# Patient Record
Sex: Male | Born: 1945
Health system: Southern US, Community
[De-identification: ages and names within clinical notes are randomized; demographics above are authoritative.]

## PROBLEM LIST (undated history)

## (undated) DIAGNOSIS — F419 Anxiety disorder, unspecified: Secondary | ICD-10-CM

## (undated) DIAGNOSIS — E785 Hyperlipidemia, unspecified: Secondary | ICD-10-CM

## (undated) DIAGNOSIS — R7303 Prediabetes: Secondary | ICD-10-CM

## (undated) DIAGNOSIS — K589 Irritable bowel syndrome without diarrhea: Secondary | ICD-10-CM

## (undated) DIAGNOSIS — K635 Polyp of colon: Secondary | ICD-10-CM

## (undated) DIAGNOSIS — M1990 Unspecified osteoarthritis, unspecified site: Secondary | ICD-10-CM

## (undated) DIAGNOSIS — K2970 Gastritis, unspecified, without bleeding: Secondary | ICD-10-CM

## (undated) DIAGNOSIS — J449 Chronic obstructive pulmonary disease, unspecified: Secondary | ICD-10-CM

## (undated) DIAGNOSIS — J45909 Unspecified asthma, uncomplicated: Secondary | ICD-10-CM

## (undated) DIAGNOSIS — K5730 Diverticulosis of large intestine without perforation or abscess without bleeding: Secondary | ICD-10-CM

## (undated) DIAGNOSIS — I10 Essential (primary) hypertension: Secondary | ICD-10-CM

## (undated) DIAGNOSIS — I6523 Occlusion and stenosis of bilateral carotid arteries: Secondary | ICD-10-CM

## (undated) HISTORY — PX: EYE SURGERY: SHX253

## (undated) HISTORY — DX: Anxiety disorder, unspecified: F41.9

## (undated) HISTORY — DX: Irritable bowel syndrome, unspecified: K58.9

## (undated) HISTORY — DX: Unspecified osteoarthritis, unspecified site: M19.90

## (undated) HISTORY — DX: Occlusion and stenosis of bilateral carotid arteries: I65.23

## (undated) HISTORY — PX: CATARACT EXTRACTION: SUR2

## (undated) HISTORY — DX: Prediabetes: R73.03

## (undated) HISTORY — PX: TONSILLECTOMY: SUR1361

## (undated) HISTORY — PX: ESOPHAGOGASTRODUODENOSCOPY: SHX1529

## (undated) HISTORY — DX: Polyp of colon: K63.5

## (undated) HISTORY — PX: OTHER SURGICAL HISTORY: SHX169

## (undated) HISTORY — DX: Chronic obstructive pulmonary disease, unspecified: J44.9

## (undated) HISTORY — PX: COLONOSCOPY: SHX174

## (undated) HISTORY — DX: Hyperlipidemia, unspecified: E78.5

## (undated) HISTORY — DX: Diverticulosis of large intestine without perforation or abscess without bleeding: K57.30

## (undated) HISTORY — DX: Gastritis, unspecified, without bleeding: K29.70

---

## 1967-09-30 HISTORY — PX: APPENDECTOMY: SHX54

## 2001-01-07 ENCOUNTER — Encounter: Admission: RE | Admit: 2001-01-07 | Discharge: 2001-01-21 | Payer: Self-pay | Admitting: Family Medicine

## 2001-01-27 HISTORY — PX: OTHER SURGICAL HISTORY: SHX169

## 2001-02-26 ENCOUNTER — Encounter (INDEPENDENT_AMBULATORY_CARE_PROVIDER_SITE_OTHER): Payer: Self-pay | Admitting: *Deleted

## 2001-02-26 ENCOUNTER — Ambulatory Visit (HOSPITAL_BASED_OUTPATIENT_CLINIC_OR_DEPARTMENT_OTHER): Admission: RE | Admit: 2001-02-26 | Discharge: 2001-02-26 | Payer: Self-pay | Admitting: Surgery

## 2005-08-29 HISTORY — PX: OTHER SURGICAL HISTORY: SHX169

## 2005-09-24 ENCOUNTER — Ambulatory Visit (HOSPITAL_COMMUNITY): Admission: RE | Admit: 2005-09-24 | Discharge: 2005-09-25 | Payer: Self-pay | Admitting: Specialist

## 2005-11-03 ENCOUNTER — Encounter: Admission: RE | Admit: 2005-11-03 | Discharge: 2006-01-05 | Payer: Self-pay | Admitting: Specialist

## 2008-01-19 ENCOUNTER — Ambulatory Visit: Payer: Self-pay | Admitting: Internal Medicine

## 2008-02-15 ENCOUNTER — Encounter: Payer: Self-pay | Admitting: Internal Medicine

## 2008-02-15 ENCOUNTER — Ambulatory Visit: Payer: Self-pay | Admitting: Internal Medicine

## 2008-02-18 ENCOUNTER — Encounter: Payer: Self-pay | Admitting: Internal Medicine

## 2008-07-21 ENCOUNTER — Ambulatory Visit: Payer: Self-pay | Admitting: Vascular Surgery

## 2008-08-04 ENCOUNTER — Ambulatory Visit: Payer: Self-pay | Admitting: Vascular Surgery

## 2008-08-16 ENCOUNTER — Ambulatory Visit: Payer: Self-pay | Admitting: Cardiology

## 2009-09-07 ENCOUNTER — Ambulatory Visit: Payer: Self-pay | Admitting: Vascular Surgery

## 2010-06-29 LAB — HM PAP SMEAR

## 2010-10-16 ENCOUNTER — Ambulatory Visit: Admit: 2010-10-16 | Payer: Self-pay | Admitting: Vascular Surgery

## 2011-02-07 ENCOUNTER — Encounter: Payer: Self-pay | Admitting: Family Medicine

## 2011-02-11 NOTE — Procedures (Signed)
CAROTID DUPLEX EXAM   INDICATION:  Carotid bruit, follow up known carotid artery disease.   HISTORY:  Diabetes:  No.  Cardiac:  No.  Hypertension:  No.  Smoking:  Yes.  Previous Surgery:  No.  CV History:  Bilateral carotid.  Previous duplex revealed 20-39% ICA  stenosis bilaterally.  Amaurosis Fugax No, Paresthesias No, Hemiparesis No.                                       RIGHT             LEFT  Brachial systolic pressure:         136               146  Brachial Doppler waveforms:         Biphasic          Triphasic  Vertebral direction of flow:        To and fro        Antegrade  DUPLEX VELOCITIES (cm/sec)  CCA peak systolic                   54                136  ECA peak systolic                   64                121  ICA peak systolic                   <40*              88  ICA end diastolic                   <40               31  PLAQUE MORPHOLOGY:                  Soft              Calcified  PLAQUE AMOUNT:                      Severe            Mild  PLAQUE LOCATION:                    Innominate artery Proximal ICA   IMPRESSION:  1. 407 cm/s peak systolic velocity in the innominate.  2. *The right internal carotid artery has diminished peak systolic      flow.  The right common carotid artery is turbulent.  There is a      left > right brachial gradient, which is consistent with an      innominate artery stenosis.  3. 20-39% left internal carotid artery stenosis.  4. 20-39% right internal carotid artery stenosis.   Dr. Kathi Der office was notified of the preliminary results, and the  patient was set up to see a physician at [facility].   ___________________________________________  Quita Skye Hart [place], M.D.   MC/MEDQ  D:  07/21/2008  T:  07/21/2008  Job:  [id number]

## 2011-02-11 NOTE — Consult Note (Signed)
NEW PATIENT CONSULTATION   Joshua Bowman, Joshua Bowman  DOB:  [birth year]                                       08/04/2008  FIEPP#:[id number]   The patient presents today for evaluation of abnormal extracranial  duplex evaluation.  He is an otherwise healthy 65 year old gentleman who  was noted to have carotid bruit by Dr. Vernon Prey.  He underwent initial  duplex evaluation in our office on 03/26/2005 showing no significant  carotid stenosis.  He had a subsequent followup study on 07/21/2008 and  presents for discussion of continued abnormalities with this.  He  specifically denies any symptoms of amaurosis fugax, transient ischemic  attack, or stroke.  He does not have any posterior circulation symptoms  of dizziness and does not have any arm fatigue with exercise.  He does  have prior rotator cuff repair on the right and does have carpal tunnel  syndrome.   PAST MEDICAL HISTORY:  Significant for elevated cholesterol and COPD.  He is married with 1 child.  He works in Production designer, theatre/television/film.  He does smoke 1/2  pack of cigarettes a day.  Does not drink alcohol on a regular basis.   REVIEW OF SYSTEMS:  Weight is reported at 185.  He is 5 feet 6 inches  tall.  CARDIAC:  Negative.  PULMONARY:  Does have occasional bronchitis.  GASTROINTESTINAL:  Negative for symptoms.  GENITOURINARY:  Negative for symptoms.  ORTHO:  Significant for arthritic joint pain.  He has no psychiatric history.  VASCULAR:  No clotting disorders.   ALLERGIES:  Sulfa.   MEDICATIONS:  Lorazepam 0.5 mg p.o. as needed.  Crestor 20 mg daily.  81  mg aspirin daily.   PHYSICAL EXAM:  Well-developed, well-nourished white male appearing  stated age of 65.  Blood pressure is 148/92 in the left arm, 122/94 in  the right arm.  Pulse is 100.  Respirations 18.  He is grossly intact  neurologically.  he does have palpable radial pulses bilaterally.  Somewhat diminished on the right compared to the left.  Chest is clear.  Heart is regular rate and rhythm.  He does have a soft right carotid  bruit.  I do not appreciate a left carotid bruit.  His femoral,  popliteal, and posterior tibial pulses are 2+ bilaterally.  I do not  palpate any masses in his abdomen, specifically no aneurysm.   I reviewed his duplex with the patient and his family present.  This  appears to show a proximal stenosis, most likely in his innominate  artery.  He does not appear to have any evidence of stenosis in his  carotid arteries bilaterally.  His left carotid study is normal.  I had  a long discussion with the patient and his family.  I explained the  asymptomatic nature of his probable innominate stenosis, I would not  recommend any further followup.  I discussed symptoms of right arm  ischemia and right cerebral disease.  I will see him again in 1 year  with continued followup of this.  If he develops any symptoms, we would  proceed with arteriography for further evaluation.  He is comfortable  with this discussion and will see [place] again in 1 year.   Larina Earthly, M.D.  Electronically Signed   TFE/MEDQ  D:  08/04/2008  T:  08/07/2008  Job:  [id number]  cc:   Ernestina Penna, M.D.

## 2011-02-11 NOTE — Assessment & Plan Note (Signed)
[facility]                            CARDIOLOGY OFFICE NOTE   Bishoy, Cupp SHRIHAN PUTT                       MRN:          [id number]  DATE:08/16/2008                            DOB:          1945-10-19    REFERRING PHYSICIAN:  Ernestina Penna, M.D.   REASON FOR PRESENTATION:  Evaluate the patient with peripheral vascular  disease and multiple cardiovascular risk factors.   HISTORY OF PRESENT ILLNESS:  The patient is a 65 year old gentleman with  history of innominate dominant stenosis followed by Dr. Arbie Cookey.  He has  not ever had coronary artery disease.  His last stress test was in 2002.  Since that time, he has developed some knee problems and is not able to  ambulate on a treadmill.  He is able to do activity such as pushes  lawnmower which he did all summer.  With this, he does not get any chest  pressure, neck, or arm discomfort.  He does not have any palpitation,  presyncope, or syncope.  He gets short of breath with significant  activity, but not with moderate activity.  He does not have any resting  shortness of breath.  He denies any PND or orthopnea.  He is somewhat  limited by bilateral knee pain and so does not exercise routinely.  He  smokes cigarettes.   PAST MEDICAL HISTORY:  Hyperlipidemia, peripheral vascular disease  (innominate stenosis followed by Dr. Arbie Cookey), benign prostatic  hypertrophy, colonic polyps, gastritis, right rotator cuff tear, and  hemorrhoids.   PAST SURGICAL HISTORY:  Appendectomy, nasal surgery, lipoma resected,  and right rotator cuff repair.   ALLERGIES:  SULFA.   MEDICATIONS:  1. Aspirin 81 mg daily.  2. Lorazepam.  3. Vicodin.  4. Crestor 20 mg daily.  5. Voltaren (of note, the patient was given Zetia and has not started      this.  He was not taking his Crestor routinely actually).   SOCIAL HISTORY:  The patient works in Insurance account manager.  He is married.  He  has 1 child.  He has been smoking half pack per  day at least for 20  years.  Quit for a while and actually smoked before this as well.   FAMILY HISTORY:  Noncontributory for early coronary artery disease.  His  mother had arrhythmia and died with a CVA at age 32.   REVIEW OF SYSTEMS:  As stated in the HPI and positive for partial  dentures, apparent early emphysema, reflux, and joint pains.  Negative  for all other systems.   PHYSICAL EXAMINATION:  GENERAL:  The patient is pleasant and in no  distress.  VITAL SIGNS:  Blood pressure 142/92 in the left arm, 114/92 in the right  arm, heart rate 91 and regular, and body mass index 31.  HEENT:  Eyelids unremarkable.  Pupils equal, round, and reactive to  light.  Fundi not visualized.  Oral mucosa unremarkable.  NECK:  Carotid upstroke brisk and symmetric.  Right carotid bruits.  No  thyromegaly.  No jugular venous distension.  LYMPHATICS:  No cervical, axillary, or inguinal adenopathy.  LUNGS:  Clear to auscultation bilaterally.  BACK:  No costovertebral angle tenderness.  CHEST:  Unremarkable.  HEART:  PMI not displaced or sustained.  S1 and S2 within normal limits.  No S3, no S4, no clicks, no rubs, and no murmurs.  ABDOMEN:  Mildly obese.  Positive bowel sounds.  Normal in frequency and  pitch.  No bruits, no rebound, no guarding, no midline pulsatile mass,  no hepatomegaly, and no  splenomegaly.  SKIN:  No rashes and no nodules.  EXTREMITIES:  2+ pulses throughout.  No edema, no cyanosis, and no  clubbing.  NEUROLOGIC:  Oriented to person, place, and time.  Cranial nerves II-XII  grossly intact.  Motor grossly intact.   EKG, sinus rhythm, rate 91, axis within normal limits, intervals within  normal limits, and no acute ST-T-wave changes.   ASSESSMENT AND PLAN:  1. Peripheral vascular disease.  The patient has known peripheral      vascular disease.  This is being followed by the vascular surgeons.      At this point, I do not have a strong suspicion for high-grade       obstructive coronary artery disease.  I do not think stress      perfusion imaging is warranted.  However, I have a strong suspicion      that he would have some plaque in his coronary arteries.      Therefore, I think he would be at significant risk for future      cardiovascular events if he does not take more seriously primary      risk reduction.  We had a long discussion in the office about this.      The individual risk factors are addressed below.  2. Dyslipidemia.  I had a long discussion with him.  I agree with Dr.      Christell Constant in intensive attempts at lipid lowering.  He needs to be on a      statin.  The Zetia would be fine as well.  The goal should be an      LDL at least less than 100 and HDL greater than 40.  Hopefully, he      will comply with medical management and diet.  I suggested a      Mediterranean diet.  3. Tobacco.  We had a long discussion about the need to stop smoking      (greater than 10 minutes).  Hopefully, he will comply with this.  4. Hypertension.  Blood pressure is controlled, and he will continue      medications as listed.  5. Followup.  He should come back to see me with any chest discomfort      or other potential      anginal symptoms and we reviewed all of these.  I have a low      threshold future stress testing.  Again, he needs aggressive      primary risk reduction.     Rollene Rotunda, MD, [facility]  Electronically Signed    JH/MedQ  DD: 08/16/2008  DT: 08/17/2008  Job #: [id number]   cc:   Ernestina Penna, M.D.

## 2011-02-11 NOTE — Procedures (Signed)
CAROTID DUPLEX EXAM   INDICATION:  Follow-up evaluation of carotid artery disease.   HISTORY:  Diabetes:  No.  Cardiac:  No.  Hypertension:  No.  Smoking:  Yes.  Previous Surgery:  No.  CV History:  No.  Amaurosis Fugax No, Paresthesias No, Hemiparesis No.                                       RIGHT             LEFT  Brachial systolic pressure:         130               154  Brachial Doppler waveforms:         Within normal limits                Within normal limits  Vertebral direction of flow:        Retrograde        Antegrade  DUPLEX VELOCITIES (cm/sec)  CCA peak systolic                   75                131  ECA peak systolic                   45                138  ICA peak systolic                   <40               85  ICA end diastolic                   <40               26  PLAQUE MORPHOLOGY:                  Soft  PLAQUE AMOUNT:                      Severe  PLAQUE LOCATION:                    Innominate artery   IMPRESSION:  1. There are increased velocities of 262 cm/s in the right innominate      artery.  2. There are diminished waveforms in the right internal carotid      artery.  3. Unable to evaluate the right internal carotid artery stenosis due      to diminished peak systolic flow.  4. Left internal carotid artery suggests 1-39% stenosis.  5. There is retrograde flow in the right vertebral; however, there is      antegrade flow in the left vertebral.   ___________________________________________  Larina Earthly, M.D.   CB/MEDQ  D:  09/07/2009  T:  09/08/2009  Job:  [id number]

## 2011-02-14 NOTE — Op Note (Signed)
Joshua Bowman, Joshua Bowman                ACCOUNT NO.:  [id number]   MEDICAL RECORD NO.:  [id number]          PATIENT TYPE:  AMB   LOCATION:  DAY                          FACILITY:  [facility]   PHYSICIAN:  Jene Every, M.D.    DATE OF BIRTH:  Sep 23, 1946   DATE OF PROCEDURE:  09/25/2005  DATE OF DISCHARGE:                                 OPERATIVE REPORT   PREOPERATIVE DIAGNOSIS:  Massive rotator cuff tear of the right shoulder.   POSTOPERATIVE DIAGNOSIS:  Massive rotator cuff tear of the right shoulder.   PROCEDURE PERFORMED:  1.  Open acromioplasty.  2.  Repair of massive rotator cuff tear, utilizing Mitek suture anchors and      DePuy Restore patch graft.   ANESTHESIA:  General.   ASSISTANT:  Roma Schanz, P.A.   BRIEF HISTORY/INDICATIONS:  This is a 65 year old with shoulder pain.  MRI  indicating retracted rotator cuff tear, complete on supraspinatus and  infraspinatus.  Prevention was indicated for repair of the cuff,  decompression of the subacromial area.  Risks and benefits were discussed,  including bleeding, infection, damage to vascular structures, no changes in  symptoms, worsening symptoms, need for repeat repair, arthroplasty, etc.  Also, utilization of the patch graft, etc.   TECHNIQUE:  With the patient in the supine beach chair position, after the  induction of adequate general anesthesia and 1 gm of Kefzol, the right  shoulder and upper extremity were prepped and draped in the usual sterile  fashion.  He had good range of motion of the shoulder.  IV antibiotics were  given.  The right shoulder and upper extremity was prepped and draped in the  usual sterile fashion.  An incision was made over the anterior aspect of the  acromion in Langer's line.  Subcutaneous tissue was dissected, and  electrocautery was utilized to achieve hemostasis.  The raphe between the  anterolateral heads of the deltoid were divided, approximately 3 cm in  length, subperiosteally  elevated from the anterolateral aspect of the  acromion and medially.  The __________ ligament was detached and excised.  Bursal fluid was irrigated.  Hypertrophic synovitis was removed.  A  bursectomy was performed.  A complete retracted tear of the rotator cuff,  supraspinatus, and infraspinatus was noted.  I lavaged the glenohumeral  joint.  With an Allis, I captured the leading edge of the supraspinatus,  gently retracted it laterally.  With the Cobb elevator, gently mobilized the  rotator cuff.  We did not mobilize it more than 2 cm.  Used a Cobb to  slightly elevate underneath the cuff at the top of the glenoid.  We  digitally mobilized the cuff as well.  Used an oscillating saw to remove the  anterior inferior aspect of the acromion.  He apparently had an os  acromiale, which was not unstable.  We stayed well away from that.  Immobilized what was left of the infraspinatus.  There was more of a  complete tear than this and retraction in the supraspinatus and into the  subs cap.  I then advanced it.  I  had to make a trough in the greater  tuberosity and remove approximately 1 cm of the articular surface laterally,  such that I felt there was adequate cancellous bone bed for the in-growth of  the rotator cuff.  The three Mitek suture anchors, one anterolaterally, one  laterally, and one posterolaterally.  I then put one out further laterally  that I then used in a horizontal mattress type through the leading edge of  the supraspinatus, such that would pull it out laterally and mobilize the  cuff for [place].  I then put the anterolateral through the tendon as well.  A  deficiency posterolaterally noted.  With the advancing supraspinatus and the  arm abducted to approximately 30 degrees, I secured the anterolateral and  the lateral sutures attached to the anchor.  The anchors were placed and  good bone attested, and there was no pullout.  I did then feel there was  some attenuation of the  whole cuff complex.  After securing it with the  Ethibond sutures, the deficiency laterally felt it would be appropriate to  augment that with a DePuy restorer patch graft.  I then secured the patch  graft and soaked it for five minutes, as recommended, folded it over, and  contoured it to the cuff, that was noted.  Basically placed the patch graft  over the residual of the Ethibond suture and slid it down to on top of the  tendon, and that covered the foot plate laterally and posterolaterally.  Sutured the patch graft to the tendon with 3-0 Vicryl interrupted sutures.  The graft patch was placed under tension as well.  Good coverage was noted  following utilization of the patch graft.  Good range of motion, full  coverage was noted.  The wound was copiously irrigated.  Again, I felt there  was satisfactory coverage of the head, utilizing the patch graft and native  residual rotator cuff.  Next, the wound was copiously irrigated.  The raphe  repaired with #1 Vicryl interrupted figure-of-eight sutures to a good cuff.  The subcutaneous tissue reapproximated with 2-0 Vicryl simple sutures.  The  skin was reapproximated with 4-0 subcuticular Prolene.  The wound was  reinforced with Steri-Strips.  Sterile dressing was applied.  Placed in an  abduction pillow.  Extubated without difficulty.  Transported to the  recovery room in satisfactory condition.   Patient tolerated the procedure well with no complications.      Jene Every, M.D.  Electronically Signed     JB/MEDQ  D:  09/24/2005  T:  09/24/2005  Job:  [id number]

## 2011-08-27 ENCOUNTER — Ambulatory Visit (INDEPENDENT_AMBULATORY_CARE_PROVIDER_SITE_OTHER): Payer: Medicare Other | Admitting: *Deleted

## 2011-08-27 DIAGNOSIS — I6529 Occlusion and stenosis of unspecified carotid artery: Secondary | ICD-10-CM

## 2011-09-02 NOTE — Procedures (Unsigned)
CAROTID DUPLEX EXAM  INDICATION:  Follow up carotid/innominate disease.  History, right bruit.  HISTORY: Diabetes:  No. Cardiac:  No. Hypertension:  No. Smoking:  Yes. Previous Surgery:  No. CV History: Amaurosis Fugax No, Paresthesias No, Hemiparesis No.                                      RIGHT             LEFT Brachial systolic pressure:         128               174 Brachial Doppler waveforms:         Dampened          Within normal limits Vertebral direction of flow:        Bidirectional     Antegrade DUPLEX VELOCITIES (cm/sec) CCA peak systolic                   58                119 ECA peak systolic                   67                152 ICA peak systolic                   27                69 ICA end diastolic                   12                25 PLAQUE MORPHOLOGY:                  NV                Heterogenous PLAQUE AMOUNT:                      Severe            Minimal PLAQUE LOCATION:                    Innominate        ICA  IMPRESSION: 1. Minimal plaquing of the bilateral internal carotid arteries. 2. Severe stenosis of innominate artery suspected. 3. Grossly abnormal waveforms in the right internal carotid artery and     common carotid artery with mid systolic deceleration which may     preclude steal syndrome. 4. Moderate stenosis of the right subclavian artery is observed. 5. Bidirectional right vertebral artery. 6. Left subclavian and vertebral arteries are within normal limits. 7. There is a significant brachial blood pressure gradient, left     greater than right.  ___________________________________________ Larina Earthly, M.D.  LT/MEDQ  D:  08/27/2011  T:  08/27/2011  Job:  [id number]

## 2012-03-18 ENCOUNTER — Encounter: Payer: Self-pay | Admitting: Internal Medicine

## 2012-04-13 ENCOUNTER — Ambulatory Visit (AMBULATORY_SURGERY_CENTER): Payer: Medicare Other | Admitting: *Deleted

## 2012-04-13 ENCOUNTER — Encounter: Payer: Self-pay | Admitting: Internal Medicine

## 2012-04-13 VITALS — Ht 66.0 in | Wt 188.0 lb

## 2012-04-13 DIAGNOSIS — Z1211 Encounter for screening for malignant neoplasm of colon: Secondary | ICD-10-CM

## 2012-04-13 MED ORDER — MOVIPREP 100 G PO SOLR: ORAL | Status: DC

## 2012-04-27 ENCOUNTER — Encounter: Payer: Medicare Other | Admitting: Internal Medicine

## 2012-05-06 ENCOUNTER — Encounter: Payer: Self-pay | Admitting: Internal Medicine

## 2012-05-06 ENCOUNTER — Ambulatory Visit (AMBULATORY_SURGERY_CENTER): Payer: Medicare Other | Admitting: Internal Medicine

## 2012-05-06 VITALS — BP 146/83 | HR 106 | Temp 98.6°F | Resp 15 | Ht 66.0 in | Wt 188.0 lb

## 2012-05-06 DIAGNOSIS — D126 Benign neoplasm of colon, unspecified: Secondary | ICD-10-CM

## 2012-05-06 DIAGNOSIS — Z1211 Encounter for screening for malignant neoplasm of colon: Secondary | ICD-10-CM

## 2012-05-06 DIAGNOSIS — Z860100 Personal history of colon polyps, unspecified: Secondary | ICD-10-CM | POA: Insufficient documentation

## 2012-05-06 MED ORDER — SODIUM CHLORIDE 0.9 % IV SOLN: 500.0000 mL | INTRAVENOUS | Status: DC

## 2012-05-06 NOTE — Patient Instructions (Addendum)
Seven (7) polyps were removed today. One tiny polyp was not recovered but the others were removed and sent to pathology. They all look benign and the largest was 7 mm. I will send a letter about the results and when to have another colonoscopy.  Thank you for choosing me and [facility].  Iva Boop, MD, FACG   YOU HAD AN ENDOSCOPIC PROCEDURE TODAY AT THE [facility]: Refer to the procedure report that was given to you for any specific questions about what was found during the examination.  If the procedure report does not answer your questions, please call your gastroenterologist to clarify.  If you requested that your care partner not be given the details of your procedure findings, then the procedure report has been included in a sealed envelope for you to review at your convenience later.  YOU SHOULD EXPECT: Some feelings of bloating in the abdomen. Passage of more gas than usual.  Walking can help get rid of the air that was put into your GI tract during the procedure and reduce the bloating. If you had a lower endoscopy (such as a colonoscopy or flexible sigmoidoscopy) you may notice spotting of blood in your stool or on the toilet paper. If you underwent a bowel prep for your procedure, then you may not have a normal bowel movement for a few days.  DIET: Your first meal following the procedure should be a light meal and then it is ok to progress to your normal diet.  A half-sandwich or bowl of soup is an example of a good first meal.  Heavy or fried foods are harder to digest and may make you feel nauseous or bloated.  Likewise meals heavy in dairy and vegetables can cause extra gas to form and this can also increase the bloating.  Drink plenty of fluids but you should avoid alcoholic beverages for 24 hours.  ACTIVITY: Your care partner should take you home directly after the procedure.  You should plan to take it easy, moving slowly for the rest of the day.  You  can resume normal activity the day after the procedure however you should NOT DRIVE or use heavy machinery for 24 hours (because of the sedation medicines used during the test).    SYMPTOMS TO REPORT IMMEDIATELY: A gastroenterologist can be reached at any hour.  During normal business hours, 8:30 AM to 5:00 PM Monday through Friday, call [phone].  After hours and on weekends, please call the GI answering service at [phone] who will take a message and have the physician on call contact you.   Following lower endoscopy (colonoscopy or flexible sigmoidoscopy):  Excessive amounts of blood in the stool  Significant tenderness or worsening of abdominal pains  Swelling of the abdomen that is new, acute  Fever of 100F or higher  Following upper endoscopy (EGD)  Vomiting of blood or coffee ground material  New chest pain or pain under the shoulder blades  Painful or persistently difficult swallowing  New shortness of breath  Fever of 100F or higher  Black, tarry-looking stools  FOLLOW UP: If any biopsies were taken you will be contacted by phone or by letter within the next 1-3 weeks.  Call your gastroenterologist if you have not heard about the biopsies in 3 weeks.  Our staff will call the home number listed on your records the next business day following your procedure to check on you and address any questions or concerns that you may  have at that time regarding the information given to you following your procedure. This is a courtesy call and so if there is no answer at the home number and we have not heard from you through the emergency physician on call, we will assume that you have returned to your regular daily activities without incident.  SIGNATURES/CONFIDENTIALITY: You and/or your care partner have signed paperwork which will be entered into your electronic medical record.  These signatures attest to the fact that that the information above on your After Visit Summary has  been reviewed and is understood.  Full responsibility of the confidentiality of this discharge information lies with you and/or your care-partner.   Polyp information given.  Next colonoscopy will be scheduled after Dr. Leone Payor reviews you pathology report for polyps. He will advise.

## 2012-05-06 NOTE — Op Note (Signed)
[facility] [address] [place], [place]  [address]  COLONOSCOPY PROCEDURE REPORT  PATIENT:  Joshua Bowman, Joshua Bowman  MR#:  [id number] BIRTHDATE:  February 28, 1946, 66 yrs. old  GENDER:  male ENDOSCOPIST:  Iva Boop, MD, [facility]  PROCEDURE DATE:  05/06/2012 PROCEDURE:  Colonoscopy with biopsy and snare polypectomy ASA CLASS:  Class III INDICATIONS:  surveillance and high-risk screening, history of pre-cancerous (adenomatous) colon polyps advanced adenomas 2004 and 2009 MEDICATIONS:   These medications were titrated to patient response per physician's verbal order, MAC sedation, administered by CRNA, propofol (Diprivan) 240 mg IV  DESCRIPTION OF PROCEDURE:   After the risks benefits and alternatives of the procedure were thoroughly explained, informed consent was obtained.  Digital rectal exam was performed and revealed no abnormalities and normal prostate.   The LB [id number]-[id number] endoscope was introduced through the anus and advanced to the cecum, which was identified by both the appendix and ileocecal valve, without limitations.  The quality of the prep was excellent, using MoviPrep.  The instrument was then slowly withdrawn as the colon was fully examined. <<PROCEDUREIMAGES>>  FINDINGS:  There were multiple polyps identified and removed. Seven (7) polyps removed using cold snare and cold biopsy. Ascending, transverse, splenic flexure and descending colon polyps (4). Largest 7 mm splenic flexure and smallest 2-3 mm descending polyp.One diminutive descending polyp was not recovered but the others were. This was otherwise a normal examination of the colon. Includes right colon retroflexion.   Retroflexed views in the rectum revealed no abnormalities.    The time to cecum = 1:57 minutes. The scope was then withdrawn in 17:30 minutes from the cecum and the procedure completed. COMPLICATIONS:  None ENDOSCOPIC IMPRESSION: 1) Polyps 7 removed and 6 recovered, largest 7 mm 2)  Otherwise normal examination, excellent prep 3) Prior advanced adenomas 2004 and 2009  REPEAT EXAM:  In for Colonoscopy, pending biopsy results.  Iva Boop, MD, [facility]  CC:  Rudi Heap, MD and The Patient  n. eSIGNED:   Iva Boop at 05/06/2012 03:16 PM  Frances Maywood, [phone]

## 2012-05-06 NOTE — Progress Notes (Signed)
Patient did not experience any of the following events: a burn prior to discharge; a fall within the facility; wrong site/side/patient/procedure/implant event; or a hospital transfer or hospital admission upon discharge from the facility. (G8907) Patient did not have preoperative order for IV antibiotic SSI prophylaxis. (G8918)  

## 2012-05-07 ENCOUNTER — Telehealth: Payer: Self-pay | Admitting: *Deleted

## 2012-05-07 NOTE — Telephone Encounter (Signed)
  Follow up Call-  Call back number 05/06/2012  Post procedure Call Back phone  # [phone]  Permission to leave phone message Yes     Patient questions:  Do you have a fever, pain , or abdominal swelling? no Pain Score  0 *  Have you tolerated food without any problems? yes  Have you been able to return to your normal activities? yes  Do you have any questions about your discharge instructions: Diet   no Medications  no Follow up visit  no  Do you have questions or concerns about your Care? no  Actions: * If pain score is 4 or above: No action needed, pain <4.

## 2012-05-12 ENCOUNTER — Encounter: Payer: Self-pay | Admitting: Internal Medicine

## 2012-05-12 NOTE — Progress Notes (Signed)
Quick Note:  6 tubular adenomas, 7th not recovered Repeat colonoscopy about 04/2015 ______

## 2012-07-22 ENCOUNTER — Encounter: Payer: Self-pay | Admitting: Cardiology

## 2012-10-19 ENCOUNTER — Other Ambulatory Visit: Payer: Self-pay | Admitting: Family Medicine

## 2012-10-19 DIAGNOSIS — J438 Other emphysema: Secondary | ICD-10-CM

## 2012-10-21 ENCOUNTER — Ambulatory Visit (HOSPITAL_COMMUNITY)
Admission: RE | Admit: 2012-10-21 | Discharge: 2012-10-21 | Disposition: A | Discharge status: Home / Self Care | Payer: Medicare Other | Source: Ambulatory Visit | Attending: Family Medicine | Admitting: Family Medicine

## 2012-10-21 DIAGNOSIS — R0602 Shortness of breath: Secondary | ICD-10-CM | POA: Insufficient documentation

## 2012-10-21 DIAGNOSIS — J438 Other emphysema: Secondary | ICD-10-CM

## 2012-10-21 DIAGNOSIS — R911 Solitary pulmonary nodule: Secondary | ICD-10-CM | POA: Insufficient documentation

## 2012-12-28 ENCOUNTER — Other Ambulatory Visit: Payer: Self-pay

## 2012-12-28 DIAGNOSIS — M1710 Unilateral primary osteoarthritis, unspecified knee: Secondary | ICD-10-CM

## 2012-12-28 MED ORDER — HYDROCODONE-ACETAMINOPHEN 5-325 MG PO TABS: 1.0000 | ORAL_TABLET | Freq: Four times a day (QID) | ORAL | Status: DC | PRN

## 2012-12-28 NOTE — Telephone Encounter (Signed)
Last filled 11/24/12  Last seen 11/24/12    Print rx and have nurse call patient to pick up

## 2013-01-24 ENCOUNTER — Other Ambulatory Visit: Payer: Self-pay | Admitting: Family Medicine

## 2013-03-04 ENCOUNTER — Other Ambulatory Visit: Payer: Self-pay | Admitting: *Deleted

## 2013-03-04 NOTE — Telephone Encounter (Signed)
Patient last seen in office on 11-24-12. Rx last filled on 01-30-13. Please advise. If approved please have nurse phone in the [facility]

## 2013-03-05 MED ORDER — LORAZEPAM 0.5 MG PO TABS: 0.5000 mg | ORAL_TABLET | Freq: Every day | ORAL | Status: DC | PRN

## 2013-03-12 NOTE — Telephone Encounter (Signed)
Called in 03/12/13 by [facility]

## 2013-03-30 ENCOUNTER — Encounter: Payer: Self-pay | Admitting: Family Medicine

## 2013-03-30 ENCOUNTER — Ambulatory Visit (INDEPENDENT_AMBULATORY_CARE_PROVIDER_SITE_OTHER): Payer: Medicare Other | Admitting: Family Medicine

## 2013-03-30 VITALS — BP 156/86 | HR 103 | Temp 97.5°F | Ht 64.0 in | Wt 185.0 lb

## 2013-03-30 DIAGNOSIS — R718 Other abnormality of red blood cells: Secondary | ICD-10-CM

## 2013-03-30 DIAGNOSIS — I10 Essential (primary) hypertension: Secondary | ICD-10-CM | POA: Insufficient documentation

## 2013-03-30 DIAGNOSIS — J9801 Acute bronchospasm: Secondary | ICD-10-CM

## 2013-03-30 DIAGNOSIS — M1990 Unspecified osteoarthritis, unspecified site: Secondary | ICD-10-CM | POA: Insufficient documentation

## 2013-03-30 DIAGNOSIS — E559 Vitamin D deficiency, unspecified: Secondary | ICD-10-CM

## 2013-03-30 DIAGNOSIS — E785 Hyperlipidemia, unspecified: Secondary | ICD-10-CM | POA: Insufficient documentation

## 2013-03-30 DIAGNOSIS — D751 Secondary polycythemia: Secondary | ICD-10-CM

## 2013-03-30 LAB — POCT CBC
Lymph, poc: 2.8 (ref 0.6–3.4)
MCH, POC: 31.1 pg (ref 27–31.2)
MCHC: 34.6 g/dL (ref 31.8–35.4)
MCV: 89.8 fL (ref 80–97)
MPV: 8.2 fL (ref 0–99.8)
POC LYMPH PERCENT: 27.8 %L (ref 10–50)
Platelet Count, POC: 226 10*3/uL (ref 142–424)
RDW, POC: 13.9 %
WBC: 9.9 10*3/uL (ref 4.6–10.2)

## 2013-03-30 LAB — BASIC METABOLIC PANEL WITH GFR
BUN: 14 mg/dL (ref 6–23)
Calcium: 9.7 mg/dL (ref 8.4–10.5)
Creat: 1.05 mg/dL (ref 0.50–1.35)
GFR, Est Non African American: 73 mL/min

## 2013-03-30 LAB — HEPATIC FUNCTION PANEL
Albumin: 4.1 g/dL (ref 3.5–5.2)
Alkaline Phosphatase: 45 U/L (ref 39–117)
Total Bilirubin: 0.5 mg/dL (ref 0.3–1.2)
Total Protein: 6.6 g/dL (ref 6.0–8.3)

## 2013-03-30 MED ORDER — LORAZEPAM 0.5 MG PO TABS: 0.5000 mg | ORAL_TABLET | Freq: Every day | ORAL | Status: DC | PRN

## 2013-03-30 MED ORDER — BUDESONIDE-FORMOTEROL FUMARATE 160-4.5 MCG/ACT IN AERO: 2.0000 | INHALATION_SPRAY | Freq: Two times a day (BID) | RESPIRATORY_TRACT | Status: DC

## 2013-03-30 NOTE — Progress Notes (Deleted)
  Subjective:    Patient ID: Joshua Bowman, male    DOB: March 04, 1946, 67 y.o.   MRN: [id number]  HPI    Review of Systems     Objective:   Physical Exam        Assessment & Plan:

## 2013-03-30 NOTE — Addendum Note (Signed)
Addended by: Tommas Olp on: 03/30/2013 11:30 AM   Modules accepted: Orders

## 2013-03-30 NOTE — Patient Instructions (Addendum)
Continue current medications. Continue aggressive therapeutic lifestyle changes with diet and exercise Be careful not to climb and risk of falling Avoid irritating environments as much is possible

## 2013-03-30 NOTE — Progress Notes (Addendum)
  Subjective:    Patient ID: Joshua Bowman, male    DOB: Aug 07, 1946, 67 y.o.   MRN: [id number]  HPI Patient returns to clinic for followup of chronic medical problems. These include hyperlipidemia hypertension vitamin D deficiency. 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 His health maintenance indicates he may be due a Tdap. He also has a history of mild chronic anxiety. His PSA was done in February. He uses a rescue inhaler at night periodically for any wheezing.  Review of Systems  Constitutional: Positive for fatigue.  HENT: Positive for postnasal drip (slight,due to allergies). Negative for sneezing.   Eyes: Positive for discharge (L eye). Negative for itching.  Respiratory: Positive for shortness of breath (with over exertion) and wheezing (slight at night).   Cardiovascular: Negative.   Gastrointestinal: Negative.   Endocrine: Negative.   Genitourinary: Positive for frequency (at night).  Musculoskeletal: Positive for back pain (LBP), joint swelling (occasional knees) and arthralgias (bilateral knees, L>R, intermitent).  Skin: Negative.   Allergic/Immunologic: Positive for environmental allergies (seasonal).  Neurological: Negative.   Psychiatric/Behavioral: Positive for sleep disturbance (to urinate). The patient is nervous/anxious (slight).    home blood pressure readings were reviewed and all of these are good.     Objective:   Physical Exam BP 156/86  Pulse 103  Temp(Src) 97.5 F (36.4 C) (Oral)  Ht 5\' 4"  (1.626 m)  Wt 185 lb (83.915 kg)  BMI 31.74 kg/m2  The patient appeared well nourished and normally developed, alert and oriented to time and place. Speech, behavior and judgement appear normal. Vital signs as documented.  Head exam is unremarkable. No scleral icterus or pallor noted. There is nasal congestion right greater than left. Neck is without  jugular venous distension, thyromegally, or carotid bruits. Carotid upstrokes are brisk bilaterally. No cervical adenopathy. Lungs have wheezes bilaterally especially on inspiration. Normal respiratory effort. There was a tight cough. Cardiac exam reveals regular rate and rhythm at 72 per minute. First and second heart sounds normal.  No murmurs, rubs or gallops.  Abdominal exam reveals normal bowl sounds, no masses, no organomegaly and no aortic enlargement. No inguinal adenopathy. There is no abdominal tenderness. Extremities are nonedematous and both femoral and pedal pulses are normal. He does have a prominent bunion of the left foot. Skin without pallor or jaundice.  Warm and dry, without rash. Neurologic exam reveals normal deep tendon reflexes and normal sensation.  Repeat blood pressure by nurse 154/94 and pulse of 101        Assessment & Plan:  1. Elevated red blood cell count - POCT CBC; Standing  2. Hypertension -Continue to monitor blood pressures at home - BASIC METABOLIC PANEL WITH GFR; Standing  3. Vitamin D deficiency - Vitamin D 25 hydroxy; Standing  4. Hyperlipemia - Hepatic function panel; Standing - NMR Lipoprofile with Lipids; Standing  5. Bronchospasm - budesonide-formoterol (SYMBICORT) 160-4.5 MCG/ACT inhaler; Inhale 2 puffs into the lungs 2 (two) times daily. rinse mouth after use  Dispense: 1 Inhaler; Refill: 3  Patient Instructions  Continue current medications. Continue aggressive therapeutic lifestyle changes with diet and exercise Be careful not to climb and risk of falling Avoid irritating environments as much is possible

## 2013-03-31 LAB — NMR LIPOPROFILE WITH LIPIDS
HDL Particle Number: 35.6 umol/L (ref >=30.5)
HDL Size: 9.7 nm (ref >=9.2)
HDL-C: 44 mg/dL (ref >=40)
LDL (calc): 47 mg/dL (ref <100)
LDL Size: 19.8 nm — ABNORMAL LOW (ref >20.5)
LP-IR Score: 33 (ref <=45)
Large HDL-P: 5.6 umol/L (ref >=4.8)
Small LDL Particle Number: 331 nmol/L (ref <=527)

## 2013-03-31 LAB — VITAMIN D 25 HYDROXY (VIT D DEFICIENCY, FRACTURES): Vit D, 25-Hydroxy: 49 ng/mL (ref 30–89)

## 2013-04-13 ENCOUNTER — Telehealth: Payer: Self-pay | Admitting: *Deleted

## 2013-04-13 NOTE — Telephone Encounter (Signed)
Message copied by Bearl Mulberry on Wed Apr 13, 2013  7:12 PM ------      Message from: Ernestina Penna      Created: Thu Mar 31, 2013 12:37 PM       The electrolytes were normal. The kidney function test or creatinine was also within normal limits. The fasting blood sugar was 105 which is slightly elevated.      All liver function tests were within normal limits.      Vitamin D was good at 49.      The total LDL particle number was excellent!!  Triglycerides were good the small LDL particle number was low and the good HDL particle number was high. Congratulations!!      Continue current treatment and aggressive therapeutic lifestyle changes       ------

## 2013-04-13 NOTE — Telephone Encounter (Signed)
Pt notified of results

## 2013-05-31 ENCOUNTER — Other Ambulatory Visit: Payer: Self-pay | Admitting: Family Medicine

## 2013-07-19 ENCOUNTER — Other Ambulatory Visit: Payer: Self-pay

## 2013-07-19 DIAGNOSIS — M1710 Unilateral primary osteoarthritis, unspecified knee: Secondary | ICD-10-CM

## 2013-07-19 MED ORDER — HYDROCODONE-ACETAMINOPHEN 5-325 MG PO TABS: 1.0000 | ORAL_TABLET | Freq: Four times a day (QID) | ORAL | Status: DC | PRN

## 2013-07-19 NOTE — Telephone Encounter (Signed)
Last seen 03/30/13  DWM  If approved print and route to nurse 

## 2013-07-19 NOTE — Telephone Encounter (Signed)
Pt called and aware to pick up rx

## 2013-07-19 NOTE — Telephone Encounter (Signed)
This is okay to refill. Tell patient to take as few pills daily as possible

## 2013-08-17 ENCOUNTER — Encounter: Payer: Self-pay | Admitting: Family Medicine

## 2013-08-17 ENCOUNTER — Ambulatory Visit (INDEPENDENT_AMBULATORY_CARE_PROVIDER_SITE_OTHER): Payer: Medicare Other | Admitting: Family Medicine

## 2013-08-17 VITALS — BP 140/90 | HR 108 | Temp 97.0°F | Ht 64.0 in | Wt 189.0 lb

## 2013-08-17 DIAGNOSIS — E559 Vitamin D deficiency, unspecified: Secondary | ICD-10-CM

## 2013-08-17 DIAGNOSIS — E785 Hyperlipidemia, unspecified: Secondary | ICD-10-CM

## 2013-08-17 DIAGNOSIS — R9389 Abnormal findings on diagnostic imaging of other specified body structures: Secondary | ICD-10-CM

## 2013-08-17 DIAGNOSIS — I10 Essential (primary) hypertension: Secondary | ICD-10-CM

## 2013-08-17 DIAGNOSIS — M1990 Unspecified osteoarthritis, unspecified site: Secondary | ICD-10-CM

## 2013-08-17 DIAGNOSIS — J069 Acute upper respiratory infection, unspecified: Secondary | ICD-10-CM

## 2013-08-17 DIAGNOSIS — J209 Acute bronchitis, unspecified: Secondary | ICD-10-CM

## 2013-08-17 LAB — POCT CBC
Lymph, poc: 2.1 (ref 0.6–3.4)
MCH, POC: 28.8 pg (ref 27–31.2)
MCHC: 32.2 g/dL (ref 31.8–35.4)
MPV: 7.7 fL (ref 0–99.8)
POC Granulocyte: 5.6 (ref 2–6.9)
POC LYMPH PERCENT: 26.7 %L (ref 10–50)
Platelet Count, POC: 213 10*3/uL (ref 142–424)
RBC: 6.2 M/uL — AB (ref 4.69–6.13)

## 2013-08-17 MED ORDER — AZITHROMYCIN 250 MG PO TABS: ORAL_TABLET | ORAL | Status: DC

## 2013-08-17 NOTE — Patient Instructions (Addendum)
Continue current medications. Continue good therapeutic lifestyle changes which include good diet and exercise. Fall precautions discussed with patient. Check on cost of TDAP when you come back for the Prevnar 13 (baby pneumonia shot). If you are over 67 years old - you may need Prevnar 13 or the adult Pneumonia vaccine. Drink plenty of fluids Take Mucinex maximum strength, blue and white in color, over-the-counter, one twice daily with a large glass of water Take antibiotic as directed Use saline nose spray Use inhalers regularly

## 2013-08-17 NOTE — Progress Notes (Signed)
Subjective:    Patient ID: Joshua Bowman, male    DOB: [birth year], 67 y.o.   MRN: [id number]  HPI Pt here for follow up and management of chronic medical problems. All of his outside blood pressure readings are good . On health maintenance he is due to have a Tdap and a Prevnar. He continues to have joint problems especially in his knees. He also complains of cough and congestion for the past 3 days. It started in his chest and now has moved more to his head.     Patient Active Problem List   Diagnosis Date Noted  . Hypertension 03/30/2013  . Degenerative arthritis 03/30/2013  . Hyperlipidemia 03/30/2013  . Personal history of adenomatous colonic polyps 05/06/2012   Outpatient Encounter Prescriptions as of 08/17/2013  Medication Sig  . aspirin 325 MG tablet Take 325 mg by mouth daily.  . Cholecalciferol (VITAMIN D3) 5000 UNITS CAPS Take 1 tablet by mouth. 2-3 x week  . ezetimibe (ZETIA) 10 MG tablet   . HYDROcodone-acetaminophen (NORCO/VICODIN) 5-325 MG per tablet Take 1 tablet by mouth every 6 (six) hours as needed for pain.  Marland Kitchen LORazepam (ATIVAN) 0.5 MG tablet Take 1 tablet (0.5 mg total) by mouth daily as needed.  Marland Kitchen PROAIR HFA 108 (90 BASE) MCG/ACT inhaler INHALE ONE OR TWO PUFFS BY MOUTH FOUR TIMES DAILY AS NEEDED  . rosuvastatin (CRESTOR) 40 MG tablet Take 20 mg by mouth daily.   . budesonide-formoterol (SYMBICORT) 160-4.5 MCG/ACT inhaler Inhale 2 puffs into the lungs 2 (two) times daily. rinse mouth after use    Review of Systems  Constitutional: Negative.   HENT: Positive for congestion and postnasal drip. Negative for ear pain and sore throat.   Eyes: Negative.   Respiratory: Positive for cough (little) and wheezing.   Cardiovascular: Negative.   Gastrointestinal: Negative.   Endocrine: Negative.   Genitourinary: Negative.   Musculoskeletal: Positive for arthralgias (left knee pain) and back pain.  Skin: Negative.   Allergic/Immunologic: Negative.   Neurological:  Negative.   Hematological: Negative.   Psychiatric/Behavioral: Negative.        Objective:   Physical Exam  Nursing note and vitals reviewed. Constitutional: He is oriented to person, place, and time. He appears well-developed and well-nourished. No distress.  HENT:  Head: Normocephalic and atraumatic.  Right Ear: External ear normal.  Left Ear: External ear normal.  Mouth/Throat: Oropharynx is clear and moist. No oropharyngeal exudate.  Nasal congestion bilaterally  Eyes: Conjunctivae and EOM are normal. Pupils are equal, round, and reactive to light. Right eye exhibits no discharge. Left eye exhibits no discharge. No scleral icterus.  Neck: Normal range of motion. Neck supple. No tracheal deviation present. No thyromegaly present.  No carotid bruits  Cardiovascular: Normal rate, regular rhythm, normal heart sounds and intact distal pulses.  Exam reveals no gallop and no friction rub.   No murmur heard. At 96 per minute  Pulmonary/Chest: Effort normal. No respiratory distress. He has wheezes. He has no rales. He exhibits no tenderness.  Right upper airway cough and scattered wheezes  Abdominal: Soft. Bowel sounds are normal. He exhibits no mass. There is no tenderness. There is no rebound and no guarding.  Musculoskeletal: Normal range of motion. He exhibits no edema and no tenderness.  Bilateral knee joint tenderness and stiffness  Lymphadenopathy:    He has no cervical adenopathy.  Neurological: He is alert and oriented to person, place, and time. He has normal reflexes. No cranial nerve deficit.  Skin:  Skin is warm and dry. No rash noted. No erythema. No pallor.  Psychiatric: He has a normal mood and affect. His behavior is normal. Judgment and thought content normal.   BP 140/90  Pulse 108  Temp(Src) 97 F (36.1 C) (Oral)  Ht 5\' 4"  (1.626 m)  Wt 189 lb (85.73 kg)  BMI 32.43 kg/m2        Assessment & Plan:   1. Acute bronchitis   2. Hyperlipidemia   3.  Hypertension   4. Degenerative arthritis   5. Vitamin D deficiency   6. Abnormal chest CT   7. URI (upper respiratory infection)    Orders Placed This Encounter  Procedures  . Hepatic function panel  . BMP8+EGFR  . NMR, lipoprofile  . Vit D  25 hydroxy (rtn osteoporosis monitoring)  . POCT CBC   Meds ordered this encounter  Medications  . azithromycin (ZITHROMAX) 250 MG tablet    Sig: 2 tablets for the first day then 1 daily for 4 days    Dispense:  6 tablet    Refill:  0   Patient Instructions  Continue current medications. Continue good therapeutic lifestyle changes which include good diet and exercise. Fall precautions discussed with patient. Check on cost of TDAP when you come back for the Prevnar 13 (baby pneumonia shot). If you are over 56 years old - you may need Prevnar 13 or the adult Pneumonia vaccine. Drink plenty of fluids Take Mucinex maximum strength, blue and white in color, over-the-counter, one twice daily with a large glass of water Take antibiotic as directed Use saline nose spray Use inhalers regularly   Nyra Capes MD

## 2013-08-18 LAB — HEPATIC FUNCTION PANEL
ALT: 14 IU/L (ref 0–44)
Total Bilirubin: 0.2 mg/dL (ref 0.0–1.2)
Total Protein: 6.3 g/dL (ref 6.0–8.5)

## 2013-08-18 LAB — NMR, LIPOPROFILE
HDL Cholesterol by NMR: 46 mg/dL (ref >=40)
LDL Particle Number: 1243 nmol/L — ABNORMAL HIGH (ref <1000)
Triglycerides by NMR: 72 mg/dL (ref <150)

## 2013-08-18 LAB — BMP8+EGFR
Calcium: 9.9 mg/dL (ref 8.6–10.2)
Creatinine, Ser: 1.1 mg/dL (ref 0.76–1.27)
GFR calc Af Amer: 80 mL/min/{1.73_m2} (ref >59)
GFR calc non Af Amer: 69 mL/min/{1.73_m2} (ref >59)
Glucose: 123 mg/dL — ABNORMAL HIGH (ref 65–99)

## 2013-08-24 ENCOUNTER — Telehealth: Payer: Self-pay | Admitting: Family Medicine

## 2013-08-26 NOTE — Telephone Encounter (Signed)
Doxycycline 100 mg #30 one twice daily for infection until completed Please call this in for him

## 2013-08-27 ENCOUNTER — Other Ambulatory Visit: Payer: Self-pay | Admitting: Family Medicine

## 2013-08-29 NOTE — Telephone Encounter (Signed)
[place] to address

## 2013-08-30 NOTE — Telephone Encounter (Signed)
Patient is taken care of

## 2013-08-30 NOTE — Telephone Encounter (Signed)
Seen 08/17/13 for bronchitis

## 2013-08-31 ENCOUNTER — Telehealth: Payer: Self-pay | Admitting: Family Medicine

## 2013-08-31 NOTE — Telephone Encounter (Signed)
Pt notified and verbalized understanding.

## 2013-08-31 NOTE — Telephone Encounter (Signed)
Message copied by Baltazar Apo on Wed Aug 31, 2013 10:24 AM ------      Message from: Ernestina Penna      Created: Fri Aug 19, 2013  3:25 PM       LFTs are within normal limits      Blood sugar is elevated at 133. Kidney function electrolytes and potassium are within normal limit.+++++LAB----please add a hemoglobin A1c      On advanced lipid testing, the total LDL particle number is elevated at 1243. 4 months ago this number was excellent at 511. Please try to do better with therapeutic lifestyle changes which include diet and exercise and continue to take your Crestor and zetia regularly!!!!!! triglycerides were good and the HDL particle number was good      The vitamin D level was 40, ideally this should be 50 or greater. Increase D3 1000 daily ------

## 2013-08-31 NOTE — Telephone Encounter (Signed)
done

## 2013-10-20 ENCOUNTER — Other Ambulatory Visit: Payer: Self-pay | Admitting: *Deleted

## 2013-10-20 DIAGNOSIS — J984 Other disorders of lung: Secondary | ICD-10-CM

## 2013-10-28 ENCOUNTER — Ambulatory Visit (HOSPITAL_COMMUNITY)
Admission: RE | Admit: 2013-10-28 | Discharge: 2013-10-28 | Disposition: A | Discharge status: Home / Self Care | Payer: Medicare Other | Source: Ambulatory Visit | Attending: Family Medicine | Admitting: Family Medicine

## 2013-10-28 ENCOUNTER — Other Ambulatory Visit: Payer: Self-pay | Admitting: *Deleted

## 2013-10-28 DIAGNOSIS — J9819 Other pulmonary collapse: Secondary | ICD-10-CM | POA: Insufficient documentation

## 2013-10-28 DIAGNOSIS — J984 Other disorders of lung: Secondary | ICD-10-CM

## 2013-10-28 MED ORDER — LORAZEPAM 0.5 MG PO TABS: 0.5000 mg | ORAL_TABLET | Freq: Every day | ORAL | Status: DC | PRN

## 2013-10-28 NOTE — Telephone Encounter (Signed)
Patient last seen in office on 08-17-13. Rx last filled on 09-19-13. Please advise. If approved please route to Pool A so nurse can phone in to [place]

## 2013-10-28 NOTE — Telephone Encounter (Signed)
Called into [facility] per [employer]

## 2013-10-28 NOTE — Telephone Encounter (Signed)
This is okay to refill 

## 2013-11-07 ENCOUNTER — Other Ambulatory Visit (INDEPENDENT_AMBULATORY_CARE_PROVIDER_SITE_OTHER): Payer: Medicare Other

## 2013-11-07 ENCOUNTER — Other Ambulatory Visit: Payer: Self-pay | Admitting: Family Medicine

## 2013-11-07 DIAGNOSIS — R9389 Abnormal findings on diagnostic imaging of other specified body structures: Secondary | ICD-10-CM

## 2013-12-06 ENCOUNTER — Ambulatory Visit (INDEPENDENT_AMBULATORY_CARE_PROVIDER_SITE_OTHER): Payer: Medicare Other | Admitting: Family Medicine

## 2013-12-06 ENCOUNTER — Encounter: Payer: Self-pay | Admitting: Family Medicine

## 2013-12-06 VITALS — BP 133/95 | HR 105 | Temp 98.4°F | Ht 64.0 in | Wt 193.0 lb

## 2013-12-06 DIAGNOSIS — I10 Essential (primary) hypertension: Secondary | ICD-10-CM

## 2013-12-06 DIAGNOSIS — E559 Vitamin D deficiency, unspecified: Secondary | ICD-10-CM

## 2013-12-06 DIAGNOSIS — E8881 Metabolic syndrome: Secondary | ICD-10-CM

## 2013-12-06 DIAGNOSIS — M1990 Unspecified osteoarthritis, unspecified site: Secondary | ICD-10-CM

## 2013-12-06 DIAGNOSIS — IMO0002 Reserved for concepts with insufficient information to code with codable children: Secondary | ICD-10-CM

## 2013-12-06 DIAGNOSIS — R9389 Abnormal findings on diagnostic imaging of other specified body structures: Secondary | ICD-10-CM

## 2013-12-06 DIAGNOSIS — N400 Enlarged prostate without lower urinary tract symptoms: Secondary | ICD-10-CM | POA: Insufficient documentation

## 2013-12-06 DIAGNOSIS — I6529 Occlusion and stenosis of unspecified carotid artery: Secondary | ICD-10-CM

## 2013-12-06 DIAGNOSIS — Z23 Encounter for immunization: Secondary | ICD-10-CM

## 2013-12-06 DIAGNOSIS — M1710 Unilateral primary osteoarthritis, unspecified knee: Secondary | ICD-10-CM

## 2013-12-06 DIAGNOSIS — E785 Hyperlipidemia, unspecified: Secondary | ICD-10-CM

## 2013-12-06 LAB — POCT URINALYSIS DIPSTICK
Bilirubin, UA: NEGATIVE
GLUCOSE UA: NEGATIVE
Ketones, UA: NEGATIVE
Leukocytes, UA: NEGATIVE
NITRITE UA: NEGATIVE
PROTEIN UA: NEGATIVE
Spec Grav, UA: 1.01
Urobilinogen, UA: NEGATIVE
pH, UA: 7.5

## 2013-12-06 LAB — POCT CBC
GRANULOCYTE PERCENT: 73.9 % (ref 37–80)
HCT, POC: 51.4 % (ref 43.5–53.7)
Hemoglobin: 16.4 g/dL (ref 14.1–18.1)
Lymph, poc: 2.2 (ref 0.6–3.4)
MCH, POC: 29.2 pg (ref 27–31.2)
MCHC: 31.9 g/dL (ref 31.8–35.4)
MCV: 91.7 fL (ref 80–97)
MPV: 7.8 fL (ref 0–99.8)
PLATELET COUNT, POC: 203 10*3/uL (ref 142–424)
POC Granulocyte: 7.3 — AB (ref 2–6.9)
POC LYMPH %: 22.7 % (ref 10–50)
RBC: 5.6 M/uL (ref 4.69–6.13)
RDW, POC: 13.6 %
WBC: 9.9 10*3/uL (ref 4.6–10.2)

## 2013-12-06 LAB — POCT UA - MICROSCOPIC ONLY
Bacteria, U Microscopic: NEGATIVE
Casts, Ur, LPF, POC: NEGATIVE
Crystals, Ur, HPF, POC: NEGATIVE
Mucus, UA: NEGATIVE
WBC, Ur, HPF, POC: NEGATIVE
YEAST UA: NEGATIVE

## 2013-12-06 MED ORDER — HYDROCODONE-ACETAMINOPHEN 5-325 MG PO TABS: 1.0000 | ORAL_TABLET | Freq: Four times a day (QID) | ORAL | Status: DC | PRN

## 2013-12-06 NOTE — Progress Notes (Signed)
Subjective:    Patient ID: Joshua Bowman, male    DOB: [birth year], 68 y.o.   MRN: [id number]  HPI Pt here for follow up and management of chronic medical problems. The patient continues to have problems with his joints especially his back and knees. He also has a history of pulmonary nodules from a CT scan that was done one year ago . This has been repeated and they recommend one more for long-term stability in 1 year.  He will get his lab work done today and he is to do a Prevnar vaccine and a Tdap.       Patient Active Problem List   Diagnosis Date Noted  . Abnormal chest CT 08/17/2013  . Hypertension 03/30/2013  . Degenerative arthritis 03/30/2013  . Hyperlipidemia 03/30/2013  . Personal history of adenomatous colonic polyps 05/06/2012   Outpatient Encounter Prescriptions as of 12/06/2013  Medication Sig  . aspirin 325 MG tablet Take 325 mg by mouth daily.  . budesonide-formoterol (SYMBICORT) 160-4.5 MCG/ACT inhaler Inhale 2 puffs into the lungs 2 (two) times daily. rinse mouth after use  . Cholecalciferol (VITAMIN D3) 5000 UNITS CAPS Take 1 tablet by mouth. 2-3 x week  . ezetimibe (ZETIA) 10 MG tablet   . HYDROcodone-acetaminophen (NORCO/VICODIN) 5-325 MG per tablet Take 1 tablet by mouth every 6 (six) hours as needed for pain.  Marland Kitchen LORazepam (ATIVAN) 0.5 MG tablet Take 1 tablet (0.5 mg total) by mouth daily as needed.  Marland Kitchen PROAIR HFA 108 (90 BASE) MCG/ACT inhaler INHALE ONE OR TWO PUFFS BY MOUTH FOUR TIMES DAILY AS NEEDED  . rosuvastatin (CRESTOR) 40 MG tablet Take 20 mg by mouth daily.   . [DISCONTINUED] azithromycin (ZITHROMAX) 250 MG tablet TAKE 2 TABLETS BY MOUTH ON DAY ONE THEN TAKE 1 EVERY DAY FOR THE NEXT4 DAYS    Review of Systems  Constitutional: Negative.   HENT: Negative.   Eyes: Negative.   Respiratory: Negative.   Cardiovascular: Negative.   Gastrointestinal: Negative.   Endocrine: Negative.   Genitourinary: Negative.   Musculoskeletal: Positive for  arthralgias (back and knees).  Skin: Negative.   Allergic/Immunologic: Negative.   Neurological: Negative.   Hematological: Negative.   Psychiatric/Behavioral: Negative.        Objective:   Physical Exam  Nursing note and vitals reviewed. Constitutional: He is oriented to person, place, and time. He appears well-developed and well-nourished. No distress.  HENT:  Head: Normocephalic and atraumatic.  Right Ear: External ear normal.  Left Ear: External ear normal.  Nose: Nose normal.  Mouth/Throat: Oropharynx is clear and moist. No oropharyngeal exudate.  Eyes: Conjunctivae and EOM are normal. Pupils are equal, round, and reactive to light. Right eye exhibits no discharge. Left eye exhibits no discharge. No scleral icterus.  Neck: Normal range of motion. Neck supple. No thyromegaly present.  Cardiovascular: Normal rate, regular rhythm, normal heart sounds and intact distal pulses.  Exam reveals no gallop and no friction rub.   No murmur heard. At 72 per minute  Pulmonary/Chest: Effort normal and breath sounds normal. No respiratory distress. He has no wheezes. He has no rales. He exhibits no tenderness.  Abdominal: Soft. Bowel sounds are normal. He exhibits no mass. There is no tenderness. There is no rebound and no guarding.  Genitourinary: Rectum normal and penis normal.  Prostate is enlarged bilaterally without any lumps or masses. The testicles were normal without any masses and there were no inguinal nodes  Musculoskeletal: Normal range of motion. He exhibits no  edema and no tenderness.  Left knee pain and stiffness  Lymphadenopathy:    He has no cervical adenopathy.  Neurological: He is alert and oriented to person, place, and time. He has normal reflexes. No cranial nerve deficit.  Skin: Skin is warm and dry. No rash noted. No erythema. No pallor.  Psychiatric: He has a normal mood and affect. His behavior is normal. Judgment and thought content normal.   BP 133/95  Pulse 105   Temp(Src) 98.4 F (36.9 C) (Oral)  Ht $R'5\' 4"'OW$  (1.626 m)  Wt 193 lb (87.544 kg)  BMI 33.11 kg/m2        Assessment & Plan:  1. Degenerative arthritis - POCT CBC  2. Hyperlipidemia - POCT CBC - NMR, lipoprofile  3. Hypertension - POCT CBC - BMP8+EGFR - Hepatic function panel  4. Osteoarthrosis, unspecified whether generalized or localized, involving lower leg - HYDROcodone-acetaminophen (NORCO/VICODIN) 5-325 MG per tablet; Take 1 tablet by mouth every 6 (six) hours as needed.  Dispense: 120 tablet; Refill: 0 - POCT CBC  5. BPH (benign prostatic hyperplasia) - POCT CBC - POCT urinalysis dipstick - POCT UA - Microscopic Only - PSA, total and free  6. Vitamin D deficiency - Vit D  25 hydroxy (rtn osteoporosis monitoring)  7. Metabolic syndrome  8. Carotid artery stenosis  9. Abnormal chest CT -repeat in 1year  Patient Instructions                       Medicare Annual Wellness Visit  [facility] and the medical providers at [facility] strive to bring you the best medical care.  In doing so we not only want to address your current medical conditions and concerns but also to detect new conditions early and prevent illness, disease and health-related problems.    Medicare offers a yearly Wellness Visit which allows our clinical staff to assess your need for preventative services including immunizations, lifestyle education, counseling to decrease risk of preventable diseases and screening for fall risk and other medical concerns.    This visit is provided free of charge (no copay) for all Medicare recipients. The clinical pharmacists at [facility] have begun to conduct these Wellness Visits which will also include a thorough review of all your medications.    As you primary medical provider recommend that you make an appointment for your Annual Wellness Visit if you have not done so already this year.  You may set up this  appointment before you leave today or you may call back ([phone]) and schedule an appointment.  Please make sure when you call that you mention that you are scheduling your Annual Wellness Visit with the clinical pharmacist so that the appointment may be made for the proper length of time.     Continue current medications. Continue good therapeutic lifestyle changes which include good diet and exercise. Fall precautions discussed with patient. If an FOBT was given today- please return it to our front desk. If you are over 5 years old - you may need Prevnar 55 or the adult Pneumonia vaccine.  Continue to watch your diet and try to get more exercise.  Watch the sodium in your diet Continue to monitor your stools closely for any blood loss Remember that you'll need another colonoscopy next year Continue to try to stop smoking Continue to monitor your blood pressure at home    Arrie Senate MD

## 2013-12-06 NOTE — Addendum Note (Signed)
Addended by: Zannie Cove on: 12/06/2013 12:39 PM   Modules accepted: Orders

## 2013-12-06 NOTE — Patient Instructions (Addendum)
Medicare Annual Wellness Visit  [facility] and the medical providers at [facility] strive to bring you the best medical care.  In doing so we not only want to address your current medical conditions and concerns but also to detect new conditions early and prevent illness, disease and health-related problems.    Medicare offers a yearly Wellness Visit which allows our clinical staff to assess your need for preventative services including immunizations, lifestyle education, counseling to decrease risk of preventable diseases and screening for fall risk and other medical concerns.    This visit is provided free of charge (no copay) for all Medicare recipients. The clinical pharmacists at [facility] have begun to conduct these Wellness Visits which will also include a thorough review of all your medications.    As you primary medical provider recommend that you make an appointment for your Annual Wellness Visit if you have not done so already this year.  You may set up this appointment before you leave today or you may call back ([phone]) and schedule an appointment.  Please make sure when you call that you mention that you are scheduling your Annual Wellness Visit with the clinical pharmacist so that the appointment may be made for the proper length of time.     Continue current medications. Continue good therapeutic lifestyle changes which include good diet and exercise. Fall precautions discussed with patient. If an FOBT was given today- please return it to our front desk. If you are over 27 years old - you may need Prevnar 110 or the adult Pneumonia vaccine.  Continue to watch your diet and try to get more exercise.  Watch the sodium in your diet Continue to monitor your stools closely for any blood loss Remember that you'll need another colonoscopy next year Continue to try to stop smoking Continue to monitor your blood  pressure at home

## 2013-12-07 ENCOUNTER — Other Ambulatory Visit: Payer: Medicare Other

## 2013-12-07 DIAGNOSIS — Z1212 Encounter for screening for malignant neoplasm of rectum: Secondary | ICD-10-CM

## 2013-12-07 NOTE — Progress Notes (Signed)
Pt dropped off FOBT only 

## 2013-12-08 LAB — BMP8+EGFR
BUN/Creatinine Ratio: 16 (ref 10–22)
BUN: 16 mg/dL (ref 8–27)
CO2: 23 mmol/L (ref 18–29)
Calcium: 9.5 mg/dL (ref 8.6–10.2)
Chloride: 99 mmol/L (ref 97–108)
Creatinine, Ser: 1.03 mg/dL (ref 0.76–1.27)
GFR calc non Af Amer: 75 mL/min/{1.73_m2} (ref >59)
GFR, EST AFRICAN AMERICAN: 86 mL/min/{1.73_m2} (ref >59)
GLUCOSE: 104 mg/dL — AB (ref 65–99)
POTASSIUM: 4.2 mmol/L (ref 3.5–5.2)
Sodium: 135 mmol/L (ref 134–144)

## 2013-12-08 LAB — NMR, LIPOPROFILE
Cholesterol: 118 mg/dL (ref <200)
HDL CHOLESTEROL BY NMR: 45 mg/dL (ref >=40)
HDL PARTICLE NUMBER: 31.4 umol/L (ref >=30.5)
LDL PARTICLE NUMBER: 1033 nmol/L — AB (ref <1000)
LDL Size: 20.4 nm — ABNORMAL LOW (ref >20.5)
LDLC SERPL CALC-MCNC: 59 mg/dL (ref <100)
LP-IR SCORE: 45 (ref <=45)
Small LDL Particle Number: 529 nmol/L — ABNORMAL HIGH (ref <=527)
TRIGLYCERIDES BY NMR: 69 mg/dL (ref <150)

## 2013-12-08 LAB — HEPATIC FUNCTION PANEL
ALBUMIN: 4.3 g/dL (ref 3.6–4.8)
ALK PHOS: 49 IU/L (ref 39–117)
ALT: 15 IU/L (ref 0–44)
AST: 19 IU/L (ref 0–40)
Bilirubin, Direct: 0.11 mg/dL (ref 0.00–0.40)
TOTAL PROTEIN: 6.2 g/dL (ref 6.0–8.5)
Total Bilirubin: 0.4 mg/dL (ref 0.0–1.2)

## 2013-12-08 LAB — PSA, TOTAL AND FREE
PSA FREE PCT: 13 %
PSA, Free: 0.26 ng/mL
PSA: 2 ng/mL (ref 0.0–4.0)

## 2013-12-08 LAB — FECAL OCCULT BLOOD, IMMUNOCHEMICAL: Fecal Occult Bld: NEGATIVE

## 2013-12-08 LAB — VITAMIN D 25 HYDROXY (VIT D DEFICIENCY, FRACTURES): VIT D 25 HYDROXY: 40 ng/mL (ref 30.0–100.0)

## 2013-12-26 ENCOUNTER — Encounter (INDEPENDENT_AMBULATORY_CARE_PROVIDER_SITE_OTHER): Payer: Medicare Other | Admitting: Ophthalmology

## 2013-12-26 ENCOUNTER — Encounter: Payer: Self-pay | Admitting: *Deleted

## 2013-12-26 DIAGNOSIS — H4310 Vitreous hemorrhage, unspecified eye: Secondary | ICD-10-CM

## 2013-12-26 DIAGNOSIS — H43819 Vitreous degeneration, unspecified eye: Secondary | ICD-10-CM

## 2013-12-26 DIAGNOSIS — H2510 Age-related nuclear cataract, unspecified eye: Secondary | ICD-10-CM

## 2013-12-26 DIAGNOSIS — H33309 Unspecified retinal break, unspecified eye: Secondary | ICD-10-CM

## 2013-12-26 NOTE — Progress Notes (Signed)
Quick Note:  Copy of labs sent to patient ______ 

## 2014-01-12 ENCOUNTER — Ambulatory Visit (INDEPENDENT_AMBULATORY_CARE_PROVIDER_SITE_OTHER): Payer: Medicare Other | Admitting: Ophthalmology

## 2014-01-12 DIAGNOSIS — H33309 Unspecified retinal break, unspecified eye: Secondary | ICD-10-CM

## 2014-02-24 ENCOUNTER — Other Ambulatory Visit: Payer: Self-pay | Admitting: Family Medicine

## 2014-04-06 ENCOUNTER — Ambulatory Visit (INDEPENDENT_AMBULATORY_CARE_PROVIDER_SITE_OTHER): Payer: Medicare Other | Admitting: Family Medicine

## 2014-04-06 ENCOUNTER — Encounter: Payer: Self-pay | Admitting: Family Medicine

## 2014-04-06 VITALS — BP 146/86 | HR 98 | Temp 98.0°F | Ht 64.0 in | Wt 191.0 lb

## 2014-04-06 DIAGNOSIS — F341 Dysthymic disorder: Secondary | ICD-10-CM

## 2014-04-06 DIAGNOSIS — R5383 Other fatigue: Secondary | ICD-10-CM

## 2014-04-06 DIAGNOSIS — R5381 Other malaise: Secondary | ICD-10-CM

## 2014-04-06 DIAGNOSIS — IMO0002 Reserved for concepts with insufficient information to code with codable children: Secondary | ICD-10-CM

## 2014-04-06 DIAGNOSIS — F419 Anxiety disorder, unspecified: Secondary | ICD-10-CM

## 2014-04-06 DIAGNOSIS — E785 Hyperlipidemia, unspecified: Secondary | ICD-10-CM

## 2014-04-06 DIAGNOSIS — F32A Depression, unspecified: Secondary | ICD-10-CM

## 2014-04-06 DIAGNOSIS — M1710 Unilateral primary osteoarthritis, unspecified knee: Secondary | ICD-10-CM

## 2014-04-06 DIAGNOSIS — E559 Vitamin D deficiency, unspecified: Secondary | ICD-10-CM

## 2014-04-06 DIAGNOSIS — R319 Hematuria, unspecified: Secondary | ICD-10-CM

## 2014-04-06 DIAGNOSIS — F329 Major depressive disorder, single episode, unspecified: Secondary | ICD-10-CM

## 2014-04-06 LAB — POCT CBC
Granulocyte percent: 76.9 %G (ref 37–80)
HCT, POC: 55 % — AB (ref 43.5–53.7)
Hemoglobin: 17.7 g/dL (ref 14.1–18.1)
Lymph, poc: 2.5 (ref 0.6–3.4)
MCH, POC: 29.4 pg (ref 27–31.2)
MCHC: 32.3 g/dL (ref 31.8–35.4)
MCV: 91.2 fL (ref 80–97)
MPV: 8.5 fL (ref 0–99.8)
PLATELET COUNT, POC: 305 10*3/uL (ref 142–424)
POC Granulocyte: 9 — AB (ref 2–6.9)
POC LYMPH PERCENT: 21.5 %L (ref 10–50)
RBC: 6 M/uL (ref 4.69–6.13)
RDW, POC: 13.5 %
WBC: 11.7 10*3/uL — AB (ref 4.6–10.2)

## 2014-04-06 LAB — POCT UA - MICROSCOPIC ONLY
Bacteria, U Microscopic: NEGATIVE
Casts, Ur, LPF, POC: NEGATIVE
Crystals, Ur, HPF, POC: NEGATIVE
Mucus, UA: NEGATIVE
YEAST UA: NEGATIVE

## 2014-04-06 LAB — POCT URINALYSIS DIPSTICK
BILIRUBIN UA: NEGATIVE
GLUCOSE UA: NEGATIVE
Ketones, UA: NEGATIVE
Leukocytes, UA: NEGATIVE
NITRITE UA: NEGATIVE
Protein, UA: NEGATIVE
Spec Grav, UA: <=1.005
Urobilinogen, UA: NEGATIVE
pH, UA: 6.5

## 2014-04-06 MED ORDER — HYDROCODONE-ACETAMINOPHEN 5-325 MG PO TABS: 1.0000 | ORAL_TABLET | Freq: Four times a day (QID) | ORAL | Status: DC | PRN

## 2014-04-06 NOTE — Progress Notes (Signed)
Subjective:    Patient ID: Joshua Bowman, male    DOB: [birth year], 68 y.o.   MRN: [id number]  HPI Pt is here today for follow up of chronic medical problems whih include hypertension, hyperlipidemia and metabolic syndrome.       Patient Active Problem List   Diagnosis Date Noted  . BPH (benign prostatic hyperplasia) 12/06/2013  . Metabolic syndrome 47/42/5956  . Carotid artery stenosis 12/06/2013  . Abnormal chest CT 08/17/2013  . Hypertension 03/30/2013  . Degenerative arthritis 03/30/2013  . Hyperlipidemia 03/30/2013  . Personal history of adenomatous colonic polyps 05/06/2012   Outpatient Encounter Prescriptions as of 04/06/2014  Medication Sig  . aspirin 325 MG tablet Take 325 mg by mouth daily.  . budesonide-formoterol (SYMBICORT) 160-4.5 MCG/ACT inhaler Inhale 2 puffs into the lungs 2 (two) times daily. rinse mouth after use  . Cholecalciferol (VITAMIN D3) 5000 UNITS CAPS Take 1 tablet by mouth. 2-3 x week  . CRESTOR 40 MG tablet TAKE ONE TABLET BY MOUTH EVERY DAY AS DIRECTED  . ezetimibe (ZETIA) 10 MG tablet   . HYDROcodone-acetaminophen (NORCO/VICODIN) 5-325 MG per tablet Take 1 tablet by mouth every 6 (six) hours as needed.  Marland Kitchen LORazepam (ATIVAN) 0.5 MG tablet Take 1 tablet (0.5 mg total) by mouth daily as needed.  Marland Kitchen PROAIR HFA 108 (90 BASE) MCG/ACT inhaler INHALE ONE OR TWO PUFFS BY MOUTH FOUR TIMES DAILY AS NEEDED     Review of Systems  Constitutional: Positive for fatigue (slight).  HENT: Negative.   Eyes: Positive for visual disturbance (vitreous detachment in Left eye in April, Dr Mickie Hillier).  Respiratory: Positive for cough (intermitent) and wheezing (intermitent). Negative for shortness of breath.   Genitourinary: Negative.   Musculoskeletal: Positive for back pain (lower, intermitent) and joint swelling (knees, intermitent).  Neurological: Negative for dizziness and headaches.  Psychiatric/Behavioral: Negative for confusion. The patient is  nervous/anxious (more anxitety after retirement).        Objective:   Physical Exam  Nursing note and vitals reviewed. Constitutional: He is oriented to person, place, and time. He appears well-developed and well-nourished. No distress.  HENT:  Head: Normocephalic and atraumatic.  Right Ear: External ear normal.  Left Ear: External ear normal.  Mouth/Throat: Oropharynx is clear and moist. No oropharyngeal exudate.  As the congestion bilaterally  Eyes: Conjunctivae and EOM are normal. Pupils are equal, round, and reactive to light. Right eye exhibits no discharge. Left eye exhibits no discharge. No scleral icterus.  Neck: Normal range of motion. Neck supple. No thyromegaly present.  Cardiovascular: Normal rate, regular rhythm, normal heart sounds and intact distal pulses.  Exam reveals no gallop and no friction rub.   No murmur heard. At 84 per minute  Pulmonary/Chest: Effort normal and breath sounds normal. No respiratory distress. He has no wheezes. He has no rales. He exhibits no tenderness.  Minimal congestion with coughing and deep breathing  Abdominal: Soft. Bowel sounds are normal. He exhibits no mass. There is no tenderness. There is no rebound and no guarding.  Musculoskeletal: Normal range of motion. He exhibits no edema and no tenderness.  Degenerative changes in both knees and feet.  Lymphadenopathy:    He has no cervical adenopathy.  Neurological: He is alert and oriented to person, place, and time. He has normal reflexes.  Skin: Skin is warm and dry. No rash noted. No erythema. No pallor.  Psychiatric: He has a normal mood and affect. His behavior is normal. Judgment and thought  content normal.  Patient is calm in demeanor but does complain of increased anxiety because of being at home   BP 146/86  Pulse 98  Temp(Src) 98 F (36.7 C) (Oral)  Ht $R'5\' 4"'Ai$  (1.626 m)  Wt 191 lb (86.637 kg)  BMI 32.77 kg/m2        Assessment & Plan:  1. Vitamin D deficiency - Vit D   25 hydroxy (rtn osteoporosis monitoring)  2. Other fatigue - POCT CBC - BMP8+EGFR - Vit D  25 hydroxy (rtn osteoporosis monitoring)  3. Hyperlipemia - NMR, lipoprofile - Hepatic function panel  4. Hematuria - POCT UA - Microscopic Only - POCT urinalysis dipstick  5. Osteoarthrosis, unspecified whether generalized or localized, involving lower leg - HYDROcodone-acetaminophen (NORCO/VICODIN) 5-325 MG per tablet; Take 1 tablet by mouth every 6 (six) hours as needed.  Dispense: 120 tablet; Refill: 0  6. Anxiety and depression   Patient Instructions  Continue current medications. Continue good therapeutic lifestyle changes which include good diet and exercise. Fall precautions discussed with patient. If an FOBT was given today- please return it to our front desk. If you are over 52 years old - you may need Prevnar 86 or the adult Pneumonia vaccine. Use Mucinex maximum strength, blue and white in color one twice daily for cough and congestion Drink plenty of fluids Stay as active as possible as this will be good for your anxiety and your physical health Try to get involved in helping other people, this is your gift Please bring your blood pressure monitor by the office to correlate it with our blood pressure monitor Continue to watch sodium intake   Arrie Senate MD

## 2014-04-06 NOTE — Patient Instructions (Addendum)
Continue current medications. Continue good therapeutic lifestyle changes which include good diet and exercise. Fall precautions discussed with patient. If an FOBT was given today- please return it to our front desk. If you are over 68 years old - you may need Prevnar 52 or the adult Pneumonia vaccine. Use Mucinex maximum strength, blue and white in color one twice daily for cough and congestion Drink plenty of fluids Stay as active as possible as this will be good for your anxiety and your physical health Try to get involved in helping other people, this is your gift Please bring your blood pressure monitor by the office to correlate it with our blood pressure monitor Continue to watch sodium intake

## 2014-04-07 LAB — BMP8+EGFR
BUN / CREAT RATIO: 12 (ref 10–22)
BUN: 14 mg/dL (ref 8–27)
CO2: 22 mmol/L (ref 18–29)
CREATININE: 1.16 mg/dL (ref 0.76–1.27)
Calcium: 10 mg/dL (ref 8.6–10.2)
Chloride: 100 mmol/L (ref 97–108)
GFR calc Af Amer: 74 mL/min/{1.73_m2} (ref >59)
GFR, EST NON AFRICAN AMERICAN: 64 mL/min/{1.73_m2} (ref >59)
Glucose: 99 mg/dL (ref 65–99)
POTASSIUM: 4.6 mmol/L (ref 3.5–5.2)
Sodium: 140 mmol/L (ref 134–144)

## 2014-04-07 LAB — NMR, LIPOPROFILE
Cholesterol: 121 mg/dL (ref 100–199)
HDL Cholesterol by NMR: 51 mg/dL (ref >39)
HDL PARTICLE NUMBER: 36.5 umol/L (ref >=30.5)
LDL Particle Number: 661 nmol/L (ref <1000)
LDL SIZE: 20.4 nm (ref >20.5)
LDLC SERPL CALC-MCNC: 56 mg/dL (ref 0–99)
LP-IR Score: 30 (ref <=45)
SMALL LDL PARTICLE NUMBER: 309 nmol/L (ref <=527)
TRIGLYCERIDES BY NMR: 69 mg/dL (ref 0–149)

## 2014-04-07 LAB — HEPATIC FUNCTION PANEL
ALK PHOS: 49 IU/L (ref 39–117)
ALT: 14 IU/L (ref 0–44)
AST: 20 IU/L (ref 0–40)
Albumin: 4.5 g/dL (ref 3.6–4.8)
Bilirubin, Direct: 0.13 mg/dL (ref 0.00–0.40)
Total Bilirubin: 0.4 mg/dL (ref 0.0–1.2)
Total Protein: 6.9 g/dL (ref 6.0–8.5)

## 2014-04-07 LAB — VITAMIN D 25 HYDROXY (VIT D DEFICIENCY, FRACTURES): Vit D, 25-Hydroxy: 41.5 ng/mL (ref 30.0–100.0)

## 2014-04-27 ENCOUNTER — Other Ambulatory Visit: Payer: Self-pay | Admitting: Family Medicine

## 2014-05-15 ENCOUNTER — Ambulatory Visit (INDEPENDENT_AMBULATORY_CARE_PROVIDER_SITE_OTHER): Payer: Medicare Other | Admitting: Ophthalmology

## 2014-05-15 ENCOUNTER — Other Ambulatory Visit: Payer: Self-pay | Admitting: Family Medicine

## 2014-05-15 DIAGNOSIS — H33309 Unspecified retinal break, unspecified eye: Secondary | ICD-10-CM

## 2014-05-15 DIAGNOSIS — H4310 Vitreous hemorrhage, unspecified eye: Secondary | ICD-10-CM

## 2014-05-15 DIAGNOSIS — H2510 Age-related nuclear cataract, unspecified eye: Secondary | ICD-10-CM

## 2014-05-16 NOTE — Telephone Encounter (Signed)
Last filled 04/10/14, last seen 04/06/14. Route to pool A, nurse call into [facility]

## 2014-05-16 NOTE — Telephone Encounter (Signed)
This is okay to refill x6 months

## 2014-05-17 NOTE — Telephone Encounter (Signed)
Called into [facility]

## 2014-08-08 ENCOUNTER — Ambulatory Visit: Payer: Medicare Other | Admitting: Family Medicine

## 2014-08-09 ENCOUNTER — Ambulatory Visit (INDEPENDENT_AMBULATORY_CARE_PROVIDER_SITE_OTHER): Payer: Medicare Other | Admitting: Family Medicine

## 2014-08-09 ENCOUNTER — Telehealth: Payer: Self-pay | Admitting: Family Medicine

## 2014-08-09 ENCOUNTER — Encounter: Payer: Self-pay | Admitting: Family Medicine

## 2014-08-09 VITALS — BP 121/87 | HR 105 | Temp 97.7°F | Ht 64.0 in | Wt 188.0 lb

## 2014-08-09 DIAGNOSIS — N400 Enlarged prostate without lower urinary tract symptoms: Secondary | ICD-10-CM

## 2014-08-09 DIAGNOSIS — E785 Hyperlipidemia, unspecified: Secondary | ICD-10-CM

## 2014-08-09 DIAGNOSIS — M1710 Unilateral primary osteoarthritis, unspecified knee: Secondary | ICD-10-CM

## 2014-08-09 DIAGNOSIS — I10 Essential (primary) hypertension: Secondary | ICD-10-CM

## 2014-08-09 DIAGNOSIS — E8881 Metabolic syndrome: Secondary | ICD-10-CM

## 2014-08-09 DIAGNOSIS — Z23 Encounter for immunization: Secondary | ICD-10-CM

## 2014-08-09 DIAGNOSIS — M2012 Hallux valgus (acquired), left foot: Secondary | ICD-10-CM

## 2014-08-09 DIAGNOSIS — M21612 Bunion of left foot: Secondary | ICD-10-CM

## 2014-08-09 DIAGNOSIS — E559 Vitamin D deficiency, unspecified: Secondary | ICD-10-CM

## 2014-08-09 DIAGNOSIS — M179 Osteoarthritis of knee, unspecified: Secondary | ICD-10-CM

## 2014-08-09 DIAGNOSIS — R911 Solitary pulmonary nodule: Secondary | ICD-10-CM

## 2014-08-09 DIAGNOSIS — J410 Simple chronic bronchitis: Secondary | ICD-10-CM

## 2014-08-09 DIAGNOSIS — IMO0002 Reserved for concepts with insufficient information to code with codable children: Secondary | ICD-10-CM

## 2014-08-09 LAB — POCT CBC
Granulocyte percent: 76.6 %G (ref 37–80)
HCT, POC: 49.9 % (ref 43.5–53.7)
HEMOGLOBIN: 17 g/dL (ref 14.1–18.1)
LYMPH, POC: 2.5 (ref 0.6–3.4)
MCH: 30.7 pg (ref 27–31.2)
MCHC: 34 g/dL (ref 31.8–35.4)
MCV: 90.4 fL (ref 80–97)
MPV: 8.1 fL (ref 0–99.8)
PLATELET COUNT, POC: 233 10*3/uL (ref 142–424)
POC Granulocyte: 8.8 — AB (ref 2–6.9)
POC LYMPH PERCENT: 22.1 %L (ref 10–50)
RBC: 5.5 M/uL (ref 4.69–6.13)
RDW, POC: 13.8 %
WBC: 11.5 10*3/uL — AB (ref 4.6–10.2)

## 2014-08-09 MED ORDER — LORAZEPAM 0.5 MG PO TABS: ORAL_TABLET | ORAL | Status: DC

## 2014-08-09 MED ORDER — HYDROCODONE-ACETAMINOPHEN 5-325 MG PO TABS: 1.0000 | ORAL_TABLET | Freq: Four times a day (QID) | ORAL | Status: DC | PRN

## 2014-08-09 MED ORDER — ALBUTEROL SULFATE HFA 108 (90 BASE) MCG/ACT IN AERS: INHALATION_SPRAY | RESPIRATORY_TRACT | Status: DC

## 2014-08-09 NOTE — Progress Notes (Signed)
Subjective:    Patient ID: Joshua Bowman, male    DOB: 07/13/1946, 68 y.o.   MRN: [id number]  HPI Pt here for follow up and management of chronic medical problems.the patient complains of some head congestion which appears to be getting better. The patient is requesting refills on his pain medicine and his lorazepam. The patient continues to have ongoing problems with his knees and back and is in need of having knee replacement. He does not use his inhalers regularly and he will be encouraged to do this.         Patient Active Problem List   Diagnosis Date Noted  . BPH (benign prostatic hyperplasia) 12/06/2013  . Metabolic syndrome 17/00/1749  . Carotid artery stenosis 12/06/2013  . Abnormal chest CT 08/17/2013  . Hypertension 03/30/2013  . Degenerative arthritis 03/30/2013  . Hyperlipidemia 03/30/2013  . Personal history of adenomatous colonic polyps 05/06/2012   Outpatient Encounter Prescriptions as of 08/09/2014  Medication Sig  . aspirin 325 MG tablet Take 325 mg by mouth daily.  . Cholecalciferol (VITAMIN D3) 5000 UNITS CAPS Take 1 tablet by mouth. 2-3 x week  . CRESTOR 40 MG tablet TAKE ONE TABLET BY MOUTH EVERY DAY AS DIRECTED  . HYDROcodone-acetaminophen (NORCO/VICODIN) 5-325 MG per tablet Take 1 tablet by mouth every 6 (six) hours as needed.  Marland Kitchen LORazepam (ATIVAN) 0.5 MG tablet TAKE ONE TABLET BY MOUTH DAILY AS NEEDED  . PROAIR HFA 108 (90 BASE) MCG/ACT inhaler INHALE ONE OR TWO PUFFS BY MOUTH FOUR TIMES DAILY AS NEEDED  . SYMBICORT 160-4.5 MCG/ACT inhaler INHALE 2 PUFFS BY MOUTH INTO THE LUNGS TWICE A DAY (RINSE MOUTH AFTER EACH USE)  . ZETIA 10 MG tablet TAKE ONE TABLET BY MOUTH ONE TIME DAILY    Review of Systems  Constitutional: Negative.   HENT: Positive for congestion (slight - started 1 mo ago - getting better) and postnasal drip (clear color).   Eyes: Negative.   Respiratory: Negative.   Cardiovascular: Negative.   Gastrointestinal: Negative.   Endocrine:  Negative.   Genitourinary: Negative.   Musculoskeletal: Positive for arthralgias.  Skin: Negative.   Allergic/Immunologic: Negative.   Neurological: Negative.   Hematological: Negative.   Psychiatric/Behavioral: Negative.        Objective:   Physical Exam  Constitutional: He is oriented to person, place, and time. He appears well-developed and well-nourished. No distress.  HENT:  Head: Normocephalic and atraumatic.  Right Ear: External ear normal.  Left Ear: External ear normal.  Mouth/Throat: Oropharynx is clear and moist. No oropharyngeal exudate.  Nasal congestion right greater than left  Eyes: Conjunctivae and EOM are normal. Pupils are equal, round, and reactive to light. Right eye exhibits no discharge. Left eye exhibits no discharge. No scleral icterus.  Neck: Normal range of motion. Neck supple. No thyromegaly present.  No anterior cervical adenopathy or carotid bruits were audible  Cardiovascular: Normal rate, regular rhythm, normal heart sounds and intact distal pulses.  Exam reveals no gallop and no friction rub.   No murmur heard. At 72/m  Pulmonary/Chest: Effort normal and breath sounds normal. No respiratory distress. He has no wheezes. He has no rales. He exhibits no tenderness.  No axillary adenopathy  Abdominal: Soft. Bowel sounds are normal. He exhibits no mass. There is no tenderness. There is no rebound and no guarding.  Musculoskeletal: Normal range of motion. He exhibits no edema or tenderness.  Movement is slow due to back stiffness and end-stage degenerative joint disease in both  knees.the patient has a very significant bunion of his left foot  Lymphadenopathy:    He has no cervical adenopathy.  Neurological: He is alert and oriented to person, place, and time. He has normal reflexes. No cranial nerve deficit.  Skin: Skin is warm and dry. No rash noted. No erythema. No pallor.  Psychiatric: He has a normal mood and affect. His behavior is normal. Judgment  and thought content normal.  Nursing note and vitals reviewed.  BP 151/88 mmHg  Pulse 105  Temp(Src) 97.7 F (36.5 C) (Oral)  Ht '5\' 4"'  (1.626 m)  Wt 188 lb (85.276 kg)  BMI 32.25 kg/m2        Assessment & Plan:  1. Vitamin D deficiency - POCT CBC - Vit D  25 hydroxy (rtn osteoporosis monitoring)  2. Hyperlipemia - POCT CBC - NMR, lipoprofile  3. Essential hypertension - POCT CBC - BMP8+EGFR - Hepatic function panel  4. Metabolic syndrome - POCT CBC - BMP8+EGFR  5. BPH (benign prostatic hyperplasia) - POCT CBC  6. Osteoarthrosis, unspecified whether generalized or localized, involving lower leg - HYDROcodone-acetaminophen (NORCO/VICODIN) 5-325 MG per tablet; Take 1 tablet by mouth every 6 (six) hours as needed.  Dispense: 120 tablet; Refill: 0  7. Lung nodule  8. Simple chronic bronchitis  9. Bunion, left foot  Meds ordered this encounter  Medications  . albuterol (PROAIR HFA) 108 (90 BASE) MCG/ACT inhaler    Sig: INHALE ONE OR TWO PUFFS BY MOUTH FOUR TIMES DAILY AS NEEDED    Dispense:  18 g    Refill:  4  . LORazepam (ATIVAN) 0.5 MG tablet    Sig: TAKE ONE TABLET BY MOUTH DAILY AS NEEDED    Dispense:  30 tablet    Refill:  2  . HYDROcodone-acetaminophen (NORCO/VICODIN) 5-325 MG per tablet    Sig: Take 1 tablet by mouth every 6 (six) hours as needed.    Dispense:  120 tablet    Refill:  0   Patient Instructions                       Medicare Annual Wellness Visit  [facility] and the medical providers at [facility] strive to bring you the best medical care.  In doing so we not only want to address your current medical conditions and concerns but also to detect new conditions early and prevent illness, disease and health-related problems.    Medicare offers a yearly Wellness Visit which allows our clinical staff to assess your need for preventative services including immunizations, lifestyle education, counseling to decrease risk  of preventable diseases and screening for fall risk and other medical concerns.    This visit is provided free of charge (no copay) for all Medicare recipients. The clinical pharmacists at [facility] have begun to conduct these Wellness Visits which will also include a thorough review of all your medications.    As you primary medical provider recommend that you make an appointment for your Annual Wellness Visit if you have not done so already this year.  You may set up this appointment before you leave today or you may call back ([phone]) and schedule an appointment.  Please make sure when you call that you mention that you are scheduling your Annual Wellness Visit with the clinical pharmacist so that the appointment may be made for the proper length of time.      Continue current medications. Continue good therapeutic lifestyle  changes which include good diet and exercise. Fall precautions discussed with patient. If an FOBT was given today- please return it to our front desk. If you are over 37 years old - you may need Prevnar 15 or the adult Pneumonia vaccine.  Flu Shots will be available at our office starting mid- September. Please call and schedule a FLU CLINIC APPOINTMENT.   Use the Mucinex and the Symbicort more regularly Continue to drink plenty of fluids Keep the house cooler in the wintertime and as usual make all efforts possible to stop smoking Please check with your insurance regarding the Tdap vaccine Your next CT scan of the lung should be sometime in late January or February   Arrie Senate MD

## 2014-08-09 NOTE — Telephone Encounter (Signed)
Stp reviewed CBC results, pt feeling much better, still has slight cough, advised to continue with the symbicort and to use the mucinex more frequently, pt states if he starts to get worse he will call back on Monday for the antibiotic, will close call.

## 2014-08-09 NOTE — Patient Instructions (Addendum)
Medicare Annual Wellness Visit  [facility] and the medical providers at [facility] strive to bring you the best medical care.  In doing so we not only want to address your current medical conditions and concerns but also to detect new conditions early and prevent illness, disease and health-related problems.    Medicare offers a yearly Wellness Visit which allows our clinical staff to assess your need for preventative services including immunizations, lifestyle education, counseling to decrease risk of preventable diseases and screening for fall risk and other medical concerns.    This visit is provided free of charge (no copay) for all Medicare recipients. The clinical pharmacists at [facility] have begun to conduct these Wellness Visits which will also include a thorough review of all your medications.    As you primary medical provider recommend that you make an appointment for your Annual Wellness Visit if you have not done so already this year.  You may set up this appointment before you leave today or you may call back ([phone]) and schedule an appointment.  Please make sure when you call that you mention that you are scheduling your Annual Wellness Visit with the clinical pharmacist so that the appointment may be made for the proper length of time.      Continue current medications. Continue good therapeutic lifestyle changes which include good diet and exercise. Fall precautions discussed with patient. If an FOBT was given today- please return it to our front desk. If you are over 49 years old - you may need Prevnar 30 or the adult Pneumonia vaccine.  Flu Shots will be available at our office starting mid- September. Please call and schedule a FLU CLINIC APPOINTMENT.   Use the Mucinex and the Symbicort more regularly Continue to drink plenty of fluids Keep the house cooler in the wintertime and as usual make all  efforts possible to stop smoking Please check with your insurance regarding the Tdap vaccine Your next CT scan of the lung should be sometime in late January or February

## 2014-08-10 LAB — BMP8+EGFR
BUN/Creatinine Ratio: 12 (ref 10–22)
BUN: 12 mg/dL (ref 8–27)
CALCIUM: 9.8 mg/dL (ref 8.6–10.2)
CO2: 23 mmol/L (ref 18–29)
Chloride: 99 mmol/L (ref 97–108)
Creatinine, Ser: 1.04 mg/dL (ref 0.76–1.27)
GFR calc Af Amer: 85 mL/min/{1.73_m2} (ref >59)
GFR, EST NON AFRICAN AMERICAN: 73 mL/min/{1.73_m2} (ref >59)
Glucose: 101 mg/dL — ABNORMAL HIGH (ref 65–99)
Potassium: 4.5 mmol/L (ref 3.5–5.2)
Sodium: 139 mmol/L (ref 134–144)

## 2014-08-10 LAB — NMR, LIPOPROFILE
Cholesterol: 122 mg/dL (ref 100–199)
HDL Cholesterol by NMR: 45 mg/dL (ref >39)
HDL Particle Number: 34 umol/L (ref >=30.5)
LDL Particle Number: 773 nmol/L (ref <1000)
LDL SIZE: 20.1 nm (ref >20.5)
LDL-C: 60 mg/dL (ref 0–99)
LP-IR SCORE: 35 (ref <=45)
Small LDL Particle Number: 482 nmol/L (ref <=527)
TRIGLYCERIDES BY NMR: 83 mg/dL (ref 0–149)

## 2014-08-10 LAB — VITAMIN D 25 HYDROXY (VIT D DEFICIENCY, FRACTURES): Vit D, 25-Hydroxy: 40.5 ng/mL (ref 30.0–100.0)

## 2014-08-10 LAB — HEPATIC FUNCTION PANEL
ALK PHOS: 48 IU/L (ref 39–117)
ALT: 10 IU/L (ref 0–44)
AST: 18 IU/L (ref 0–40)
Albumin: 4.2 g/dL (ref 3.6–4.8)
BILIRUBIN DIRECT: 0.13 mg/dL (ref 0.00–0.40)
Total Bilirubin: 0.4 mg/dL (ref 0.0–1.2)
Total Protein: 6.4 g/dL (ref 6.0–8.5)

## 2014-08-11 ENCOUNTER — Telehealth: Payer: Self-pay | Admitting: Family Medicine

## 2014-08-11 NOTE — Telephone Encounter (Signed)
-----   Message from Chipper Herb, MD sent at 08/11/2014  6:59 AM EST ----- The blood sugar slightly elevated at 101. The creatinine, the most important kidney function test is within normal limits. The electrolytes including potassium are within normal limits. All LFTs are within normal limits All cholesterol numbers with advanced lipid testing are excellent and at goal, continue current treatment and as aggressive therapeutic lifestyle changes as possible The vitamin D level is stable and good, continue current treatment

## 2014-10-19 ENCOUNTER — Encounter: Payer: Self-pay | Admitting: *Deleted

## 2014-11-03 ENCOUNTER — Other Ambulatory Visit: Payer: Self-pay | Admitting: *Deleted

## 2014-11-03 DIAGNOSIS — R911 Solitary pulmonary nodule: Secondary | ICD-10-CM

## 2014-11-10 ENCOUNTER — Ambulatory Visit (HOSPITAL_COMMUNITY)
Admission: RE | Admit: 2014-11-10 | Discharge: 2014-11-10 | Disposition: A | Discharge status: Home / Self Care | Payer: Medicare Other | Source: Ambulatory Visit | Attending: Family Medicine | Admitting: Family Medicine

## 2014-11-10 DIAGNOSIS — I2510 Atherosclerotic heart disease of native coronary artery without angina pectoris: Secondary | ICD-10-CM | POA: Insufficient documentation

## 2014-11-10 DIAGNOSIS — Z87891 Personal history of nicotine dependence: Secondary | ICD-10-CM | POA: Insufficient documentation

## 2014-11-10 DIAGNOSIS — J449 Chronic obstructive pulmonary disease, unspecified: Secondary | ICD-10-CM | POA: Diagnosis not present

## 2014-11-10 DIAGNOSIS — R911 Solitary pulmonary nodule: Secondary | ICD-10-CM | POA: Insufficient documentation

## 2014-11-14 ENCOUNTER — Other Ambulatory Visit: Payer: Self-pay

## 2014-11-14 DIAGNOSIS — I2510 Atherosclerotic heart disease of native coronary artery without angina pectoris: Secondary | ICD-10-CM

## 2014-11-14 DIAGNOSIS — I2584 Coronary atherosclerosis due to calcified coronary lesion: Principal | ICD-10-CM

## 2014-11-15 ENCOUNTER — Other Ambulatory Visit: Payer: Self-pay | Admitting: Family Medicine

## 2014-11-15 NOTE — Telephone Encounter (Signed)
Last seen 08/09/14 [facility]  IF approved route to nurse to call into [facility]

## 2014-11-16 NOTE — Telephone Encounter (Signed)
Left authorization on voicemail 

## 2014-11-16 NOTE — Telephone Encounter (Signed)
This is okay to refill with 2 refills

## 2014-12-18 ENCOUNTER — Institutional Professional Consult (permissible substitution): Payer: Medicare Other | Admitting: Cardiovascular Disease

## 2014-12-19 ENCOUNTER — Encounter: Payer: Self-pay | Admitting: Family Medicine

## 2014-12-19 ENCOUNTER — Ambulatory Visit (INDEPENDENT_AMBULATORY_CARE_PROVIDER_SITE_OTHER): Payer: Medicare Other | Admitting: Family Medicine

## 2014-12-19 VITALS — BP 126/100 | HR 120 | Temp 98.0°F | Ht 64.0 in | Wt 185.0 lb

## 2014-12-19 DIAGNOSIS — M179 Osteoarthritis of knee, unspecified: Secondary | ICD-10-CM

## 2014-12-19 DIAGNOSIS — E785 Hyperlipidemia, unspecified: Secondary | ICD-10-CM | POA: Diagnosis not present

## 2014-12-19 DIAGNOSIS — E559 Vitamin D deficiency, unspecified: Secondary | ICD-10-CM

## 2014-12-19 DIAGNOSIS — E8881 Metabolic syndrome: Secondary | ICD-10-CM

## 2014-12-19 DIAGNOSIS — N400 Enlarged prostate without lower urinary tract symptoms: Secondary | ICD-10-CM | POA: Diagnosis not present

## 2014-12-19 DIAGNOSIS — M1710 Unilateral primary osteoarthritis, unspecified knee: Secondary | ICD-10-CM

## 2014-12-19 DIAGNOSIS — IMO0002 Reserved for concepts with insufficient information to code with codable children: Secondary | ICD-10-CM

## 2014-12-19 DIAGNOSIS — Z0000 Encounter for general adult medical examination without abnormal findings: Secondary | ICD-10-CM | POA: Diagnosis not present

## 2014-12-19 DIAGNOSIS — I10 Essential (primary) hypertension: Secondary | ICD-10-CM

## 2014-12-19 DIAGNOSIS — I6529 Occlusion and stenosis of unspecified carotid artery: Secondary | ICD-10-CM | POA: Diagnosis not present

## 2014-12-19 LAB — POCT CBC
Granulocyte percent: 71.9 %G (ref 37–80)
HCT, POC: 57.5 % — AB (ref 43.5–53.7)
Hemoglobin: 18.1 g/dL (ref 14.1–18.1)
LYMPH, POC: 3 (ref 0.6–3.4)
MCH, POC: 28.7 pg (ref 27–31.2)
MCHC: 31.4 g/dL — AB (ref 31.8–35.4)
MCV: 91.5 fL (ref 80–97)
MPV: 8 fL (ref 0–99.8)
PLATELET COUNT, POC: 296 10*3/uL (ref 142–424)
POC Granulocyte: 9 — AB (ref 2–6.9)
POC LYMPH PERCENT: 24.2 %L (ref 10–50)
RBC: 6.28 M/uL — AB (ref 4.69–6.13)
RDW, POC: 13.5 %
WBC: 12.5 10*3/uL — AB (ref 4.6–10.2)

## 2014-12-19 LAB — POCT UA - MICROSCOPIC ONLY
Casts, Ur, LPF, POC: NEGATIVE
Crystals, Ur, HPF, POC: NEGATIVE
Epithelial cells, urine per micros: NEGATIVE
Yeast, UA: NEGATIVE

## 2014-12-19 LAB — POCT URINALYSIS DIPSTICK
BILIRUBIN UA: NEGATIVE
GLUCOSE UA: NEGATIVE
LEUKOCYTES UA: NEGATIVE
NITRITE UA: NEGATIVE
PH UA: 6
Protein, UA: NEGATIVE
Spec Grav, UA: 1.02
Urobilinogen, UA: NEGATIVE

## 2014-12-19 MED ORDER — HYDROCODONE-ACETAMINOPHEN 5-325 MG PO TABS: 1.0000 | ORAL_TABLET | Freq: Four times a day (QID) | ORAL | Status: DC | PRN

## 2014-12-19 NOTE — Progress Notes (Signed)
Subjective:    Patient ID: Joshua Bowman, male    DOB: 08/01/1946, 69 y.o.   MRN: [id number]  HPI Patient is here today for annual wellness exam and follow up of chronic medical problems which includes hypertension and hyperlipidemia. He is taking medications regularly. Patient continues to have problems with his arthritis especially his back and his left knee. Please see the orthopedic surgeon about this in the past and is still delaying having surgery on his knees.        Patient Active Problem List   Diagnosis Date Noted  . BPH (benign prostatic hyperplasia) 12/06/2013  . Metabolic syndrome 81/85/6314  . Carotid artery stenosis 12/06/2013  . Abnormal chest CT 08/17/2013  . Hypertension 03/30/2013  . Degenerative arthritis 03/30/2013  . Hyperlipidemia 03/30/2013  . Personal history of adenomatous colonic polyps 05/06/2012   Outpatient Encounter Prescriptions as of 12/19/2014  Medication Sig  . albuterol (PROAIR HFA) 108 (90 BASE) MCG/ACT inhaler INHALE ONE OR TWO PUFFS BY MOUTH FOUR TIMES DAILY AS NEEDED  . aspirin 325 MG tablet Take 325 mg by mouth daily.  . Cholecalciferol (VITAMIN D3) 5000 UNITS CAPS Take 1 tablet by mouth. 2-3 x week  . CRESTOR 40 MG tablet TAKE ONE TABLET BY MOUTH EVERY DAY AS DIRECTED  . HYDROcodone-acetaminophen (NORCO/VICODIN) 5-325 MG per tablet Take 1 tablet by mouth every 6 (six) hours as needed.  [facility] LORazepam (ATIVAN) 0.5 MG tablet TAKE ONE TABLET BY MOUTH DAILY AS NEEDED  . SYMBICORT 160-4.5 MCG/ACT inhaler INHALE 2 PUFFS BY MOUTH INTO THE LUNGS TWICE A DAY (RINSE MOUTH AFTER EACH USE)  . ZETIA 10 MG tablet TAKE ONE TABLET BY MOUTH ONE TIME DAILY  . [DISCONTINUED] HYDROcodone-acetaminophen (NORCO/VICODIN) 5-325 MG per tablet Take 1 tablet by mouth every 6 (six) hours as needed.    Review of Systems  Constitutional: Negative.   HENT: Negative.   Eyes: Negative.   Respiratory: Negative.   Cardiovascular: Negative.   Gastrointestinal: Negative.    Endocrine: Negative.   Genitourinary: Negative.   Musculoskeletal: Positive for back pain and arthralgias (bilateral knee pain - left is worse).  Skin: Negative.   Allergic/Immunologic: Negative.   Neurological: Negative.   Hematological: Negative.   Psychiatric/Behavioral: Negative.        Objective:   Physical Exam  Constitutional: He is oriented to person, place, and time. He appears well-developed and well-nourished. No distress.  HENT:  Head: Normocephalic and atraumatic.  Right Ear: External ear normal.  Left Ear: External ear normal.  Mouth/Throat: Oropharynx is clear and moist. No oropharyngeal exudate.  Nasal congestion right greater than left  Eyes: Conjunctivae and EOM are normal. Pupils are equal, round, and reactive to light. Right eye exhibits no discharge. Left eye exhibits no discharge. No scleral icterus.  Neck: Normal range of motion. Neck supple. No thyromegaly present.  No carotid bruits or anterior cervical adenopathy  Cardiovascular: Normal rate, regular rhythm, normal heart sounds and intact distal pulses.  Exam reveals no gallop and no friction rub.   No murmur heard. The rhythm was regular at 96/m  Pulmonary/Chest: Effort normal and breath sounds normal. No respiratory distress. He has no wheezes. He has no rales. He exhibits no tenderness.  Slightly diminished breath sounds bilaterally but no congestion wheezing or congestion with coughing.  Abdominal: Soft. Bowel sounds are normal. He exhibits no mass. There is no tenderness. There is no rebound and no guarding.  The abdomen was nontender without masses or organ enlargement  Genitourinary:  Rectum normal, prostate normal and penis normal.  The prostate was enlarged without lumps or masses. The rectum was negative for masses. There were no inguinal hernias palpated external genitalia were within normal limits  Musculoskeletal: Normal range of motion. He exhibits no edema or tenderness.  Both knees are  swollen without rubor. There is no joint line tenderness  Lymphadenopathy:    He has no cervical adenopathy.  Neurological: He is alert and oriented to person, place, and time. He has normal reflexes. No cranial nerve deficit.  Skin: Skin is warm and dry. No rash noted. No erythema. No pallor.  Psychiatric: He has a normal mood and affect. His behavior is normal. Judgment and thought content normal.  Nursing note and vitals reviewed.  BP 165/104 mmHg  Pulse 120  Temp(Src) 98 F (36.7 C) (Oral)  Ht 5' 4" (1.626 m)  Wt 185 lb (83.915 kg)  BMI 31.74 kg/m2        Assessment & Plan:  1. Vitamin D deficiency -Continue current vitamin D pending results of lab work - POCT CBC - Vit D  25 hydroxy (rtn osteoporosis monitoring)  2. Hyperlipemia -Continue current cholesterol medication pending results of lab work - POCT CBC - NMR, lipoprofile  3. Essential hypertension -The blood pressure remains elevated always in the office but it is always good at home. The patient is going to bring his monitor here for us to compare to our monitor and we will decide what to do about her blood pressure once we compare monitors. Continue to watch his sodium intake. - POCT CBC - BMP8+EGFR - Hepatic function panel  4. Metabolic syndrome -The patient should continue with aggressive diet habits and is much exercise as his knees will allow him to get. - POCT CBC - BMP8+EGFR  5. BPH (benign prostatic hyperplasia) -The prostate is enlarged but there was no lumps or masses and the patient is having no specific symptoms with passing his water. - POCT CBC - PSA, total and free - POCT UA - Microscopic Only - POCT urinalysis dipstick  6. Carotid artery stenosis, unspecified laterality - POCT CBC - NMR, lipoprofile  7. Annual physical exam - POCT CBC - BMP8+EGFR - Hepatic function panel - NMR, lipoprofile - PSA, total and free - Vit D  25 hydroxy (rtn osteoporosis monitoring) - POCT UA -  Microscopic Only - POCT urinalysis dipstick  8. Osteoarthrosis, unspecified whether generalized or localized, involving lower leg -The patient continues to take pain medicine for his knees and is seriously considering seeing the orthopedic surgeon about getting something more aggressive done. - HYDROcodone-acetaminophen (NORCO/VICODIN) 5-325 MG per tablet; Take 1 tablet by mouth every 6 (six) hours as needed.  Dispense: 120 tablet; Refill: 0  9. COPD -The patient just had his second CT scan and pulmonary nodules that were found on a previous CT scan did not change and the radiologist did not feel like any further studies were warranted. However there was coronary artery calcium on the CT scan and we are recommending that the patient see the cardiologist for further evaluation  Meds ordered this encounter  Medications  . HYDROcodone-acetaminophen (NORCO/VICODIN) 5-325 MG per tablet    Sig: Take 1 tablet by mouth every 6 (six) hours as needed.    Dispense:  120 tablet    Refill:  0   Patient Instructions                       Medicare Annual Wellness   Visit  [facility] and the medical providers at [facility] strive to bring you the best medical care.  In doing so we not only want to address your current medical conditions and concerns but also to detect new conditions early and prevent illness, disease and health-related problems.    Medicare offers a yearly Wellness Visit which allows our clinical staff to assess your need for preventative services including immunizations, lifestyle education, counseling to decrease risk of preventable diseases and screening for fall risk and other medical concerns.    This visit is provided free of charge (no copay) for all Medicare recipients. The clinical pharmacists at [facility] have begun to conduct these Wellness Visits which will also include a thorough review of all your medications.    As you primary  medical provider recommend that you make an appointment for your Annual Wellness Visit if you have not done so already this year.  You may set up this appointment before you leave today or you may call back ([phone]) and schedule an appointment.  Please make sure when you call that you mention that you are scheduling your Annual Wellness Visit with the clinical pharmacist so that the appointment may be made for the proper length of time.     Continue current medications. Continue good therapeutic lifestyle changes which include good diet and exercise. Fall precautions discussed with patient. If an FOBT was given today- please return it to our front desk. If you are over 70 years old - you may need Prevnar 59 or the adult Pneumonia vaccine.  Flu Shots are still available at our office. If you still haven't had one please call to set up a nurse visit to get one.   After your visit with [place] today you will receive a survey in the mail or online from [employer] regarding your care with [place]. Please take a moment to fill this out. Your feedback is very important to [place] as you can help [place] better understand your patient needs as well as improve your experience and satisfaction. WE CARE ABOUT YOU!!!   Continue to monitor blood pressures at home and bring readings in for review Also bring your monitor in to compare to our monitor to make sure that we are getting the same readings Continue to watch her salt intake We will arrange for you to have a visit with Dr. Percival Spanish the cardiologist to evaluate coronary artery calcium buildup secondary to the CT scan that you have had. The repeat CT scan that we did recently was stable and no further follow-up is necessary according to the radiologist. The patient should let us know if and when he would like to see the orthopedic surgeon for further evaluation of his end-stage knee problems with arthritis   Arrie Senate MD

## 2014-12-19 NOTE — Addendum Note (Signed)
Addended by: Zannie Cove on: 12/19/2014 04:45 PM   Modules accepted: Orders

## 2014-12-19 NOTE — Patient Instructions (Addendum)
Medicare Annual Wellness Visit  [facility] and the medical providers at [facility] strive to bring you the best medical care.  In doing so we not only want to address your current medical conditions and concerns but also to detect new conditions early and prevent illness, disease and health-related problems.    Medicare offers a yearly Wellness Visit which allows our clinical staff to assess your need for preventative services including immunizations, lifestyle education, counseling to decrease risk of preventable diseases and screening for fall risk and other medical concerns.    This visit is provided free of charge (no copay) for all Medicare recipients. The clinical pharmacists at [facility] have begun to conduct these Wellness Visits which will also include a thorough review of all your medications.    As you primary medical provider recommend that you make an appointment for your Annual Wellness Visit if you have not done so already this year.  You may set up this appointment before you leave today or you may call back ([phone]) and schedule an appointment.  Please make sure when you call that you mention that you are scheduling your Annual Wellness Visit with the clinical pharmacist so that the appointment may be made for the proper length of time.     Continue current medications. Continue good therapeutic lifestyle changes which include good diet and exercise. Fall precautions discussed with patient. If an FOBT was given today- please return it to our front desk. If you are over 45 years old - you may need Prevnar 42 or the adult Pneumonia vaccine.  Flu Shots are still available at our office. If you still haven't had one please call to set up a nurse visit to get one.   After your visit with [place] today you will receive a survey in the mail or online from [employer] regarding your care with [place]. Please take a moment to  fill this out. Your feedback is very important to [place] as you can help [place] better understand your patient needs as well as improve your experience and satisfaction. WE CARE ABOUT YOU!!!   Continue to monitor blood pressures at home and bring readings in for review Also bring your monitor in to compare to our monitor to make sure that we are getting the same readings Continue to watch her salt intake We will arrange for you to have a visit with Dr. Percival Spanish the cardiologist to evaluate coronary artery calcium buildup secondary to the CT scan that you have had. The repeat CT scan that we did recently was stable and no further follow-up is necessary according to the radiologist. The patient should let us know if and when he would like to see the orthopedic surgeon for further evaluation of his end-stage knee problems with arthritis

## 2014-12-20 LAB — BMP8+EGFR
BUN/Creatinine Ratio: 13 (ref 10–22)
BUN: 13 mg/dL (ref 8–27)
CALCIUM: 10.1 mg/dL (ref 8.6–10.2)
CO2: 23 mmol/L (ref 18–29)
Chloride: 98 mmol/L (ref 97–108)
Creatinine, Ser: 1 mg/dL (ref 0.76–1.27)
GFR calc non Af Amer: 77 mL/min/{1.73_m2} (ref >59)
GFR, EST AFRICAN AMERICAN: 89 mL/min/{1.73_m2} (ref >59)
GLUCOSE: 96 mg/dL (ref 65–99)
POTASSIUM: 4.7 mmol/L (ref 3.5–5.2)
Sodium: 137 mmol/L (ref 134–144)

## 2014-12-20 LAB — NMR, LIPOPROFILE
Cholesterol: 130 mg/dL (ref 100–199)
HDL Cholesterol by NMR: 52 mg/dL (ref >39)
HDL PARTICLE NUMBER: 36.5 umol/L (ref >=30.5)
LDL Particle Number: 911 nmol/L (ref <1000)
LDL Size: 19.9 nm (ref >20.5)
LDL-C: 65 mg/dL (ref 0–99)
LP-IR SCORE: 34 (ref <=45)
SMALL LDL PARTICLE NUMBER: 585 nmol/L — AB (ref <=527)
Triglycerides by NMR: 66 mg/dL (ref 0–149)

## 2014-12-20 LAB — HEPATIC FUNCTION PANEL
ALBUMIN: 4.4 g/dL (ref 3.6–4.8)
ALT: 14 IU/L (ref 0–44)
AST: 19 IU/L (ref 0–40)
Alkaline Phosphatase: 53 IU/L (ref 39–117)
BILIRUBIN TOTAL: 0.4 mg/dL (ref 0.0–1.2)
Bilirubin, Direct: 0.14 mg/dL (ref 0.00–0.40)
Total Protein: 6.7 g/dL (ref 6.0–8.5)

## 2014-12-20 LAB — VITAMIN D 25 HYDROXY (VIT D DEFICIENCY, FRACTURES): Vit D, 25-Hydroxy: 36.3 ng/mL (ref 30.0–100.0)

## 2014-12-20 LAB — PSA, TOTAL AND FREE
PSA, Free Pct: 17 %
PSA, Free: 0.34 ng/mL
PSA: 2 ng/mL (ref 0.0–4.0)

## 2014-12-22 ENCOUNTER — Ambulatory Visit: Payer: Medicare Other | Admitting: Family Medicine

## 2015-01-01 ENCOUNTER — Other Ambulatory Visit: Payer: Medicare Other

## 2015-01-01 DIAGNOSIS — Z1212 Encounter for screening for malignant neoplasm of rectum: Secondary | ICD-10-CM | POA: Diagnosis not present

## 2015-01-01 NOTE — Progress Notes (Signed)
Lab only 

## 2015-01-03 LAB — FECAL OCCULT BLOOD, IMMUNOCHEMICAL: Fecal Occult Bld: NEGATIVE

## 2015-02-07 ENCOUNTER — Encounter: Payer: Self-pay | Admitting: Cardiology

## 2015-02-07 ENCOUNTER — Ambulatory Visit (INDEPENDENT_AMBULATORY_CARE_PROVIDER_SITE_OTHER): Payer: Medicare Other | Admitting: Cardiology

## 2015-02-07 VITALS — BP 122/90 | HR 102 | Ht 66.0 in | Wt 185.0 lb

## 2015-02-07 DIAGNOSIS — I2584 Coronary atherosclerosis due to calcified coronary lesion: Secondary | ICD-10-CM | POA: Diagnosis not present

## 2015-02-07 DIAGNOSIS — I2510 Atherosclerotic heart disease of native coronary artery without angina pectoris: Secondary | ICD-10-CM

## 2015-02-07 NOTE — Patient Instructions (Signed)
Medication Instructions:  Your physician recommends that you continue on your current medications as directed. Please refer to the Current Medication list given to you today.  Testing/Procedures: Your physician has requested that you have an exercise tolerance test. For further information please visit [email or web address]. Please also follow instruction sheet, as given.  Follow-Up: As needed after testing.  Thank you for choosing [facility]!!

## 2015-02-07 NOTE — Progress Notes (Signed)
Cardiology Office Note   Date:  02/07/2015   ID:  Jaycion, Treml March 14, 1946, MRN [id number]  PCP:  Redge Gainer, MD  Cardiologist:   Minus Breeding, MD   Chief Complaint  Patient presents with  . Coronary Calcium      History of Present Illness: TSUTOMU BARFOOT is a 69 y.o. male who presents for evaluation of coronary artery calcium. This was found on the CT when he had recent follow-up of pulmonary nodules. He denies any cardiovascular symptoms other than some shortness of breath which has been chronic and he relates to his chronic lung disease and smoking. He can do activities such as walking behind his lawnmower. He is somewhat limited by joint pains. However, with his activities the patient denies any new symptoms such as chest discomfort, neck or arm discomfort. There has been no PND or orthopnea. There have been no reported palpitations, presyncope or syncope.  He reports no prior cardiac history but has had treadmill test he believes many years ago.  Past Medical History  Diagnosis Date  . Hyperplasia of prostate   . Colon polyps     multiple   . Gastritis   . Duodenitis   . Bilateral carotid artery stenosis     Minimal 2012   . Sigmoid diverticulosis     mild   . Hemorrhoids   . IBS (irritable bowel syndrome)   . Pre-diabetes   . Anxiety   . Arthritis   . COPD (chronic obstructive pulmonary disease)   . Hyperlipidemia     Past Surgical History  Procedure Laterality Date  . Appendectomy  1969  . Nasal surgery ( spurs )  70's  . Lipoma rt shoulder  01/2001    outpatient   . Rt. shoulder -rotator cuff repair  08/2005    Dr. Maxie Better      Current Outpatient Prescriptions  Medication Sig Dispense Refill  . albuterol (PROAIR HFA) 108 (90 BASE) MCG/ACT inhaler INHALE ONE OR TWO PUFFS BY MOUTH FOUR TIMES DAILY AS NEEDED 18 g 4  . aspirin 325 MG tablet Take 325 mg by mouth daily.    . Cholecalciferol (VITAMIN D3) 5000 UNITS CAPS Take 1 tablet by mouth. 2-3 x  week    . CRESTOR 40 MG tablet TAKE ONE TABLET BY MOUTH EVERY DAY AS DIRECTED 30 tablet 3  . HYDROcodone-acetaminophen (NORCO/VICODIN) 5-325 MG per tablet Take 1 tablet by mouth every 6 (six) hours as needed. (Patient taking differently: Take 1 tablet by mouth every 6 (six) hours as needed for moderate pain (knee and joint). ) 120 tablet 0  . LORazepam (ATIVAN) 0.5 MG tablet TAKE ONE TABLET BY MOUTH DAILY AS NEEDED 30 tablet 1  . SYMBICORT 160-4.5 MCG/ACT inhaler INHALE 2 PUFFS BY MOUTH INTO THE LUNGS TWICE A DAY (RINSE MOUTH AFTER EACH USE) 11 g 5  . ZETIA 10 MG tablet TAKE ONE TABLET BY MOUTH ONE TIME DAILY 30 tablet 5   No current facility-administered medications for this visit.    Allergies:   Lipitor and Sulfa antibiotics    Social History:  The patient  reports that he has been smoking Cigarettes.  He started smoking about 34 years ago. He has been smoking about 0.50 packs per day. He has quit using smokeless tobacco. He reports that he does not drink alcohol or use illicit drugs.   Family History:  The patient's family history includes Heart disease in his mother; Prostate cancer in his brother.  ROS:  Please see the history of present illness.   Otherwise, review of systems are positive for back pain, joint pain, occasional tinnitus..   All other systems are reviewed and negative.    PHYSICAL EXAM: VS:  BP 122/90 mmHg  Pulse 102  Ht 5\' 6"  (1.676 m)  Wt 185 lb (83.915 kg)  BMI 29.87 kg/m2 , BMI Body mass index is 29.87 kg/(m^2). GENERAL:  Well appearing HEENT:  Pupils equal round and reactive, fundi not visualized, oral mucosa unremarkable NECK:  No jugular venous distention, waveform within normal limits, carotid upstroke brisk and symmetric, no bruits, no thyromegaly LYMPHATICS:  No cervical, inguinal adenopathy LUNGS:  Clear to auscultation bilaterally BACK:  No CVA tenderness CHEST:  Unremarkable HEART:  PMI not displaced or sustained,S1 and S2 within normal limits, no  S3, no S4, no clicks, no rubs, no murmurs ABD:  Flat, positive bowel sounds normal in frequency in pitch, no bruits, no rebound, no guarding, no midline pulsatile mass, no hepatomegaly, no splenomegaly EXT:  2 plus pulses throughout, no edema, no cyanosis no clubbing SKIN:  No rashes no nodules NEURO:  Cranial nerves II through XII grossly intact, motor grossly intact throughout PSYCH:  Cognitively intact, oriented to person place and time    EKG:  EKG is ordered today. The ekg ordered today demonstrates sinus tachycardia, rate 102, axis within normal limits, QTC prolonged, no acute ST-T wave changes.   Recent Labs: 12/19/2014: ALT 14; BUN 13; Creatinine 1.00; Hemoglobin 18.1; Potassium 4.7; Sodium 137    Wt Readings from Last 3 Encounters:  02/07/15 185 lb (83.915 kg)  12/19/14 185 lb (83.915 kg)  08/09/14 188 lb (85.276 kg)      Other studies Reviewed: Additional studies/ records that were reviewed today include: Dr. Tawanna Sat office records. I did personally review the CT images.. Review of the above records demonstrates:  Please see elsewhere in the note.      ASSESSMENT AND PLAN:  CORONARY CALCIUM:  The patient does have coronary calcium noted. He has risk factors with smoking. However, he's having no symptoms. I will bring the patient back for a POET (Plain Old Exercise Test). This will allow me to screen for obstructive coronary disease, risk stratify and very importantly provide a prescription for exercise.  TOBACCO ABUSE:  We had a long discussion about how to stop smoking. He does not want to use Chantix. He will consider the patches and gum.  DYSLIPIDEMIA:  LDL was 65 with an HDL 52 earlier this year. No change in therapy is indicated.  Current medicines are reviewed at length with the patient today.  The patient does not have concerns regarding medicines.  The following changes have been made:  no change  Labs/ tests ordered today include:   Orders Placed This  Encounter  Procedures  . Exercise Tolerance Test  . EKG 12-Lead     Disposition:   FU with me as needed.      Signed, Minus Breeding, MD  02/07/2015 11:49 AM    [facility]

## 2015-02-27 ENCOUNTER — Other Ambulatory Visit: Payer: Self-pay | Admitting: Family Medicine

## 2015-02-27 NOTE — Telephone Encounter (Signed)
Last filled 01/23/15, last seen 12/19/14. Call into [facility]

## 2015-03-27 ENCOUNTER — Encounter: Payer: Self-pay | Admitting: Nurse Practitioner

## 2015-04-09 ENCOUNTER — Telehealth: Payer: Self-pay | Admitting: Family Medicine

## 2015-04-09 DIAGNOSIS — IMO0002 Reserved for concepts with insufficient information to code with codable children: Secondary | ICD-10-CM

## 2015-04-09 DIAGNOSIS — M1710 Unilateral primary osteoarthritis, unspecified knee: Secondary | ICD-10-CM

## 2015-04-09 NOTE — Telephone Encounter (Signed)
Last seen and filled 12/19/14. Rx will print

## 2015-04-10 MED ORDER — HYDROCODONE-ACETAMINOPHEN 5-325 MG PO TABS: 1.0000 | ORAL_TABLET | Freq: Four times a day (QID) | ORAL | Status: DC | PRN

## 2015-04-10 NOTE — Telephone Encounter (Signed)
This is okay to refill 

## 2015-04-10 NOTE — Telephone Encounter (Signed)
lmovm written Rx will be at our front office by the end of today for pickup

## 2015-04-12 ENCOUNTER — Other Ambulatory Visit: Payer: Self-pay | Admitting: Family Medicine

## 2015-05-04 ENCOUNTER — Encounter: Payer: Self-pay | Admitting: Family Medicine

## 2015-05-04 ENCOUNTER — Ambulatory Visit (INDEPENDENT_AMBULATORY_CARE_PROVIDER_SITE_OTHER): Payer: Medicare Other | Admitting: Family Medicine

## 2015-05-04 VITALS — BP 173/96 | HR 98 | Temp 98.1°F | Ht 66.0 in | Wt 184.0 lb

## 2015-05-04 DIAGNOSIS — E559 Vitamin D deficiency, unspecified: Secondary | ICD-10-CM

## 2015-05-04 DIAGNOSIS — I6529 Occlusion and stenosis of unspecified carotid artery: Secondary | ICD-10-CM

## 2015-05-04 DIAGNOSIS — M25559 Pain in unspecified hip: Secondary | ICD-10-CM | POA: Diagnosis not present

## 2015-05-04 DIAGNOSIS — I10 Essential (primary) hypertension: Secondary | ICD-10-CM | POA: Diagnosis not present

## 2015-05-04 DIAGNOSIS — E8881 Metabolic syndrome: Secondary | ICD-10-CM

## 2015-05-04 DIAGNOSIS — E785 Hyperlipidemia, unspecified: Secondary | ICD-10-CM | POA: Diagnosis not present

## 2015-05-04 DIAGNOSIS — M25569 Pain in unspecified knee: Secondary | ICD-10-CM

## 2015-05-04 DIAGNOSIS — N400 Enlarged prostate without lower urinary tract symptoms: Secondary | ICD-10-CM

## 2015-05-04 DIAGNOSIS — J301 Allergic rhinitis due to pollen: Secondary | ICD-10-CM | POA: Diagnosis not present

## 2015-05-04 LAB — POCT CBC
GRANULOCYTE PERCENT: 72.3 % (ref 37–80)
HCT, POC: 52.9 % (ref 43.5–53.7)
HEMOGLOBIN: 17 g/dL (ref 14.1–18.1)
LYMPH, POC: 2 (ref 0.6–3.4)
MCH, POC: 29.1 pg (ref 27–31.2)
MCHC: 32.2 g/dL (ref 31.8–35.4)
MCV: 90.6 fL (ref 80–97)
MPV: 7.8 fL (ref 0–99.8)
POC Granulocyte: 7.2 — AB (ref 2–6.9)
POC LYMPH %: 20.4 % (ref 10–50)
Platelet Count, POC: 239 10*3/uL (ref 142–424)
RBC: 5.84 M/uL (ref 4.69–6.13)
RDW, POC: 13.8 %
WBC: 10 10*3/uL (ref 4.6–10.2)

## 2015-05-04 MED ORDER — LORAZEPAM 0.5 MG PO TABS: 0.5000 mg | ORAL_TABLET | Freq: Every day | ORAL | Status: DC | PRN

## 2015-05-04 MED ORDER — FLUTICASONE PROPIONATE 50 MCG/ACT NA SUSP: 2.0000 | Freq: Every day | NASAL | Status: DC

## 2015-05-04 NOTE — Progress Notes (Signed)
Subjective:    Patient ID: Joshua Bowman, male    DOB: 03/01/1946, 69 y.o.   MRN: [id number]  HPI Pt here for follow up and management of chronic medical problems which includes hypertension and hyperlipidemia. He is taking medications regularly. The patient maintained complaints today or arthralgias. He has end-stage degenerative joint disease of his knees. He continues to postpone getting his knee replacement. His last chest CT was done in January of this year and the nodules that were being followed up. To be stable and no further follow-up is necessary. He did show some coronary artery calcifications and some emphysematous changes. I will give the patient a copy of his report and review it with him again during the visit. He brings in outside blood pressures for review and all of these are excellent. He seems to have white coat hypertension because all the readings that he has when he comes to the office are always elevated. He is due to get lab work today and is requesting a refill on his lorazepam. The patient denies chest pain shortness of breath other than his usual COPD GI symptoms other than occasional swallowing problems and denies blood in the stool or black tarry bowel movements. He is passing his water without problems. The CT scan did show some coronary artery calcification and he did see the cardiologist but was not able do a regular stress test due to the arthritic problems in his knees.      Patient Active Problem List   Diagnosis Date Noted  . Coronary artery calcification 02/07/2015  . BPH (benign prostatic hyperplasia) 12/06/2013  . Metabolic syndrome 12/06/2013  . Carotid artery stenosis 12/06/2013  . Abnormal chest CT 08/17/2013  . Hypertension 03/30/2013  . Degenerative arthritis 03/30/2013  . Hyperlipidemia 03/30/2013  . Personal history of adenomatous colonic polyps 05/06/2012   Outpatient Encounter Prescriptions as of 05/04/2015  Medication Sig  . albuterol  (PROAIR HFA) 108 (90 BASE) MCG/ACT inhaler INHALE ONE OR TWO PUFFS BY MOUTH FOUR TIMES DAILY AS NEEDED  . aspirin 325 MG tablet Take 325 mg by mouth daily.  . Cholecalciferol (VITAMIN D3) 5000 UNITS CAPS Take 1 tablet by mouth. 2-3 x week  . CRESTOR 40 MG tablet TAKE ONE TABLET BY MOUTH EVERY DAY AS DIRECTED  . HYDROcodone-acetaminophen (NORCO/VICODIN) 5-325 MG per tablet Take 1 tablet by mouth every 6 (six) hours as needed.  Mar[facility] LORazepam (ATIVAN) 0.5 MG tablet Take 1 tablet (0.5 mg total) by mouth daily as needed.  . SYMBICORT 160-4.5 MCG/ACT inhaler INHALE 2 PUFFS BY MOUTH INTO THE LUNGS TWICE A DAY (RINSE MOUTH AFTER EACH USE)  . ZETIA 10 MG tablet TAKE ONE TABLET BY MOUTH ONE TIME DAILY  . [DISCONTINUED] LORazepam (ATIVAN) 0.5 MG tablet TAKE ONE TABLET BY MOUTH DAILY AS NEEDED   No facility-administered encounter medications on file as of 05/04/2015.     Review of Systems  Constitutional: Negative.   HENT: Negative.   Eyes: Negative.   Respiratory: Negative.   Cardiovascular: Negative.   Gastrointestinal: Negative.   Endocrine: Negative.   Genitourinary: Negative.   Musculoskeletal: Positive for arthralgias.  Skin: Negative.   Allergic/Immunologic: Negative.   Neurological: Negative.   Hematological: Negative.   Psychiatric/Behavioral: Negative.        Objective:   Physical Exam  Constitutional: He is oriented to person, place, and time. He appears well-developed and well-nourished. No distress.  Pleasant and alert  HENT:  Head: Normocephalic and atraumatic.  Right Ear: External  ear normal.  Left Ear: External ear normal.  Mouth/Throat: Oropharynx is clear and moist. No oropharyngeal exudate.  Nasal congestion and turbinate swelling bilaterally  Eyes: Conjunctivae and EOM are normal. Pupils are equal, round, and reactive to light. Right eye exhibits no discharge. Left eye exhibits no discharge. No scleral icterus.  Neck: Normal range of motion. Neck supple. No thyromegaly  present.  No carotid bruits audible today no thyromegaly and no anterior cervical adenopathy  Cardiovascular: Normal rate, regular rhythm, normal heart sounds and intact distal pulses.   No murmur heard. The rhythm was regular at 84/m  Pulmonary/Chest: Effort normal and breath sounds normal. No respiratory distress. He has no wheezes. He has no rales. He exhibits no tenderness.  Clear anteriorly and posteriorly with coughing slightly tight cough otherwise normal chest sounds  Abdominal: Soft. Bowel sounds are normal. He exhibits no mass. There is no tenderness. There is no rebound and no guarding.  No epigastric tenderness or organ enlargement or masses  Musculoskeletal: Normal range of motion. He exhibits no edema or tenderness.  There is swelling and rigidity in both knees.  Lymphadenopathy:    He has no cervical adenopathy.  Neurological: He is alert and oriented to person, place, and time. He has normal reflexes. No cranial nerve deficit.  Skin: Skin is warm and dry. No rash noted.  Psychiatric: He has a normal mood and affect. His behavior is normal. Judgment and thought content normal.  Nursing note and vitals reviewed.  BP 173/96 mmHg  Pulse 98  Temp(Src) 98.1 F (36.7 C) (Oral)  Ht $R'5\' 6"'hj$  (1.676 m)  Wt 184 lb (83.462 kg)  BMI 29.71 kg/m2        Assessment & Plan:  1. Vitamin D deficiency -The patient should continue with current treatment pending results of lab work - POCT CBC - Vit D  25 hydroxy (rtn osteoporosis monitoring)  2. Hyperlipemia -The patient should continue with his Crestor pending results of lab work - POCT CBC - NMR, lipoprofile  3. Essential hypertension -The blood pressure in this office is always elevated but his home readings are always good. He promises to bring his cuff by to check his blood pressure in this office to correlate and make sure that his monitor is giving correct readings. He will continue to watch his sodium intake and we'll  continue working on his diet and keeping his weight down - POCT CBC - BMP8+EGFR - Hepatic function panel  4. Metabolic syndrome -Continue aggressive therapeutic lifestyle changes - POCT CBC - BMP8+EGFR  5. Carotid artery stenosis, unspecified laterality -No audible carotid bruits today. - POCT CBC - NMR, lipoprofile  6. BPH (benign prostatic hyperplasia) -He is having no symptoms with his voiding - POCT CBC  7. Chronic arthralgias of knees and hips, unspecified laterality -He is continuing to live with the arthralgias in the hips and knees and neck and shoulders and is able to make an appointment with his orthopedic surgeon if he needs more care regarding the knees.  8. Allergic rhinitis due to pollen -He will continue with his Symbicort inhaler but will initiate Flonase nasal spray one to 2 sprays each nostril at bedtime  Meds ordered this encounter  Medications  . LORazepam (ATIVAN) 0.5 MG tablet    Sig: Take 1 tablet (0.5 mg total) by mouth daily as needed.    Dispense:  30 tablet    Refill:  3  . fluticasone (FLONASE) 50 MCG/ACT nasal spray    Sig: Place  2 sprays into both nostrils daily.    Dispense:  16 g    Refill:  6   Patient Instructions                       Medicare Annual Wellness Visit  [facility] and the medical providers at [facility] strive to bring you the best medical care.  In doing so we not only want to address your current medical conditions and concerns but also to detect new conditions early and prevent illness, disease and health-related problems.    Medicare offers a yearly Wellness Visit which allows our clinical staff to assess your need for preventative services including immunizations, lifestyle education, counseling to decrease risk of preventable diseases and screening for fall risk and other medical concerns.    This visit is provided free of charge (no copay) for all Medicare recipients. The clinical pharmacists at  [facility] have begun to conduct these Wellness Visits which will also include a thorough review of all your medications.    As you primary medical provider recommend that you make an appointment for your Annual Wellness Visit if you have not done so already this year.  You may set up this appointment before you leave today or you may call back [phone]) and schedule an appointment.  Please make sure when you call that you mention that you are scheduling your Annual Wellness Visit with the clinical pharmacist so that the appointment may be made for the proper length of time.     Continue current medications. Continue good therapeutic lifestyle changes which include good diet and exercise. Fall precautions discussed with patient. If an FOBT was given today- please return it to our front desk. If you are over 15 years old - you may need Prevnar 80 or the adult Pneumonia vaccine.   After your visit with [place] today you will receive a survey in the mail or online from [employer] regarding your care with [place]. Please take a moment to fill this out. Your feedback is very important to [place] as you can help [place] better understand your patient needs as well as improve your experience and satisfaction. WE CARE ABOUT YOU!!!   Continue to watch sodium intake Be sure and bring your blood pressure monitor over and check your blood pressure here in this office and we will compare to our readings Follow-up with orthopedist as needed B careful about climbing ladders and doing heavy lifting as this will aggravate your arthralgias Be sure and check with your insurance regarding the cost of the tetanus shot or Tdap Use nasal saline during the day and do 1 spray of the Flonase at bedtime each nostril and continue Symbicort and rescue inhaler as needed   Arrie Senate MD

## 2015-05-04 NOTE — Patient Instructions (Addendum)
Medicare Annual Wellness Visit  [facility] and the medical providers at [facility] strive to bring you the best medical care.  In doing so we not only want to address your current medical conditions and concerns but also to detect new conditions early and prevent illness, disease and health-related problems.    Medicare offers a yearly Wellness Visit which allows our clinical staff to assess your need for preventative services including immunizations, lifestyle education, counseling to decrease risk of preventable diseases and screening for fall risk and other medical concerns.    This visit is provided free of charge (no copay) for all Medicare recipients. The clinical pharmacists at [facility] have begun to conduct these Wellness Visits which will also include a thorough review of all your medications.    As you primary medical provider recommend that you make an appointment for your Annual Wellness Visit if you have not done so already this year.  You may set up this appointment before you leave today or you may call back [phone]) and schedule an appointment.  Please make sure when you call that you mention that you are scheduling your Annual Wellness Visit with the clinical pharmacist so that the appointment may be made for the proper length of time.     Continue current medications. Continue good therapeutic lifestyle changes which include good diet and exercise. Fall precautions discussed with patient. If an FOBT was given today- please return it to our front desk. If you are over 71 years old - you may need Prevnar 79 or the adult Pneumonia vaccine.   After your visit with [place] today you will receive a survey in the mail or online from [employer] regarding your care with [place]. Please take a moment to fill this out. Your feedback is very important to [place] as you can help [place] better understand your patient needs as well as  improve your experience and satisfaction. WE CARE ABOUT YOU!!!   Continue to watch sodium intake Be sure and bring your blood pressure monitor over and check your blood pressure here in this office and we will compare to our readings Follow-up with orthopedist as needed B careful about climbing ladders and doing heavy lifting as this will aggravate your arthralgias Be sure and check with your insurance regarding the cost of the tetanus shot or Tdap Use nasal saline during the day and do 1 spray of the Flonase at bedtime each nostril and continue Symbicort and rescue inhaler as needed

## 2015-05-05 LAB — HEPATIC FUNCTION PANEL
ALT: 13 IU/L (ref 0–44)
AST: 18 IU/L (ref 0–40)
Albumin: 4.3 g/dL (ref 3.6–4.8)
Alkaline Phosphatase: 50 IU/L (ref 39–117)
Bilirubin Total: 0.3 mg/dL (ref 0.0–1.2)
Bilirubin, Direct: 0.11 mg/dL (ref 0.00–0.40)
TOTAL PROTEIN: 6.5 g/dL (ref 6.0–8.5)

## 2015-05-05 LAB — BMP8+EGFR
BUN/Creatinine Ratio: 15 (ref 10–22)
BUN: 14 mg/dL (ref 8–27)
CALCIUM: 9.7 mg/dL (ref 8.6–10.2)
CHLORIDE: 99 mmol/L (ref 97–108)
CO2: 27 mmol/L (ref 18–29)
CREATININE: 0.94 mg/dL (ref 0.76–1.27)
GFR calc Af Amer: 95 mL/min/{1.73_m2} (ref >59)
GFR calc non Af Amer: 82 mL/min/{1.73_m2} (ref >59)
Glucose: 102 mg/dL — ABNORMAL HIGH (ref 65–99)
POTASSIUM: 4.7 mmol/L (ref 3.5–5.2)
SODIUM: 139 mmol/L (ref 134–144)

## 2015-05-05 LAB — NMR, LIPOPROFILE
Cholesterol: 117 mg/dL (ref 100–199)
HDL CHOLESTEROL BY NMR: 49 mg/dL (ref >39)
HDL Particle Number: 32.8 umol/L (ref >=30.5)
LDL Particle Number: 526 nmol/L (ref <1000)
LDL SIZE: 20.6 nm (ref >20.5)
LDL-C: 56 mg/dL (ref 0–99)
LP-IR SCORE: 41 (ref <=45)
SMALL LDL PARTICLE NUMBER: 205 nmol/L (ref <=527)
Triglycerides by NMR: 61 mg/dL (ref 0–149)

## 2015-05-05 LAB — VITAMIN D 25 HYDROXY (VIT D DEFICIENCY, FRACTURES): Vit D, 25-Hydroxy: 36.2 ng/mL (ref 30.0–100.0)

## 2015-05-07 ENCOUNTER — Telehealth: Payer: Self-pay | Admitting: *Deleted

## 2015-05-07 NOTE — Telephone Encounter (Signed)
-----   Message from Chipper Herb, MD sent at 05/05/2015  7:49 PM EDT ----- The blood sugar is slightly elevated at 102. The creatinine, the most important kidney function test is within normal limits. The electrolytes including potassium are good. All liver function tests are within normal limits. All cholesterol numbers with advanced lipid testing were excellent and at goal. The total LDL particle number was the best that it has been in over 2 years at 526. The LDL C was good at 56 and triglycerides are good at 61 and the HDL particle number or the good cholesterol was also within normal limits.----- the patient should continue with his Crestor and Zetia and with as aggressive therapeutic lifestyle changes as possible which include diet and exercise Vitamin D level was stable at 36.2. This is at the low end of the normal range. Please make sure that the patient is continuing to take his vitamin D3, 5000 units, 1 daily.

## 2015-05-14 ENCOUNTER — Encounter: Payer: Self-pay | Admitting: Internal Medicine

## 2015-07-02 ENCOUNTER — Encounter: Payer: Self-pay | Admitting: Internal Medicine

## 2015-07-30 ENCOUNTER — Telehealth: Payer: Self-pay | Admitting: Family Medicine

## 2015-07-30 DIAGNOSIS — M1710 Unilateral primary osteoarthritis, unspecified knee: Secondary | ICD-10-CM

## 2015-07-30 DIAGNOSIS — IMO0002 Reserved for concepts with insufficient information to code with codable children: Secondary | ICD-10-CM

## 2015-07-30 MED ORDER — HYDROCODONE-ACETAMINOPHEN 5-325 MG PO TABS: 1.0000 | ORAL_TABLET | Freq: Four times a day (QID) | ORAL | Status: DC | PRN

## 2015-07-30 NOTE — Telephone Encounter (Signed)
Patient aware that rx is ready to be picked up.  

## 2015-07-30 NOTE — Telephone Encounter (Signed)
This is okay to refill 

## 2015-07-31 ENCOUNTER — Other Ambulatory Visit: Payer: Self-pay | Admitting: Family Medicine

## 2015-08-20 ENCOUNTER — Other Ambulatory Visit: Payer: Self-pay | Admitting: Family Medicine

## 2015-09-14 ENCOUNTER — Ambulatory Visit (INDEPENDENT_AMBULATORY_CARE_PROVIDER_SITE_OTHER): Payer: Medicare Other | Admitting: Family Medicine

## 2015-09-14 ENCOUNTER — Ambulatory Visit (INDEPENDENT_AMBULATORY_CARE_PROVIDER_SITE_OTHER): Payer: Medicare Other

## 2015-09-14 ENCOUNTER — Encounter: Payer: Self-pay | Admitting: Family Medicine

## 2015-09-14 VITALS — BP 170/95 | HR 96 | Temp 97.8°F | Ht 66.0 in | Wt 185.0 lb

## 2015-09-14 DIAGNOSIS — N400 Enlarged prostate without lower urinary tract symptoms: Secondary | ICD-10-CM

## 2015-09-14 DIAGNOSIS — E785 Hyperlipidemia, unspecified: Secondary | ICD-10-CM

## 2015-09-14 DIAGNOSIS — M25511 Pain in right shoulder: Secondary | ICD-10-CM

## 2015-09-14 DIAGNOSIS — I6529 Occlusion and stenosis of unspecified carotid artery: Secondary | ICD-10-CM | POA: Diagnosis not present

## 2015-09-14 DIAGNOSIS — M170 Bilateral primary osteoarthritis of knee: Secondary | ICD-10-CM

## 2015-09-14 DIAGNOSIS — E559 Vitamin D deficiency, unspecified: Secondary | ICD-10-CM | POA: Diagnosis not present

## 2015-09-14 DIAGNOSIS — I10 Essential (primary) hypertension: Secondary | ICD-10-CM | POA: Diagnosis not present

## 2015-09-14 DIAGNOSIS — E8881 Metabolic syndrome: Secondary | ICD-10-CM | POA: Diagnosis not present

## 2015-09-14 MED ORDER — LORAZEPAM 0.5 MG PO TABS: 0.5000 mg | ORAL_TABLET | Freq: Every day | ORAL | Status: DC | PRN

## 2015-09-14 MED ORDER — LOSARTAN POTASSIUM 50 MG PO TABS: 50.0000 mg | ORAL_TABLET | Freq: Every day | ORAL | Status: DC

## 2015-09-14 NOTE — Patient Instructions (Addendum)
Medicare Annual Wellness Visit  [facility] and the medical providers at [facility] strive to bring you the best medical care.  In doing so we not only want to address your current medical conditions and concerns but also to detect new conditions early and prevent illness, disease and health-related problems.    Medicare offers a yearly Wellness Visit which allows our clinical staff to assess your need for preventative services including immunizations, lifestyle education, counseling to decrease risk of preventable diseases and screening for fall risk and other medical concerns.    This visit is provided free of charge (no copay) for all Medicare recipients. The clinical pharmacists at [facility] have begun to conduct these Wellness Visits which will also include a thorough review of all your medications.    As you primary medical provider recommend that you make an appointment for your Annual Wellness Visit if you have not done so already this year.  You may set up this appointment before you leave today or you may call back [phone] and schedule an appointment.  Please make sure when you call that you mention that you are scheduling your Annual Wellness Visit with the clinical pharmacist so that the appointment may be made for the proper length of time.     Continue current medications. Continue good therapeutic lifestyle changes which include good diet and exercise. Fall precautions discussed with patient. If an FOBT was given today- please return it to our front desk. If you are over 83 years old - you may need Prevnar 5 or the adult Pneumonia vaccine.  **Flu shots are available--- please call and schedule a FLU-CLINIC appointment**  After your visit with [place] today you will receive a survey in the mail or online from [employer] regarding your care with [place]. Please take a moment to fill this out. Your feedback is very  important to [place] as you can help [place] better understand your patient needs as well as improve your experience and satisfaction. WE CARE ABOUT YOU!!!   The patient should use warm wet compresses to the right shoulder and try to avoid heavy lifting pushing pulling etc. We will call him with the results of the x-ray of the shoulders and as those results become available He should take the blood pressure medicine as directed and return to the clinic in 2 weeks for recheck of his blood pressure by the nurse and a BMP He should continue to watch his sodium intake Bring blood pressure readings to the office in you come in 2 weeks If you continue to have problems with the shoulder we may need to get an MRI or a repeat visit with orthopedic surgeon who did the original surgery for the rotator cuff Please call and schedule your colonoscopy with Dr. Carlean Purl and also tell him at the same time you're having trouble with swallowing at times and we've your food going down like it did in the past and he may need to do an endoscopy also we also discussed with the patient that in the future we may only be able to give a pain peel or a nerve pill but not both and we're preparing him for this change in treatment. Please schedule eye exam

## 2015-09-14 NOTE — Progress Notes (Signed)
Subjective:    Patient ID: Joshua Bowman, male    DOB: June 25, 1946, 69 y.o.   MRN: [id number]  HPI Pt here for follow up and management of chronic medical problems which includes hypertension and hyperlipidemia. He is taking medications regularly. The patient has a long-standing history of arthritis especially in his knees. He is complaining of right shoulder pain and he has surgery on the shoulder about 10 years ago. He is requesting a refill on his lorazepam. He is past due to his colonoscopy and he will schedule a visit with his gastroenteritis for his colonoscopy. He will get lab work done today. The patient brings in home blood pressure readings for review and all of these are good. His machine registers high the day at 148/94. OUR machine on repeat was 170/95. The patient denies chest pain or shortness of breath. He does have some trouble swallowing and food wants to hang up and his swallowing tube at times. He had problems with this several years ago. He had a have an endoscopy and dilatation at that time. He is in need to get a colonoscopy and he plans to call the gastroenterologist to arrange this. Also told him to mention about the swallowing issues and he may need to get an endoscopy at the same time. His heartburn is there off and on. He does deny any problems with blood in the stool or black tarry bowel movements and says his bowel movements are regular and normal leaflet warm. He is not having trouble passing his water. He does have a right shoulder pain and is especially bad when he reaches to the left. He has a previous rotator cuff repair on the side several years ago. His eye exams are not up-to-date either and he needs to arrange to get this done.    Patient Active Problem List   Diagnosis Date Noted  . Coronary artery calcification 02/07/2015  . BPH (benign prostatic hyperplasia) 12/06/2013  . Metabolic syndrome 78/24/2353  . Carotid artery stenosis 12/06/2013  . Abnormal chest  CT 08/17/2013  . Hypertension 03/30/2013  . Degenerative arthritis 03/30/2013  . Hyperlipidemia 03/30/2013  . Personal history of adenomatous colonic polyps 05/06/2012   Outpatient Encounter Prescriptions as of 09/14/2015  Medication Sig  . albuterol (PROAIR HFA) 108 (90 BASE) MCG/ACT inhaler INHALE ONE OR TWO PUFFS BY MOUTH FOUR TIMES DAILY AS NEEDED  . aspirin 325 MG tablet Take 325 mg by mouth daily.  . Cholecalciferol (VITAMIN D3) 5000 UNITS CAPS Take 1 tablet by mouth. 2-3 x week  . CRESTOR 40 MG tablet TAKE ONE TABLET BY MOUTH EVERY DAY AS DIRECTED  . fluticasone (FLONASE) 50 MCG/ACT nasal spray Place 2 sprays into both nostrils daily.  Marland Kitchen HYDROcodone-acetaminophen (NORCO/VICODIN) 5-325 MG tablet Take 1 tablet by mouth every 6 (six) hours as needed.  Marland Kitchen LORazepam (ATIVAN) 0.5 MG tablet Take 1 tablet (0.5 mg total) by mouth daily as needed.  . SYMBICORT 160-4.5 MCG/ACT inhaler INHALE 2 PUFFS BY MOUTH INTO  THE LUNGS TWICE A DAY (RINSE MOUTH AFTER EACH USE)  . ZETIA 10 MG tablet TAKE ONE TABLET BY MOUTH ONE TIME DAILY   No facility-administered encounter medications on file as of 09/14/2015.      Review of Systems  Constitutional: Negative.   HENT: Negative.   Eyes: Negative.   Respiratory: Negative.   Cardiovascular: Negative.   Gastrointestinal: Negative.   Endocrine: Negative.   Genitourinary: Negative.   Musculoskeletal: Positive for arthralgias (right shoulder pains).  Skin: Negative.   Allergic/Immunologic: Negative.   Neurological: Negative.   Hematological: Negative.   Psychiatric/Behavioral: Negative.        Objective:   Physical Exam  Constitutional: He is oriented to person, place, and time. He appears well-developed and well-nourished. No distress.  HENT:  Head: Normocephalic and atraumatic.  Right Ear: External ear normal.  Left Ear: External ear normal.  Mouth/Throat: Oropharynx is clear and moist. No oropharyngeal exudate.  Nasal congestion bilaterally   Eyes: Conjunctivae and EOM are normal. Pupils are equal, round, and reactive to light. Right eye exhibits no discharge. Left eye exhibits no discharge. No scleral icterus.  Neck: Normal range of motion. Neck supple. No thyromegaly present.  No bruits were audible today and there is no thyromegaly or anterior cervical adenopathy  Cardiovascular: Normal rate, regular rhythm, normal heart sounds and intact distal pulses.   No murmur heard. Heart has a regular rate and rhythm at 84/m  Pulmonary/Chest: Effort normal and breath sounds normal. No respiratory distress. He has no wheezes. He has no rales. He exhibits no tenderness.  Clear anteriorly and posteriorly  Abdominal: Soft. Bowel sounds are normal. He exhibits no mass. There is no tenderness. There is no rebound and no guarding.  There is no abdominal tenderness masses or organ enlargement  Musculoskeletal: He exhibits no edema or tenderness.  There was generalized tenderness in the right shoulder that noted specific point tenderness. He continues to have the arthritic changes in both knees.  Lymphadenopathy:    He has no cervical adenopathy.  Neurological: He is alert and oriented to person, place, and time. He has normal reflexes. No cranial nerve deficit.  Skin: Skin is warm and dry. No rash noted.  Psychiatric: He has a normal mood and affect. His behavior is normal. Judgment and thought content normal.  Nursing note and vitals reviewed.  BP 176/92 mmHg  Pulse 108  Temp(Src) 97.8 F (36.6 C) (Oral)  Ht '5\' 6"'$  (1.676 m)  Wt 185 lb (83.915 kg)  BMI 29.87 kg/m2  Repeat blood pressure by me was 160/92 and the left arm sitting  [facility] reading (PRIMARY) by  Dr. Governor Rooks shoulder-  degenerative changes and previous rotator cuff surgery apparent                                    Assessment & Plan:  1. Vitamin D deficiency -Continue current treatment pending results of lab work - CBC with Differential/Platelet - VITAMIN D 25  Hydroxy (Vit-D Deficiency, Fractures)  2. Hyperlipemia -Continue current treatment pending results of lab work - CBC with Differential/Platelet - NMR, lipoprofile  3. Essential hypertension -The blood pressure remains elevated and despite the home readings being better we will start the patient on low [place] 50 one daily and we'll ask him to come by in a couple weeks for a repeat blood pressure and BMP by the nurse. - BMP8+EGFR - CBC with Differential/Platelet - Hepatic function panel  4. Metabolic syndrome -Continue to work on weight loss with diet and exercise - BMP8+EGFR - CBC with Differential/Platelet  5. BPH (benign prostatic hyperplasia) -He is having no problems passing his water. - CBC with Differential/Platelet  6. Right shoulder pain -The patient had a rotator cuff repair several years ago and has recently developed more problems and pain with the right shoulder. We gave him a shot of Depo-Medrol today and hopefully this will relieve some of the  discomfort and we will also get an x-ray. If problems continue he may need to go back to see Dr. Delrae Sawyers for further evaluation - DG Shoulder Right; Future  7. Primary osteoarthritis of both knees -He says he is still not ready to have knee replacement and we will continue to follow this with him.  8. Carotid artery stenosis, unspecified laterality -No bruits were audible today.  Meds ordered this encounter  Medications  . LORazepam (ATIVAN) 0.5 MG tablet    Sig: Take 1 tablet (0.5 mg total) by mouth daily as needed.    Dispense:  30 tablet    Refill:  3  . losartan (COZAAR) 50 MG tablet    Sig: Take 1 tablet (50 mg total) by mouth daily.    Dispense:  30 tablet    Refill:  6   Patient Instructions                       Medicare Annual Wellness Visit  [place] and the medical providers at [facility] strive to bring you the best medical care.  In doing so we not only want to address your current  medical conditions and concerns but also to detect new conditions early and prevent illness, disease and health-related problems.    Medicare offers a yearly Wellness Visit which allows our clinical staff to assess your need for preventative services including immunizations, lifestyle education, counseling to decrease risk of preventable diseases and screening for fall risk and other medical concerns.    This visit is provided free of charge (no copay) for all Medicare recipients. The clinical pharmacists at [facility] have begun to conduct these Wellness Visits which will also include a thorough review of all your medications.    As you primary medical provider recommend that you make an appointment for your Annual Wellness Visit if you have not done so already this year.  You may set up this appointment before you leave today or you may call back ([phone]) and schedule an appointment.  Please make sure when you call that you mention that you are scheduling your Annual Wellness Visit with the clinical pharmacist so that the appointment may be made for the proper length of time.     Continue current medications. Continue good therapeutic lifestyle changes which include good diet and exercise. Fall precautions discussed with patient. If an FOBT was given today- please return it to our front desk. If you are over 43 years old - you may need Prevnar 73 or the adult Pneumonia vaccine.  **Flu shots are available--- please call and schedule a FLU-CLINIC appointment**  After your visit with [place] today you will receive a survey in the mail or online from [employer] regarding your care with [place]. Please take a moment to fill this out. Your feedback is very important to [place] as you can help [place] better understand your patient needs as well as improve your experience and satisfaction. WE CARE ABOUT YOU!!!   The patient should use warm wet compresses to the right shoulder and try to avoid  heavy lifting pushing pulling etc. We will call him with the results of the x-ray of the shoulders and as those results become available He should take the blood pressure medicine as directed and return to the clinic in 2 weeks for recheck of his blood pressure by the nurse and a BMP He should continue to watch his sodium intake Bring blood  pressure readings to the office in you come in 2 weeks If you continue to have problems with the shoulder we may need to get an MRI or a repeat visit with orthopedic surgeon who did the original surgery for the rotator cuff Please call and schedule your colonoscopy with Dr. Carlean Purl and also tell him at the same time you're having trouble with swallowing at times and we've your food going down like it did in the past and he may need to do an endoscopy also we also discussed with the patient that in the future we may only be able to give a pain peel or a nerve pill but not both and we're preparing him for this change in treatment. Please schedule eye exam   Arrie Senate MD

## 2015-09-16 LAB — BMP8+EGFR
BUN/Creatinine Ratio: 14 (ref 10–22)
BUN: 13 mg/dL (ref 8–27)
CO2: 22 mmol/L (ref 18–29)
CREATININE: 0.93 mg/dL (ref 0.76–1.27)
Calcium: 9.9 mg/dL (ref 8.6–10.2)
Chloride: 100 mmol/L (ref 96–106)
GFR, EST AFRICAN AMERICAN: 96 mL/min/{1.73_m2} (ref >59)
GFR, EST NON AFRICAN AMERICAN: 83 mL/min/{1.73_m2} (ref >59)
Glucose: 114 mg/dL — ABNORMAL HIGH (ref 65–99)
POTASSIUM: 4.3 mmol/L (ref 3.5–5.2)
Sodium: 138 mmol/L (ref 134–144)

## 2015-09-16 LAB — HEPATIC FUNCTION PANEL
ALBUMIN: 4.3 g/dL (ref 3.6–4.8)
ALK PHOS: 50 IU/L (ref 39–117)
ALT: 11 IU/L (ref 0–44)
AST: 14 IU/L (ref 0–40)
BILIRUBIN, DIRECT: 0.12 mg/dL (ref 0.00–0.40)
Bilirubin Total: 0.4 mg/dL (ref 0.0–1.2)
TOTAL PROTEIN: 6.4 g/dL (ref 6.0–8.5)

## 2015-09-16 LAB — CBC WITH DIFFERENTIAL/PLATELET
BASOS ABS: 0 10*3/uL (ref 0.0–0.2)
Basos: 1 %
EOS (ABSOLUTE): 0 10*3/uL (ref 0.0–0.4)
EOS: 0 %
HEMATOCRIT: 48.9 % (ref 37.5–51.0)
HEMOGLOBIN: 17.4 g/dL (ref 12.6–17.7)
Immature Grans (Abs): 0 10*3/uL (ref 0.0–0.1)
Immature Granulocytes: 0 %
LYMPHS ABS: 1.7 10*3/uL (ref 0.7–3.1)
Lymphs: 22 %
MCH: 32 pg (ref 26.6–33.0)
MCHC: 35.6 g/dL (ref 31.5–35.7)
MCV: 90 fL (ref 79–97)
MONOCYTES: 8 %
MONOS ABS: 0.6 10*3/uL (ref 0.1–0.9)
NEUTROS ABS: 5.5 10*3/uL (ref 1.4–7.0)
Neutrophils: 69 %
Platelets: 232 10*3/uL (ref 150–379)
RBC: 5.44 x10E6/uL (ref 4.14–5.80)
RDW: 13.7 % (ref 12.3–15.4)
WBC: 7.8 10*3/uL (ref 3.4–10.8)

## 2015-09-16 LAB — NMR, LIPOPROFILE
CHOLESTEROL: 131 mg/dL (ref 100–199)
HDL Cholesterol by NMR: 52 mg/dL (ref >39)
HDL PARTICLE NUMBER: 33.9 umol/L (ref >=30.5)
LDL Particle Number: 810 nmol/L (ref <1000)
LDL SIZE: 20.3 nm (ref >20.5)
LDL-C: 66 mg/dL (ref 0–99)
SMALL LDL PARTICLE NUMBER: 406 nmol/L (ref <=527)
Triglycerides by NMR: 65 mg/dL (ref 0–149)

## 2015-09-16 LAB — VITAMIN D 25 HYDROXY (VIT D DEFICIENCY, FRACTURES): Vit D, 25-Hydroxy: 30.9 ng/mL (ref 30.0–100.0)

## 2015-09-17 ENCOUNTER — Telehealth: Payer: Self-pay | Admitting: *Deleted

## 2015-09-17 NOTE — Telephone Encounter (Signed)
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

## 2015-09-17 NOTE — Telephone Encounter (Signed)
Yes, it is still okay to take this medicine. If we don't get the blood pressure down your kidneys will be damage further.

## 2015-09-18 NOTE — Telephone Encounter (Signed)
Patient aware and verbalizes understanding. 

## 2015-09-28 ENCOUNTER — Ambulatory Visit (INDEPENDENT_AMBULATORY_CARE_PROVIDER_SITE_OTHER): Payer: Medicare Other

## 2015-09-28 VITALS — BP 128/87 | HR 110

## 2015-09-28 DIAGNOSIS — I10 Essential (primary) hypertension: Secondary | ICD-10-CM

## 2015-09-28 NOTE — Progress Notes (Signed)
Patient ID: Joshua Bowman, male   DOB: [birth year], 69 y.o.   MRN: [id number]   Patient presents to office today for a recheck of BP and labwork per DW[facility]. He has brought a log from home of BPs that were all within normal limits. Today his BP is 128/87. Advised him to continue to monitor BPs at home and we would call with lab results. Patient verbalized understanding

## 2015-09-29 LAB — BMP8+EGFR
BUN/Creatinine Ratio: 13 (ref 10–22)
BUN: 13 mg/dL (ref 8–27)
CO2: 22 mmol/L (ref 18–29)
Calcium: 9.5 mg/dL (ref 8.6–10.2)
Chloride: 101 mmol/L (ref 96–106)
Creatinine, Ser: 1.01 mg/dL (ref 0.76–1.27)
GFR calc Af Amer: 87 mL/min/{1.73_m2} (ref >59)
GFR calc non Af Amer: 76 mL/min/{1.73_m2} (ref >59)
GLUCOSE: 124 mg/dL — AB (ref 65–99)
POTASSIUM: 4.2 mmol/L (ref 3.5–5.2)
SODIUM: 138 mmol/L (ref 134–144)

## 2015-10-10 ENCOUNTER — Telehealth: Payer: Self-pay | Admitting: *Deleted

## 2015-10-10 MED ORDER — ROSUVASTATIN CALCIUM 20 MG PO TABS: 20.0000 mg | ORAL_TABLET | Freq: Every day | ORAL | Status: DC

## 2015-10-10 MED ORDER — EZETIMIBE 10 MG PO TABS: 5.0000 mg | ORAL_TABLET | Freq: Every day | ORAL | Status: DC

## 2015-10-10 NOTE — Telephone Encounter (Signed)
Patient was on [facility] list for non adherence to cholesterol medication.  Spoke with patient and he is pill splitting Zetia and Crestor.  Explained why that can't be done anymore. Also explained that Crestor is generic and should be cheaper than what he was accustomed to paying for namebrand. Also suggested he contact the customer service number on the back of his card to talk about his prescription coverage. Some generics are free through [employer].  New prescriptions sent to pharmacy. Patient will notify us if he has any problems.

## 2015-10-31 ENCOUNTER — Telehealth: Payer: Self-pay | Admitting: Family Medicine

## 2015-10-31 DIAGNOSIS — IMO0002 Reserved for concepts with insufficient information to code with codable children: Secondary | ICD-10-CM

## 2015-10-31 DIAGNOSIS — M1710 Unilateral primary osteoarthritis, unspecified knee: Secondary | ICD-10-CM

## 2015-10-31 MED ORDER — HYDROCODONE-ACETAMINOPHEN 5-325 MG PO TABS: 1.0000 | ORAL_TABLET | Freq: Four times a day (QID) | ORAL | Status: DC | PRN

## 2015-10-31 NOTE — Telephone Encounter (Signed)
Last filled 07/30/15, last seen 09/14/15. Rx will print

## 2015-10-31 NOTE — Telephone Encounter (Signed)
Printed and dwm to sign

## 2016-02-01 ENCOUNTER — Ambulatory Visit (INDEPENDENT_AMBULATORY_CARE_PROVIDER_SITE_OTHER): Payer: Medicare Other | Admitting: Family Medicine

## 2016-02-01 ENCOUNTER — Encounter: Payer: Self-pay | Admitting: Family Medicine

## 2016-02-01 VITALS — BP 181/85 | HR 111 | Temp 98.1°F | Ht 66.0 in | Wt 179.0 lb

## 2016-02-01 DIAGNOSIS — I10 Essential (primary) hypertension: Secondary | ICD-10-CM

## 2016-02-01 DIAGNOSIS — IMO0002 Reserved for concepts with insufficient information to code with codable children: Secondary | ICD-10-CM

## 2016-02-01 DIAGNOSIS — M150 Primary generalized (osteo)arthritis: Secondary | ICD-10-CM

## 2016-02-01 DIAGNOSIS — E785 Hyperlipidemia, unspecified: Secondary | ICD-10-CM | POA: Diagnosis not present

## 2016-02-01 DIAGNOSIS — M159 Polyosteoarthritis, unspecified: Secondary | ICD-10-CM

## 2016-02-01 DIAGNOSIS — N400 Enlarged prostate without lower urinary tract symptoms: Secondary | ICD-10-CM

## 2016-02-01 DIAGNOSIS — E559 Vitamin D deficiency, unspecified: Secondary | ICD-10-CM | POA: Diagnosis not present

## 2016-02-01 DIAGNOSIS — Z0000 Encounter for general adult medical examination without abnormal findings: Secondary | ICD-10-CM | POA: Diagnosis not present

## 2016-02-01 DIAGNOSIS — M1710 Unilateral primary osteoarthritis, unspecified knee: Secondary | ICD-10-CM

## 2016-02-01 DIAGNOSIS — Z1211 Encounter for screening for malignant neoplasm of colon: Secondary | ICD-10-CM

## 2016-02-01 DIAGNOSIS — I6529 Occlusion and stenosis of unspecified carotid artery: Secondary | ICD-10-CM

## 2016-02-01 DIAGNOSIS — R9389 Abnormal findings on diagnostic imaging of other specified body structures: Secondary | ICD-10-CM

## 2016-02-01 LAB — URINALYSIS, COMPLETE
BILIRUBIN UA: NEGATIVE
GLUCOSE, UA: NEGATIVE
NITRITE UA: NEGATIVE
Protein, UA: NEGATIVE
SPEC GRAV UA: 1.015 (ref 1.005–1.030)
UUROB: 0.2 mg/dL (ref 0.2–1.0)
pH, UA: 6 (ref 5.0–7.5)

## 2016-02-01 LAB — MICROSCOPIC EXAMINATION
BACTERIA UA: NONE SEEN
EPITHELIAL CELLS (NON RENAL): NONE SEEN /HPF (ref 0–10)

## 2016-02-01 MED ORDER — HYDROCODONE-ACETAMINOPHEN 5-325 MG PO TABS: 1.0000 | ORAL_TABLET | Freq: Four times a day (QID) | ORAL | Status: DC | PRN

## 2016-02-01 MED ORDER — FLUTICASONE PROPIONATE 50 MCG/ACT NA SUSP: 2.0000 | Freq: Every day | NASAL | Status: DC

## 2016-02-01 MED ORDER — LORAZEPAM 0.5 MG PO TABS: 0.5000 mg | ORAL_TABLET | Freq: Every day | ORAL | Status: DC | PRN

## 2016-02-01 MED ORDER — ESCITALOPRAM OXALATE 10 MG PO TABS: 10.0000 mg | ORAL_TABLET | Freq: Every day | ORAL | Status: DC

## 2016-02-01 NOTE — Progress Notes (Signed)
Subjective:    Patient ID: Joshua Bowman, male    DOB: [birth year], 70 y.o.   MRN: [id number]  HPI Patient is here today for annual wellness exam and follow up of chronic medical problems which includes hypertension and hyperlipidemia. He is taking medications regularly.The biggest complaint for this patient continues to be his back pain and bilateral knee pain. He is been seen by the orthopedic surgeon and is waiting until he gets bad enough for him to have surgery. It is important to note that the patient recently lost his father who was in his 45s. He dialyzes result of complications of pneumonia and older age. He continues to deal with stress and anxiety and as mentioned his ongoing knee pain. His last colonoscopy was in August 2013 and he has had adenomatous polyps in the past and we'll most likely be due to have another colonoscopy in August 2018. The patient denies any chest pain. He does have some shortness of breath and attributes this to his COPD and cigarette smoking. He has had several CT scans and we're not sure if he is needing any more for follow-up. We will check on this before he leaves office today. He denies any trouble swallowing and denies blood in the stool black tarry bowel movements or abdominal pain. He does have occasional heartburn which is secondary to his diet and he uses a release this by taking Tums. He is passing his water okay but does have nocturia 1-2 times at night time. He has no burning. His biggest complaints once again involve his knees and his low back. It is not controlling his life. He does say today that he is had more anxiousness and depression and he attributes some of this to having lost his father recently and just not recovering well from that. He currently takes arouse Pam as needed. We discussed the use of Lexapro and we may try this to see if this can get him some added help with these feelings.     Patient Active Problem List   Diagnosis Date Noted    . Coronary artery calcification 02/07/2015  . BPH (benign prostatic hyperplasia) 12/06/2013  . Metabolic syndrome 02/63/7858  . Carotid artery stenosis 12/06/2013  . Abnormal chest CT 08/17/2013  . Hypertension 03/30/2013  . Degenerative arthritis 03/30/2013  . Hyperlipidemia 03/30/2013  . Personal history of adenomatous colonic polyps 05/06/2012   Outpatient Encounter Prescriptions as of 02/01/2016  Medication Sig  . albuterol (PROAIR HFA) 108 (90 BASE) MCG/ACT inhaler INHALE ONE OR TWO PUFFS BY MOUTH FOUR TIMES DAILY AS NEEDED  . aspirin 325 MG tablet Take 325 mg by mouth daily.  . Cholecalciferol (VITAMIN D3) 5000 UNITS CAPS Take 1 tablet by mouth. 2-3 x week  . ezetimibe (ZETIA) 10 MG tablet Take 0.5 tablets (5 mg total) by mouth daily.  Marland Kitchen HYDROcodone-acetaminophen (NORCO/VICODIN) 5-325 MG tablet Take 1 tablet by mouth every 6 (six) hours as needed.  Marland Kitchen LORazepam (ATIVAN) 0.5 MG tablet Take 1 tablet (0.5 mg total) by mouth daily as needed.  Marland Kitchen losartan (COZAAR) 50 MG tablet Take 1 tablet (50 mg total) by mouth daily.  . rosuvastatin (CRESTOR) 20 MG tablet Take 1 tablet (20 mg total) by mouth daily.  . SYMBICORT 160-4.5 MCG/ACT inhaler INHALE 2 PUFFS BY MOUTH INTO  THE LUNGS TWICE A DAY (RINSE MOUTH AFTER EACH USE)  . [DISCONTINUED] HYDROcodone-acetaminophen (NORCO/VICODIN) 5-325 MG tablet Take 1 tablet by mouth every 6 (six) hours as needed.  . [DISCONTINUED]  LORazepam (ATIVAN) 0.5 MG tablet Take 1 tablet (0.5 mg total) by mouth daily as needed.  . fluticasone (FLONASE) 50 MCG/ACT nasal spray Place 2 sprays into both nostrils daily.  . [DISCONTINUED] fluticasone (FLONASE) 50 MCG/ACT nasal spray Place 2 sprays into both nostrils daily. (Patient not taking: Reported on 02/01/2016)   No facility-administered encounter medications on file as of 02/01/2016.      Review of Systems  Constitutional: Negative.   HENT: Negative.   Eyes: Negative.   Respiratory: Negative.   Cardiovascular:  Negative.   Gastrointestinal: Negative.   Endocrine: Negative.   Genitourinary: Negative.   Musculoskeletal: Positive for back pain and arthralgias (bilateral knee pain L>R).  Skin: Negative.   Allergic/Immunologic: Negative.   Neurological: Negative.   Hematological: Negative.   Psychiatric/Behavioral: Negative.        Objective:   Physical Exam  Constitutional: He is oriented to person, place, and time. He appears well-developed and well-nourished. No distress.  HENT:  Head: Normocephalic and atraumatic.  Right Ear: External ear normal.  Left Ear: External ear normal.  Mouth/Throat: Oropharynx is clear and moist. No oropharyngeal exudate.  Nasal congestion and turbinate swelling bilaterally  Eyes: Conjunctivae and EOM are normal. Pupils are equal, round, and reactive to light. Right eye exhibits no discharge. Left eye exhibits no discharge. No scleral icterus.  Neck: Normal range of motion. Neck supple. No thyromegaly present.  Cardiovascular: Normal rate, regular rhythm, normal heart sounds and intact distal pulses.   No murmur heard. The heart had a regular rate and rhythm at 72/m  Pulmonary/Chest: Effort normal and breath sounds normal. No respiratory distress. He has no wheezes. He has no rales. He exhibits no tenderness.  Some congestion with coughing but good breath sounds anteriorly and posteriorly  Abdominal: Soft. Bowel sounds are normal. He exhibits no mass. There is no tenderness. There is no rebound and no guarding.  Genitourinary: Rectum normal and penis normal.  The prostate is enlarged bilaterally but no nodules or hard areas were palpable. There were no rectal masses. The external genitalia were within normal limits and there were no inguinal hernias palpable.  Musculoskeletal: Normal range of motion. He exhibits no edema.  Lymphadenopathy:    He has no cervical adenopathy.  Neurological: He is alert and oriented to person, place, and time. He has normal reflexes.  No cranial nerve deficit.  Skin: Skin is warm and dry. No rash noted.  Psychiatric: He has a normal mood and affect. His behavior is normal. Judgment and thought content normal.  Nursing note and vitals reviewed.  BP 181/85 mmHg  Pulse 111  Temp(Src) 98.1 F (36.7 C) (Oral)  Ht '5\' 6"'$  (1.676 m)  Wt 179 lb (81.194 kg)  BMI 28.91 kg/m2        Assessment & Plan:  1. Annual physical exam -The patient was reminded that he will need a colonoscopy next August and 2018. - CBC with Differential/Platelet - BMP8+EGFR - Hepatic function panel - Fecal occult blood, imunochemical; Future - NMR, lipoprofile - VITAMIN D 25 Hydroxy (Vit-D Deficiency, Fractures) - PSA, total and free - Urinalysis, Complete  2. Essential hypertension -He should continue to monitor blood pressures at home and watch his sodium intake. A repeat blood pressure today got 142/98 and the right arm with a large cuff. His readings from home have been good and they are well documented. He asked he brought his cuff here and we got a similar reading here in this office with his cuff from  home. - CBC with Differential/Platelet - BMP8+EGFR - Hepatic function panel  3. Vitamin D deficiency -Continue current treatment pending results of lab work - CBC with Differential/Platelet - VITAMIN D 25 Hydroxy (Vit-D Deficiency, Fractures)  4. Hyperlipemia -Continue current treatment and aggressive therapeutic lifestyle changes pending results of lab work - CBC with Differential/Platelet - NMR, lipoprofile  5. BPH (benign prostatic hyperplasia) -We will continue to monitor the prostate and will be getting a PSA today. - CBC with Differential/Platelet - PSA, total and free - Urinalysis, Complete  6. Special screening for malignant neoplasms, colon -Return the FOBT card - CBC with Differential/Platelet - Fecal occult blood, imunochemical; Future  7. Osteoarthrosis, unspecified whether generalized or localized, involving  lower leg -Continue take the pain medicine as needed and avoid irritating stresses and heavy moving of objects. - HYDROcodone-acetaminophen (NORCO/VICODIN) 5-325 MG tablet; Take 1 tablet by mouth every 6 (six) hours as needed.  Dispense: 120 tablet; Refill: 0  8. Carotid artery stenosis, unspecified laterality -No bruits were audible today.  9. Abnormal chest CT -On further investigation of this last CT scan of the chest because of cigarette smoking in February 2016 no further CT scans were recommended.  10. Primary osteoarthritis involving multiple joints -Continue to take pain medicine as needed and wait until the pain gets severe enough to require knee replacement  Meds ordered this encounter  Medications  . HYDROcodone-acetaminophen (NORCO/VICODIN) 5-325 MG tablet    Sig: Take 1 tablet by mouth every 6 (six) hours as needed.    Dispense:  120 tablet    Refill:  0  . LORazepam (ATIVAN) 0.5 MG tablet    Sig: Take 1 tablet (0.5 mg total) by mouth daily as needed.    Dispense:  30 tablet    Refill:  3  . fluticasone (FLONASE) 50 MCG/ACT nasal spray    Sig: Place 2 sprays into both nostrils daily.    Dispense:  16 g    Refill:  11  . escitalopram (LEXAPRO) 10 MG tablet    Sig: Take 1 tablet (10 mg total) by mouth daily.    Dispense:  30 tablet    Refill:  5   Patient Instructions                       Medicare Annual Wellness Visit  [facility] and the medical providers at [facility] strive to bring you the best medical care.  In doing so we not only want to address your current medical conditions and concerns but also to detect new conditions early and prevent illness, disease and health-related problems.    Medicare offers a yearly Wellness Visit which allows our clinical staff to assess your need for preventative services including immunizations, lifestyle education, counseling to decrease risk of preventable diseases and screening for fall risk and  other medical concerns.    This visit is provided free of charge (no copay) for all Medicare recipients. The clinical pharmacists at [facility] have begun to conduct these Wellness Visits which will also include a thorough review of all your medications.    As you primary medical provider recommend that you make an appointment for your Annual Wellness Visit if you have not done so already this year.  You may set up this appointment before you leave today or you may call back [phone]) and schedule an appointment.  Please make sure when you call that you mention that you are scheduling  your Annual Wellness Visit with the clinical pharmacist so that the appointment may be made for the proper length of time.     Continue current medications. Continue good therapeutic lifestyle changes which include good diet and exercise. Fall precautions discussed with patient. If an FOBT was given today- please return it to our front desk. If you are over 67 years old - you may need Prevnar 39 or the adult Pneumonia vaccine.  **Flu shots are available--- please call and schedule a FLU-CLINIC appointment**  After your visit with [place] today you will receive a survey in the mail or online from [employer] regarding your care with [place]. Please take a moment to fill this out. Your feedback is very important to [place] as you can help [place] better understand your patient needs as well as improve your experience and satisfaction. WE CARE ABOUT YOU!!!   Take Zantac OTC 150 mg - equate brand--- can be taken twice daily or prior to eating a meal that you know may upset your stomach Start with the Lexapro at nighttime and give [place] a call in 2-3 weeks and let us know if you think it is helping some with some of the anxiety and feelings that you have been having recently You can continue to take the lorazepam if needed According to the last CT scan in February 2016 no further imaging is necessary Continue to  monitor blood pressures at home and watch sodium intake Please schedule yourself for a good eye exam   Arrie Senate MD

## 2016-02-01 NOTE — Patient Instructions (Addendum)
Medicare Annual Wellness Visit  [facility] and the medical providers at [facility] strive to bring you the best medical care.  In doing so we not only want to address your current medical conditions and concerns but also to detect new conditions early and prevent illness, disease and health-related problems.    Medicare offers a yearly Wellness Visit which allows our clinical staff to assess your need for preventative services including immunizations, lifestyle education, counseling to decrease risk of preventable diseases and screening for fall risk and other medical concerns.    This visit is provided free of charge (no copay) for all Medicare recipients. The clinical pharmacists at [facility] have begun to conduct these Wellness Visits which will also include a thorough review of all your medications.    As you primary medical provider recommend that you make an appointment for your Annual Wellness Visit if you have not done so already this year.  You may set up this appointment before you leave today or you may call back [phone] and schedule an appointment.  Please make sure when you call that you mention that you are scheduling your Annual Wellness Visit with the clinical pharmacist so that the appointment may be made for the proper length of time.     Continue current medications. Continue good therapeutic lifestyle changes which include good diet and exercise. Fall precautions discussed with patient. If an FOBT was given today- please return it to our front desk. If you are over 59 years old - you may need Prevnar 8 or the adult Pneumonia vaccine.  **Flu shots are available--- please call and schedule a FLU-CLINIC appointment**  After your visit with [place] today you will receive a survey in the mail or online from [employer] regarding your care with [place]. Please take a moment to fill this out. Your feedback is very  important to [place] as you can help [place] better understand your patient needs as well as improve your experience and satisfaction. WE CARE ABOUT YOU!!!   Take Zantac OTC 150 mg - equate brand--- can be taken twice daily or prior to eating a meal that you know may upset your stomach Start with the Lexapro at nighttime and give [place] a call in 2-3 weeks and let us know if you think it is helping some with some of the anxiety and feelings that you have been having recently You can continue to take the lorazepam if needed According to the last CT scan in February 2016 no further imaging is necessary Continue to monitor blood pressures at home and watch sodium intake Please schedule yourself for a good eye exam

## 2016-02-02 LAB — BMP8+EGFR
BUN/Creatinine Ratio: 14 (ref 10–24)
BUN: 13 mg/dL (ref 8–27)
CHLORIDE: 99 mmol/L (ref 96–106)
CO2: 19 mmol/L (ref 18–29)
CREATININE: 0.94 mg/dL (ref 0.76–1.27)
Calcium: 9.8 mg/dL (ref 8.6–10.2)
GFR calc Af Amer: 95 mL/min/{1.73_m2} (ref >59)
GFR calc non Af Amer: 82 mL/min/{1.73_m2} (ref >59)
GLUCOSE: 104 mg/dL — AB (ref 65–99)
POTASSIUM: 4.4 mmol/L (ref 3.5–5.2)
SODIUM: 142 mmol/L (ref 134–144)

## 2016-02-02 LAB — HEPATIC FUNCTION PANEL
ALBUMIN: 4.5 g/dL (ref 3.5–4.8)
ALK PHOS: 54 IU/L (ref 39–117)
ALT: 9 IU/L (ref 0–44)
AST: 14 IU/L (ref 0–40)
Bilirubin Total: 0.5 mg/dL (ref 0.0–1.2)
Bilirubin, Direct: 0.16 mg/dL (ref 0.00–0.40)
TOTAL PROTEIN: 6.8 g/dL (ref 6.0–8.5)

## 2016-02-02 LAB — PSA, TOTAL AND FREE
PSA FREE PCT: 16.3 %
PSA, Free: 0.49 ng/mL
Prostate Specific Ag, Serum: 3 ng/mL (ref 0.0–4.0)

## 2016-02-02 LAB — CBC WITH DIFFERENTIAL/PLATELET
BASOS ABS: 0.1 10*3/uL (ref 0.0–0.2)
Basos: 1 %
EOS (ABSOLUTE): 0 10*3/uL (ref 0.0–0.4)
Eos: 0 %
HEMOGLOBIN: 17.5 g/dL (ref 12.6–17.7)
Hematocrit: 51.8 % — ABNORMAL HIGH (ref 37.5–51.0)
Immature Grans (Abs): 0 10*3/uL (ref 0.0–0.1)
Immature Granulocytes: 0 %
LYMPHS ABS: 2.2 10*3/uL (ref 0.7–3.1)
LYMPHS: 22 %
MCH: 31.5 pg (ref 26.6–33.0)
MCHC: 33.8 g/dL (ref 31.5–35.7)
MCV: 93 fL (ref 79–97)
MONOCYTES: 7 %
Monocytes Absolute: 0.7 10*3/uL (ref 0.1–0.9)
Neutrophils Absolute: 7 10*3/uL (ref 1.4–7.0)
Neutrophils: 70 %
Platelets: 273 10*3/uL (ref 150–379)
RBC: 5.56 x10E6/uL (ref 4.14–5.80)
RDW: 13.7 % (ref 12.3–15.4)
WBC: 9.9 10*3/uL (ref 3.4–10.8)

## 2016-02-02 LAB — NMR, LIPOPROFILE
CHOLESTEROL: 130 mg/dL (ref 100–199)
HDL CHOLESTEROL BY NMR: 59 mg/dL (ref >39)
HDL PARTICLE NUMBER: 37.6 umol/L (ref >=30.5)
LDL Particle Number: 452 nmol/L (ref <1000)
LDL Size: 20.4 nm (ref >20.5)
LDL-C: 60 mg/dL (ref 0–99)
LP-IR Score: 34 (ref <=45)
Small LDL Particle Number: 161 nmol/L (ref <=527)
TRIGLYCERIDES BY NMR: 57 mg/dL (ref 0–149)

## 2016-02-02 LAB — VITAMIN D 25 HYDROXY (VIT D DEFICIENCY, FRACTURES): Vit D, 25-Hydroxy: 36.9 ng/mL (ref 30.0–100.0)

## 2016-02-04 ENCOUNTER — Telehealth: Payer: Self-pay | Admitting: Family Medicine

## 2016-02-04 ENCOUNTER — Other Ambulatory Visit: Payer: Medicare Other

## 2016-02-04 DIAGNOSIS — Z1211 Encounter for screening for malignant neoplasm of colon: Secondary | ICD-10-CM | POA: Diagnosis not present

## 2016-02-04 DIAGNOSIS — Z0000 Encounter for general adult medical examination without abnormal findings: Secondary | ICD-10-CM

## 2016-02-04 DIAGNOSIS — K921 Melena: Secondary | ICD-10-CM

## 2016-02-05 NOTE — Telephone Encounter (Signed)
Patient notified of lab results

## 2016-02-06 LAB — FECAL OCCULT BLOOD, IMMUNOCHEMICAL: FECAL OCCULT BLD: POSITIVE — AB

## 2016-02-15 ENCOUNTER — Telehealth: Payer: Self-pay | Admitting: Family Medicine

## 2016-02-18 ENCOUNTER — Telehealth: Payer: Self-pay | Admitting: Family Medicine

## 2016-02-18 MED ORDER — LORAZEPAM 0.5 MG PO TABS: 0.5000 mg | ORAL_TABLET | Freq: Two times a day (BID) | ORAL | Status: DC

## 2016-02-18 NOTE — Telephone Encounter (Signed)
Change lorazepam to one half to one twice daily as needed and continue with Lexapro. Please tell the patient that once he is been on Lexapro for a while that the panicky feeling will resolve and he may not need as much of the lorazepam which is more habit forming. Please call in a prescription for lorazepam No. 60 with the above directions

## 2016-02-18 NOTE — Telephone Encounter (Signed)
Patient has been taking lexapro for 12 days and states that he has been waking up more often at night and has a "panicky feeling".  Patient states that he started to take lorazepam in the morning and at bed time. Patient states that he is sleeping better and does not have the "panicky" feeling anymore.   Patient would like to know if he can have a rx stating that he can take medication twice daily.

## 2016-02-18 NOTE — Telephone Encounter (Signed)
Patient aware of recommendations.  

## 2016-02-21 NOTE — Telephone Encounter (Signed)
Please see telephone call from 5/22.

## 2016-02-28 ENCOUNTER — Telehealth: Payer: Self-pay | Admitting: *Deleted

## 2016-02-28 NOTE — Telephone Encounter (Signed)
i called pt to clarify all recent labs == he will come in Monday for repeat PSA and UA.  DWM - Pt says - since cholesterol has been good the last few times and he is down 8 lbs and he his diet is much better ----- could he decrease cholesterol meds??

## 2016-02-28 NOTE — Telephone Encounter (Signed)
Continue with current cholesterol treatment and therapeutic lifestyle changes if readings remain consistently is good as they are we might consider decreasing the medicines slightly after the next blood draw

## 2016-02-29 NOTE — Telephone Encounter (Signed)
Pt aware.

## 2016-03-04 ENCOUNTER — Other Ambulatory Visit: Payer: Medicare Other

## 2016-03-04 DIAGNOSIS — E8881 Metabolic syndrome: Secondary | ICD-10-CM

## 2016-03-04 DIAGNOSIS — I10 Essential (primary) hypertension: Secondary | ICD-10-CM | POA: Diagnosis not present

## 2016-03-04 LAB — BMP8+EGFR
BUN/Creatinine Ratio: 13 (ref 10–24)
BUN: 12 mg/dL (ref 8–27)
CALCIUM: 9.8 mg/dL (ref 8.6–10.2)
CHLORIDE: 101 mmol/L (ref 96–106)
CO2: 23 mmol/L (ref 18–29)
CREATININE: 0.9 mg/dL (ref 0.76–1.27)
GFR calc non Af Amer: 86 mL/min/{1.73_m2} (ref >59)
GFR, EST AFRICAN AMERICAN: 100 mL/min/{1.73_m2} (ref >59)
GLUCOSE: 104 mg/dL — AB (ref 65–99)
Potassium: 4.7 mmol/L (ref 3.5–5.2)
Sodium: 139 mmol/L (ref 134–144)

## 2016-04-14 ENCOUNTER — Other Ambulatory Visit: Payer: Self-pay | Admitting: Family Medicine

## 2016-04-14 DIAGNOSIS — IMO0002 Reserved for concepts with insufficient information to code with codable children: Secondary | ICD-10-CM

## 2016-04-14 DIAGNOSIS — M1710 Unilateral primary osteoarthritis, unspecified knee: Secondary | ICD-10-CM

## 2016-04-14 MED ORDER — HYDROCODONE-ACETAMINOPHEN 5-325 MG PO TABS: 1.0000 | ORAL_TABLET | Freq: Four times a day (QID) | ORAL | Status: DC | PRN

## 2016-04-14 NOTE — Telephone Encounter (Signed)
Last seen and filled 02/01/16

## 2016-06-12 ENCOUNTER — Other Ambulatory Visit: Payer: Self-pay | Admitting: Family Medicine

## 2016-06-20 ENCOUNTER — Encounter: Payer: Self-pay | Admitting: Family Medicine

## 2016-06-20 ENCOUNTER — Ambulatory Visit (INDEPENDENT_AMBULATORY_CARE_PROVIDER_SITE_OTHER): Payer: Medicare Other

## 2016-06-20 ENCOUNTER — Ambulatory Visit (INDEPENDENT_AMBULATORY_CARE_PROVIDER_SITE_OTHER): Payer: Medicare Other | Admitting: Family Medicine

## 2016-06-20 VITALS — BP 138/90 | HR 110 | Temp 97.5°F | Ht 66.0 in | Wt 176.0 lb

## 2016-06-20 DIAGNOSIS — F4323 Adjustment disorder with mixed anxiety and depressed mood: Secondary | ICD-10-CM

## 2016-06-20 DIAGNOSIS — M1710 Unilateral primary osteoarthritis, unspecified knee: Secondary | ICD-10-CM

## 2016-06-20 DIAGNOSIS — M159 Polyosteoarthritis, unspecified: Secondary | ICD-10-CM

## 2016-06-20 DIAGNOSIS — J410 Simple chronic bronchitis: Secondary | ICD-10-CM

## 2016-06-20 DIAGNOSIS — Z23 Encounter for immunization: Secondary | ICD-10-CM

## 2016-06-20 DIAGNOSIS — E785 Hyperlipidemia, unspecified: Secondary | ICD-10-CM | POA: Diagnosis not present

## 2016-06-20 DIAGNOSIS — R5383 Other fatigue: Secondary | ICD-10-CM | POA: Diagnosis not present

## 2016-06-20 DIAGNOSIS — R938 Abnormal findings on diagnostic imaging of other specified body structures: Secondary | ICD-10-CM | POA: Diagnosis not present

## 2016-06-20 DIAGNOSIS — J449 Chronic obstructive pulmonary disease, unspecified: Secondary | ICD-10-CM | POA: Insufficient documentation

## 2016-06-20 DIAGNOSIS — M179 Osteoarthritis of knee, unspecified: Secondary | ICD-10-CM | POA: Diagnosis not present

## 2016-06-20 DIAGNOSIS — IMO0002 Reserved for concepts with insufficient information to code with codable children: Secondary | ICD-10-CM

## 2016-06-20 DIAGNOSIS — M150 Primary generalized (osteo)arthritis: Secondary | ICD-10-CM

## 2016-06-20 DIAGNOSIS — R9389 Abnormal findings on diagnostic imaging of other specified body structures: Secondary | ICD-10-CM

## 2016-06-20 DIAGNOSIS — N400 Enlarged prostate without lower urinary tract symptoms: Secondary | ICD-10-CM

## 2016-06-20 DIAGNOSIS — I10 Essential (primary) hypertension: Secondary | ICD-10-CM

## 2016-06-20 DIAGNOSIS — E559 Vitamin D deficiency, unspecified: Secondary | ICD-10-CM | POA: Diagnosis not present

## 2016-06-20 MED ORDER — HYDROCODONE-ACETAMINOPHEN 5-325 MG PO TABS: 1.0000 | ORAL_TABLET | Freq: Four times a day (QID) | ORAL | 0 refills | Status: DC | PRN

## 2016-06-20 NOTE — Patient Instructions (Addendum)
Medicare Annual Wellness Visit  [facility] and the medical providers at [facility] strive to bring you the best medical care.  In doing so we not only want to address your current medical conditions and concerns but also to detect new conditions early and prevent illness, disease and health-related problems.    Medicare offers a yearly Wellness Visit which allows our clinical staff to assess your need for preventative services including immunizations, lifestyle education, counseling to decrease risk of preventable diseases and screening for fall risk and other medical concerns.    This visit is provided free of charge (no copay) for all Medicare recipients. The clinical pharmacists at [facility] have begun to conduct these Wellness Visits which will also include a thorough review of all your medications.    As you primary medical provider recommend that you make an appointment for your Annual Wellness Visit if you have not done so already this year.  You may set up this appointment before you leave today or you may call back [phone] and schedule an appointment.  Please make sure when you call that you mention that you are scheduling your Annual Wellness Visit with the clinical pharmacist so that the appointment may be made for the proper length of time.     Continue current medications. Continue good therapeutic lifestyle changes which include good diet and exercise. Fall precautions discussed with patient. If an FOBT was given today- please return it to our front desk. If you are over 58 years old - you may need Prevnar 15 or the adult Pneumonia vaccine.  **Flu shots are available--- please call and schedule a FLU-CLINIC appointment**  After your visit with [place] today you will receive a survey in the mail or online from [employer] regarding your care with [place]. Please take a moment to fill this out. Your feedback is very  important to [place] as you can help [place] better understand your patient needs as well as improve your experience and satisfaction. WE CARE ABOUT YOU!!!   The flu shot you see today may make your arm sore Continue to try to make every effort at stopping smoking Restart the Lexapro at 5 mg in the morning and after 2-3 weeks try to increase it back to 10 mg daily in the morning Try to walk and exercise more as this will be good for your arthritis and good for your emotional status Try to get back in church and get involved because people their love you so much Continue to drink plenty of fluids

## 2016-06-20 NOTE — Progress Notes (Signed)
Subjective:    Patient ID: Joshua Bowman, male    DOB: 11/10/1945, 70 y.o.   MRN: [id number]  HPI Pt here for follow up and management of chronic medical problems which includes hypertension and hyperlipidemia. He is taking medications regularly.The patient has chronic pain from his arthritis. He really needs to go get knee replacement. He is due to get a chest x-ray today. He has had an abnormal CT and it was followed over several years and no further CTs were recommended according to the radiologist. He is due to get his flu shot today and lab work today. The patient still has anxiety related issues and fatigue. The patient denies any chest pain or shortness of breath anymore than usual. He is having no trouble with heartburn indigestion nausea vomiting diarrhea blood in the stool or black tarry bowel movements. He is passing his water without problems. He still has some back pain and knee pain but says this is some better than it has been in the past. His biggest issues today are just he is in a follow up and he cannot get out of it which sounds like some depression type symptoms. He just lost his father during the past year and he is had issues dealing with that. He try some Lexapro but he felt like it Him awake at night and he has stopped that but he is willing to retry that starting at 5 mg in the morning. Blood pressures brought in from the outside are all good. A repeat blood pressure here was 138/90 in the right arm sitting.     Patient Active Problem List   Diagnosis Date Noted  . Coronary artery calcification 02/07/2015  . BPH (benign prostatic hyperplasia) 12/06/2013  . Metabolic syndrome 44/81/8563  . Carotid artery stenosis 12/06/2013  . Abnormal chest CT 08/17/2013  . Hypertension 03/30/2013  . Degenerative arthritis 03/30/2013  . Hyperlipidemia 03/30/2013  . Personal history of adenomatous colonic polyps 05/06/2012   Outpatient Encounter Prescriptions as of 06/20/2016    Medication Sig  . albuterol (PROAIR HFA) 108 (90 BASE) MCG/ACT inhaler INHALE ONE OR TWO PUFFS BY MOUTH FOUR TIMES DAILY AS NEEDED  . aspirin 325 MG tablet Take 325 mg by mouth daily.  . Cholecalciferol (VITAMIN D3) 5000 UNITS CAPS Take 1 tablet by mouth. 2-3 x week  . ezetimibe (ZETIA) 10 MG tablet Take 0.5 tablets (5 mg total) by mouth daily.  . fluticasone (FLONASE) 50 MCG/ACT nasal spray Place 2 sprays into both nostrils daily.  Marland Kitchen HYDROcodone-acetaminophen (NORCO/VICODIN) 5-325 MG tablet Take 1 tablet by mouth every 6 (six) hours as needed.  Marland Kitchen LORazepam (ATIVAN) 0.5 MG tablet TAKE 1 TABLET TWICE A DAY  . losartan (COZAAR) 50 MG tablet Take 1 tablet (50 mg total) by mouth daily.  . rosuvastatin (CRESTOR) 20 MG tablet Take 1 tablet (20 mg total) by mouth daily.  . SYMBICORT 160-4.5 MCG/ACT inhaler INHALE 2 PUFFS BY MOUTH INTO  THE LUNGS TWICE A DAY (RINSE MOUTH AFTER EACH USE)  . [DISCONTINUED] HYDROcodone-acetaminophen (NORCO/VICODIN) 5-325 MG tablet Take 1 tablet by mouth every 6 (six) hours as needed.  Marland Kitchen escitalopram (LEXAPRO) 10 MG tablet Take 1 tablet (10 mg total) by mouth daily. (Patient not taking: Reported on 06/20/2016)   No facility-administered encounter medications on file as of 06/20/2016.       Review of Systems  Constitutional: Positive for fatigue.  HENT: Negative.   Eyes: Negative.   Respiratory: Negative.   Cardiovascular: Negative.  Gastrointestinal: Negative.   Endocrine: Negative.   Genitourinary: Negative.   Musculoskeletal: Negative.   Skin: Negative.   Allergic/Immunologic: Negative.   Neurological: Negative.   Hematological: Negative.   Psychiatric/Behavioral: The patient is nervous/anxious (anxiety).        Objective:   Physical Exam  Constitutional: He is oriented to person, place, and time. He appears well-developed and well-nourished. No distress.  The patient is alert and pleasant  HENT:  Head: Normocephalic and atraumatic.  Right Ear:  External ear normal.  Left Ear: External ear normal.  Mouth/Throat: Oropharynx is clear and moist. No oropharyngeal exudate.  Nasal congestion right greater than left  Eyes: Conjunctivae and EOM are normal. Pupils are equal, round, and reactive to light. Right eye exhibits no discharge. Left eye exhibits no discharge. No scleral icterus.  Neck: Normal range of motion. Neck supple. No thyromegaly present.  No thyromegaly or bruits or adenopathy  Cardiovascular: Normal rate, regular rhythm, normal heart sounds and intact distal pulses.   No murmur heard. At 72/m  Pulmonary/Chest: Effort normal and breath sounds normal. No respiratory distress. He has no wheezes. He has no rales. He exhibits no tenderness.  Clear anteriorly and posteriorly  Abdominal: Soft. Bowel sounds are normal. He exhibits no mass. There is no tenderness. There is no rebound and no guarding.  No abdominal tenderness liver or spleen enlargement or inguinal adenopathy and no bruits  Musculoskeletal: Normal range of motion. He exhibits no edema.  Both knee joints are swollen and somewhat stiff. There is no redness rubor  Lymphadenopathy:    He has no cervical adenopathy.  Neurological: He is alert and oriented to person, place, and time. He has normal reflexes. No cranial nerve deficit.  Skin: Skin is warm and dry. No rash noted.  Psychiatric: He has a normal mood and affect. His behavior is normal. Judgment and thought content normal.  This patient is still dealing with the loss of his father and the obligations of taking care of his father's animals. He says he is feeling better regarding his anxiety and is trying to get himself back out and get involved [employer].  Nursing note and vitals reviewed.   BP (!) 158/97 (BP Location: Left Arm)   Pulse (!) 110   Temp 97.5 F (36.4 C) (Oral)   Ht '5\' 6"'$  (1.676 m)   Wt 176 lb (79.8 kg)   BMI 28.41 kg/m   Repeat blood pressure was 138/90 in the right arm sitting  Chest x-ray  with results pending     Assessment & Plan:  1. Essential hypertension -The repeat blood pressure in the office was improved but still slightly elevated for the diastolic. His home readings were good and we will not make any changes in his treatment. These readings were scanned into the record. - BMP8+EGFR - CBC with Differential/Platelet - Hepatic function panel - DG Chest 2 View; Future  2. Vitamin D deficiency -Continue current treatment pending results of lab work - CBC with Differential/Platelet - VITAMIN D 25 Hydroxy (Vit-D Deficiency, Fractures)  3. Hyperlipemia -Continue aggressive therapeutic lifestyle changes and current treatment pending results of lab work - CBC with Differential/Platelet - Lipid panel - DG Chest 2 View; Future  4. BPH (benign prostatic hyperplasia) -The patient denies any symptoms with voiding and no burning. - CBC with Differential/Platelet  5. Osteoarthrosis, unspecified whether generalized or localized, involving lower leg -He continues to have pain in both knees and his low back it seems to be less than  it was in the past. He is trying to walk and exercise more. - HYDROcodone-acetaminophen (NORCO/VICODIN) 5-325 MG tablet; Take 1 tablet by mouth every 6 (six) hours as needed.  Dispense: 120 tablet; Refill: 0  6. Abnormal chest CT -On reviewing the information regarding the chest CT he had several CTs in a row and it was felt like the abnormalities that were being followed did not need any further follow-up and were benign.  7. Primary osteoarthritis involving multiple joints -Continue to walk and exercise regularly  8. Simple chronic bronchitis ([place]) -Make every effort to stop smoking  9. Other fatigue -Try to exercise more - Thyroid Panel With TSH  10. Encounter for immunization - Flu vaccine HIGH DOSE PF  11. Adjustment reaction with anxiety and depression -Restart Lexapro at 5 mg daily and after 2-3 weeks increase to 10 mg  daily  Meds ordered this encounter  Medications  . HYDROcodone-acetaminophen (NORCO/VICODIN) 5-325 MG tablet    Sig: Take 1 tablet by mouth every 6 (six) hours as needed.    Dispense:  120 tablet    Refill:  0   Patient Instructions                       Medicare Annual Wellness Visit  [facility] and the medical providers at [facility] strive to bring you the best medical care.  In doing so we not only want to address your current medical conditions and concerns but also to detect new conditions early and prevent illness, disease and health-related problems.    Medicare offers a yearly Wellness Visit which allows our clinical staff to assess your need for preventative services including immunizations, lifestyle education, counseling to decrease risk of preventable diseases and screening for fall risk and other medical concerns.    This visit is provided free of charge (no copay) for all Medicare recipients. The clinical pharmacists at [facility] have begun to conduct these Wellness Visits which will also include a thorough review of all your medications.    As you primary medical provider recommend that you make an appointment for your Annual Wellness Visit if you have not done so already this year.  You may set up this appointment before you leave today or you may call back ([phone]) and schedule an appointment.  Please make sure when you call that you mention that you are scheduling your Annual Wellness Visit with the clinical pharmacist so that the appointment may be made for the proper length of time.     Continue current medications. Continue good therapeutic lifestyle changes which include good diet and exercise. Fall precautions discussed with patient. If an FOBT was given today- please return it to our front desk. If you are over 29 years old - you may need Prevnar 56 or the adult Pneumonia vaccine.  **Flu shots are available---  please call and schedule a FLU-CLINIC appointment**  After your visit with [place] today you will receive a survey in the mail or online from [employer] regarding your care with [place]. Please take a moment to fill this out. Your feedback is very important to [place] as you can help [place] better understand your patient needs as well as improve your experience and satisfaction. WE CARE ABOUT YOU!!!   The flu shot you see today may make your arm sore Continue to try to make every effort at stopping smoking Restart the Lexapro at 5 mg in the morning and  after 2-3 weeks try to increase it back to 10 mg daily in the morning Try to walk and exercise more as this will be good for your arthritis and good for your emotional status Try to get back in church and get involved because people their love you so much Continue to drink plenty of fluids    Arrie Senate MD

## 2016-06-22 LAB — BMP8+EGFR
BUN/Creatinine Ratio: 10 (ref 10–24)
BUN: 11 mg/dL (ref 8–27)
CALCIUM: 9.8 mg/dL (ref 8.6–10.2)
CHLORIDE: 98 mmol/L (ref 96–106)
CO2: 24 mmol/L (ref 18–29)
Creatinine, Ser: 1.09 mg/dL (ref 0.76–1.27)
GFR calc non Af Amer: 68 mL/min/{1.73_m2} (ref >59)
GFR, EST AFRICAN AMERICAN: 79 mL/min/{1.73_m2} (ref >59)
Glucose: 109 mg/dL — ABNORMAL HIGH (ref 65–99)
Potassium: 4.7 mmol/L (ref 3.5–5.2)
Sodium: 140 mmol/L (ref 134–144)

## 2016-06-22 LAB — HEPATIC FUNCTION PANEL
ALBUMIN: 4.6 g/dL (ref 3.5–4.8)
ALK PHOS: 56 IU/L (ref 39–117)
ALT: 12 IU/L (ref 0–44)
AST: 18 IU/L (ref 0–40)
BILIRUBIN TOTAL: 0.3 mg/dL (ref 0.0–1.2)
BILIRUBIN, DIRECT: 0.09 mg/dL (ref 0.00–0.40)
TOTAL PROTEIN: 6.9 g/dL (ref 6.0–8.5)

## 2016-06-22 LAB — CBC WITH DIFFERENTIAL/PLATELET
BASOS ABS: 0.1 10*3/uL (ref 0.0–0.2)
Basos: 1 %
EOS (ABSOLUTE): 0 10*3/uL (ref 0.0–0.4)
Eos: 0 %
HEMOGLOBIN: 18.1 g/dL — AB (ref 12.6–17.7)
Hematocrit: 52.4 % — ABNORMAL HIGH (ref 37.5–51.0)
IMMATURE GRANS (ABS): 0 10*3/uL (ref 0.0–0.1)
IMMATURE GRANULOCYTES: 0 %
LYMPHS: 17 %
Lymphocytes Absolute: 1.7 10*3/uL (ref 0.7–3.1)
MCH: 32.1 pg (ref 26.6–33.0)
MCHC: 34.5 g/dL (ref 31.5–35.7)
MCV: 93 fL (ref 79–97)
MONOCYTES: 6 %
Monocytes Absolute: 0.6 10*3/uL (ref 0.1–0.9)
NEUTROS ABS: 7.3 10*3/uL — AB (ref 1.4–7.0)
Neutrophils: 76 %
Platelets: 237 10*3/uL (ref 150–379)
RBC: 5.63 x10E6/uL (ref 4.14–5.80)
RDW: 14 % (ref 12.3–15.4)
WBC: 9.7 10*3/uL (ref 3.4–10.8)

## 2016-06-22 LAB — LIPID PANEL
CHOL/HDL RATIO: 3.5 ratio (ref 0.0–5.0)
Cholesterol, Total: 227 mg/dL — ABNORMAL HIGH (ref 100–199)
HDL: 64 mg/dL (ref >39)
LDL CALC: 144 mg/dL — AB (ref 0–99)
Triglycerides: 97 mg/dL (ref 0–149)
VLDL Cholesterol Cal: 19 mg/dL (ref 5–40)

## 2016-06-22 LAB — THYROID PANEL WITH TSH
Free Thyroxine Index: 2 (ref 1.2–4.9)
T3 Uptake Ratio: 27 % (ref 24–39)
T4 TOTAL: 7.4 ug/dL (ref 4.5–12.0)
TSH: 0.989 u[IU]/mL (ref 0.450–4.500)

## 2016-06-22 LAB — VITAMIN D 25 HYDROXY (VIT D DEFICIENCY, FRACTURES): Vit D, 25-Hydroxy: 32.3 ng/mL (ref 30.0–100.0)

## 2016-06-23 ENCOUNTER — Telehealth: Payer: Self-pay | Admitting: Family Medicine

## 2016-06-23 NOTE — Telephone Encounter (Signed)
Pt notified of results Verbalizes understanding 

## 2016-08-11 ENCOUNTER — Other Ambulatory Visit: Payer: Self-pay | Admitting: Family Medicine

## 2016-08-12 NOTE — Telephone Encounter (Signed)
Refill called to [facility] 

## 2016-08-27 ENCOUNTER — Telehealth: Payer: Self-pay | Admitting: Family Medicine

## 2016-08-27 NOTE — Telephone Encounter (Signed)
appt made and pt understands the pain contract

## 2016-08-28 ENCOUNTER — Encounter: Payer: Self-pay | Admitting: Family Medicine

## 2016-08-28 ENCOUNTER — Ambulatory Visit (INDEPENDENT_AMBULATORY_CARE_PROVIDER_SITE_OTHER): Payer: Medicare Other | Admitting: Family Medicine

## 2016-08-28 VITALS — BP 167/89 | HR 105 | Temp 97.1°F | Ht 66.0 in | Wt 181.0 lb

## 2016-08-28 DIAGNOSIS — M1711 Unilateral primary osteoarthritis, right knee: Secondary | ICD-10-CM | POA: Diagnosis not present

## 2016-08-28 DIAGNOSIS — R52 Pain, unspecified: Secondary | ICD-10-CM

## 2016-08-28 DIAGNOSIS — Z79891 Long term (current) use of opiate analgesic: Secondary | ICD-10-CM | POA: Diagnosis not present

## 2016-08-28 DIAGNOSIS — M4716 Other spondylosis with myelopathy, lumbar region: Secondary | ICD-10-CM

## 2016-08-28 DIAGNOSIS — M1712 Unilateral primary osteoarthritis, left knee: Secondary | ICD-10-CM

## 2016-08-28 MED ORDER — HYDROCODONE-ACETAMINOPHEN 5-325 MG PO TABS: 1.0000 | ORAL_TABLET | Freq: Four times a day (QID) | ORAL | 0 refills | Status: DC | PRN

## 2016-08-28 NOTE — Progress Notes (Signed)
Subjective:    Patient ID: Joshua Bowman, male    DOB: November 04, 1945, 70 y.o.   MRN: [id number]  HPI Patient here today for pain management and contract for pain control. He complains of back pain and bilateral knee pain.This is been going on for several years. The patient really has end-stage arthritis in his knees. All of this is due to osteoarthritis. He continues to refrain from having a knee replacement. The patient also has some elements of anxiety and depression because he recently lost his dad. The patient says that a lot of times he can get by with less pain pill sometimes One-A-Day and if he is out working he may have to take 4 daily. We kept encouraging him to do with less and hopefully we can get him off of this pain medicine or he should go get his knees replaced. The patient indicates that he took his blood pressure this morning at home and it was 126/71 in general this is what it runs at home. He seems to have elevated blood pressures primarily in the office. We will therefore not address this problem today. The left knee hurts more than the right knee. He has seen the orthopedic surgeon.   Patient Active Problem List   Diagnosis Date Noted  . COPD (chronic obstructive pulmonary disease) ([place]) 06/20/2016  . Coronary artery calcification 02/07/2015  . BPH (benign prostatic hyperplasia) 12/06/2013  . Metabolic syndrome [id number]  . Carotid artery stenosis 12/06/2013  . Abnormal chest CT 08/17/2013  . Hypertension 03/30/2013  . Degenerative arthritis 03/30/2013  . Hyperlipidemia 03/30/2013  . Personal history of adenomatous colonic polyps 05/06/2012   Outpatient Encounter Prescriptions as of 08/28/2016  Medication Sig  . albuterol (PROAIR HFA) 108 (90 BASE) MCG/ACT inhaler INHALE ONE OR TWO PUFFS BY MOUTH FOUR TIMES DAILY AS NEEDED  . aspirin 325 MG tablet Take 325 mg by mouth daily.  . Cholecalciferol (VITAMIN D3) 5000 UNITS CAPS Take 1 tablet by mouth. 2-3 x week  .  escitalopram (LEXAPRO) 10 MG tablet Take 1 tablet (10 mg total) by mouth daily.  Marland Kitchen ezetimibe (ZETIA) 10 MG tablet Take 0.5 tablets (5 mg total) by mouth daily.  . fluticasone (FLONASE) 50 MCG/ACT nasal spray Place 2 sprays into both nostrils daily.  Marland Kitchen HYDROcodone-acetaminophen (NORCO/VICODIN) 5-325 MG tablet Take 1 tablet by mouth every 6 (six) hours as needed.  Marland Kitchen LORazepam (ATIVAN) 0.5 MG tablet TAKE 1 TABLET TWICE A DAY AS NEEDED  . losartan (COZAAR) 50 MG tablet Take 1 tablet (50 mg total) by mouth daily.  . rosuvastatin (CRESTOR) 20 MG tablet Take 1 tablet (20 mg total) by mouth daily.  . SYMBICORT 160-4.5 MCG/ACT inhaler INHALE 2 PUFFS BY MOUTH INTO  THE LUNGS TWICE A DAY (RINSE MOUTH AFTER EACH USE)   No facility-administered encounter medications on file as of 08/28/2016.       Review of Systems  Constitutional: Negative.   HENT: Negative.   Eyes: Negative.   Respiratory: Negative.   Cardiovascular: Negative.   Gastrointestinal: Negative.   Endocrine: Negative.   Genitourinary: Negative.   Musculoskeletal: Positive for arthralgias (bilateral knee pain) and back pain.  Skin: Negative.   Allergic/Immunologic: Negative.   Neurological: Negative.   Hematological: Negative.   Psychiatric/Behavioral: Negative.        Objective:   Physical Exam  Constitutional: He is oriented to person, place, and time. He appears well-developed and well-nourished. No distress.  Pleasant and alert  HENT:  Head: Atraumatic.  Eyes: Conjunctivae and EOM are normal. Pupils are equal, round, and reactive to light. Right eye exhibits no discharge. Left eye exhibits no discharge. No scleral icterus.  Neck: Normal range of motion.  Musculoskeletal: He exhibits tenderness.  Range of motion is somewhat limited especially with standing sitting. There is also some discomfort in both knees with the left being greater than the right and depending on activity that he is currently involved in.  Neurological:  He is alert and oriented to person, place, and time.  Skin: Skin is warm and dry.  Psychiatric: He has a normal mood and affect. His behavior is normal. Judgment and thought content normal.  Nursing note and vitals reviewed.  BP (!) 167/89   Pulse (!) 105   Temp 97.1 F (36.2 C) (Oral)   Ht 5\' 6"  (1.676 m)   Wt 181 lb (82.1 kg)   BMI 29.21 kg/m         Assessment & Plan:  1. Pain management -The patient has ongoing chronic pain in both knees and his low back secondary to osteoarthritis. This continues to flare based on his activity from day-to-day. -The patient is aware of the reasons for doing this pain contract and we will oblige to whatever is needed to make it work for him. - ToxASSURE Select 13 (MW), Urine  2. Primary osteoarthritis of left knee -He has seen the orthopedic surgeon and nose that in the future he will probably have to have knee replacements and most likely bilaterally.  3. Primary osteoarthritis of right knee -Take pain medicine as needed and periodically try an NSAID like Aleve and/or extra strength Tylenol. Continue to follow-up with orthopedic surgeon.  4. Osteoarthritis of lumbar spine with myelopathy -Avoid heavy lifting and straining and take pain medicine as needed and/or ibuprofen or Tylenol.  Meds ordered this encounter  Medications  . HYDROcodone-acetaminophen (NORCO/VICODIN) 5-325 MG tablet    Sig: Take 1 tablet by mouth every 6 (six) hours as needed.    Dispense:  120 tablet    Refill:  0  . HYDROcodone-acetaminophen (NORCO/VICODIN) 5-325 MG tablet    Sig: Take 1 tablet by mouth every 6 (six) hours as needed for moderate pain.    Dispense:  120 tablet    Refill:  0    DO NOT FILL UNTIL 09/26/16.  Marland Kitchen HYDROcodone-acetaminophen (NORCO/VICODIN) 5-325 MG tablet    Sig: Take 1 tablet by mouth every 6 (six) hours as needed for moderate pain.    Dispense:  120 tablet    Refill:  0    DO NOT FILL UNTIL 10/26/16.   Patient Instructions  The  patient will take his hydrocodone 5/325 one 4 times a day if needed and less if possible. He understands the reasoning behind signing the pain contract and understands that he needs to take as little of this medicine as possible. He will take as needed for his back pain and bilateral knee pain.  Arrie Senate MD

## 2016-08-28 NOTE — Patient Instructions (Addendum)
The patient will take his hydrocodone 5/325 one 4 times a day if needed and less if possible. He understands the reasoning behind signing the pain contract and understands that he needs to take as little of this medicine as possible. He will take as needed for his back pain and bilateral knee pain.

## 2016-09-03 LAB — TOXASSURE SELECT 13 (MW), URINE

## 2016-10-06 ENCOUNTER — Other Ambulatory Visit: Payer: Self-pay | Admitting: Family Medicine

## 2016-10-09 ENCOUNTER — Other Ambulatory Visit: Payer: Self-pay | Admitting: *Deleted

## 2016-10-09 MED ORDER — LORAZEPAM 0.5 MG PO TABS: 0.5000 mg | ORAL_TABLET | Freq: Two times a day (BID) | ORAL | 1 refills | Status: DC | PRN

## 2016-10-10 ENCOUNTER — Other Ambulatory Visit: Payer: Self-pay | Admitting: Family Medicine

## 2016-10-10 NOTE — Telephone Encounter (Signed)
Refill called to [facility] 

## 2016-10-28 ENCOUNTER — Encounter: Payer: Self-pay | Admitting: Family Medicine

## 2016-10-28 ENCOUNTER — Ambulatory Visit (INDEPENDENT_AMBULATORY_CARE_PROVIDER_SITE_OTHER): Payer: Medicare Other | Admitting: Family Medicine

## 2016-10-28 VITALS — BP 128/85 | HR 100 | Temp 97.1°F | Ht 66.0 in | Wt 177.0 lb

## 2016-10-28 DIAGNOSIS — R52 Pain, unspecified: Secondary | ICD-10-CM | POA: Diagnosis not present

## 2016-10-28 DIAGNOSIS — N400 Enlarged prostate without lower urinary tract symptoms: Secondary | ICD-10-CM

## 2016-10-28 DIAGNOSIS — M4716 Other spondylosis with myelopathy, lumbar region: Secondary | ICD-10-CM

## 2016-10-28 DIAGNOSIS — E7800 Pure hypercholesterolemia, unspecified: Secondary | ICD-10-CM | POA: Diagnosis not present

## 2016-10-28 DIAGNOSIS — J42 Unspecified chronic bronchitis: Secondary | ICD-10-CM

## 2016-10-28 DIAGNOSIS — E559 Vitamin D deficiency, unspecified: Secondary | ICD-10-CM

## 2016-10-28 DIAGNOSIS — G8929 Other chronic pain: Secondary | ICD-10-CM | POA: Diagnosis not present

## 2016-10-28 DIAGNOSIS — M5442 Lumbago with sciatica, left side: Secondary | ICD-10-CM

## 2016-10-28 DIAGNOSIS — I10 Essential (primary) hypertension: Secondary | ICD-10-CM | POA: Diagnosis not present

## 2016-10-28 DIAGNOSIS — M5441 Lumbago with sciatica, right side: Secondary | ICD-10-CM

## 2016-10-28 MED ORDER — HYDROCODONE-ACETAMINOPHEN 5-325 MG PO TABS: 1.0000 | ORAL_TABLET | Freq: Four times a day (QID) | ORAL | 0 refills | Status: DC | PRN

## 2016-10-28 NOTE — Progress Notes (Signed)
Subjective:    Patient ID: Joshua Bowman, male    DOB: 06-04-1946, 71 y.o.   MRN: [id number]  HPI Pt here for follow up and management of chronic medical problems which includes hypertension and hyperlipidemia. He is taking medications regularly. The patient brings in home blood pressures for review and all of these were good. There we scanned into the record. The initial blood pressure today was elevated but a repeat was good. The patient had some type of flu like illness the second week of January with fever aching and malaise. He is better from that. He did develop a rash during the respiratory infection on his low back which appears to be healing some now. He is not using any specific fabric softeners or detergents. We reminded him that he should still away from fabric softener soaps and detergents that are scented. He did use the heating pad on his back during this time and this could have irritated his back. He denies any chest pain or shortness of breath other than getting over this flulike illness. He is no trouble currently with nausea vomiting diarrhea blood in the stool or black tarry bowel movements. He is passing his water currently without problems. He has his ongoing back pain and knee pain that he takes his pain medication for.   Patient Active Problem List   Diagnosis Date Noted  . COPD (chronic obstructive pulmonary disease) ([place]) 06/20/2016  . Coronary artery calcification 02/07/2015  . BPH (benign prostatic hyperplasia) 12/06/2013  . Metabolic syndrome 98/92/1194  . Carotid artery stenosis 12/06/2013  . Abnormal chest CT 08/17/2013  . Hypertension 03/30/2013  . Degenerative arthritis 03/30/2013  . Hyperlipidemia 03/30/2013  . Personal history of adenomatous colonic polyps 05/06/2012   Outpatient Encounter Prescriptions as of 10/28/2016  Medication Sig  . albuterol (PROAIR HFA) 108 (90 BASE) MCG/ACT inhaler INHALE ONE OR TWO PUFFS BY MOUTH FOUR TIMES DAILY AS NEEDED  .  aspirin 325 MG tablet Take 325 mg by mouth daily.  . Cholecalciferol (VITAMIN D3) 5000 UNITS CAPS Take 1 tablet by mouth. 2-3 x week  . ezetimibe (ZETIA) 10 MG tablet Take 0.5 tablets (5 mg total) by mouth daily.  Marland Kitchen HYDROcodone-acetaminophen (NORCO/VICODIN) 5-325 MG tablet Take 1 tablet by mouth every 6 (six) hours as needed.  Marland Kitchen LORazepam (ATIVAN) 0.5 MG tablet Take 1 tablet (0.5 mg total) by mouth 2 (two) times daily as needed.  Marland Kitchen losartan (COZAAR) 50 MG tablet TAKE 1 TABLET (50 MG TOTAL) BY MOUTH DAILY.  . rosuvastatin (CRESTOR) 20 MG tablet Take 1 tablet (20 mg total) by mouth daily.  . SYMBICORT 160-4.5 MCG/ACT inhaler INHALE 2 PUFFS BY MOUTH INTO THE LUNGS TWICE A DAY (RINSE MOUTH AFTER EACH USE)  . HYDROcodone-acetaminophen (NORCO/VICODIN) 5-325 MG tablet Take 1 tablet by mouth every 6 (six) hours as needed for moderate pain. (Patient not taking: Reported on 10/28/2016)  . HYDROcodone-acetaminophen (NORCO/VICODIN) 5-325 MG tablet Take 1 tablet by mouth every 6 (six) hours as needed for moderate pain. (Patient not taking: Reported on 10/28/2016)  . [DISCONTINUED] escitalopram (LEXAPRO) 10 MG tablet Take 1 tablet (10 mg total) by mouth daily.  . [DISCONTINUED] fluticasone (FLONASE) 50 MCG/ACT nasal spray Place 2 sprays into both nostrils daily.   No facility-administered encounter medications on file as of 10/28/2016.       Review of Systems  Constitutional: Negative.   HENT: Negative.   Eyes: Negative.   Respiratory: Negative.   Cardiovascular: Negative.   Gastrointestinal: Negative.  Endocrine: Negative.   Genitourinary: Negative.   Musculoskeletal: Negative.   Skin: Positive for rash (on back ).  Allergic/Immunologic: Negative.   Neurological: Negative.   Hematological: Negative.   Psychiatric/Behavioral: Negative.        Objective:   Physical Exam  Constitutional: He is oriented to person, place, and time. He appears well-developed and well-nourished. No distress.  The  patient is pleasant and alert.  HENT:  Head: Normocephalic and atraumatic.  Right Ear: External ear normal.  Left Ear: External ear normal.  Mouth/Throat: Oropharynx is clear and moist. No oropharyngeal exudate.  Nasal congestion bilaterally  Eyes: Conjunctivae and EOM are normal. Pupils are equal, round, and reactive to light. Right eye exhibits no discharge. Left eye exhibits no discharge. No scleral icterus.  Neck: Normal range of motion. Neck supple. No thyromegaly present.  No thyroid enlargement bruits or adenopathy  Cardiovascular: Normal rate, regular rhythm, normal heart sounds and intact distal pulses.   No murmur heard. The heart has a regular rate and rhythm at 72/m  Pulmonary/Chest: Effort normal and breath sounds normal. No respiratory distress. He has no wheezes. He has no rales. He exhibits no tenderness.  Clear anteriorly and posteriorly no axillary adenopathy and no chest wall masses.  Abdominal: Soft. Bowel sounds are normal. He exhibits no mass. There is no tenderness. There is no rebound and no guarding.  No inguinal adenopathy No abdominal tenderness liver or spleen enlargement or bruits  Musculoskeletal: Normal range of motion. He exhibits no edema.  Lymphadenopathy:    He has no cervical adenopathy.  Neurological: He is alert and oriented to person, place, and time. He has normal reflexes. No cranial nerve deficit.  Skin: Skin is warm and dry. Rash noted.  There is a resolving rash in the flank areas bilaterally. This is minimal. Slight erythema and no drainage.  Psychiatric: He has a normal mood and affect. His behavior is normal. Judgment and thought content normal.  Nursing note and vitals reviewed.  BP 128/85   Pulse 100   Temp 97.1 F (36.2 C) (Oral)   Ht '5\' 6"'  (1.676 m)   Wt 177 lb (80.3 kg)   BMI 28.57 kg/m         Assessment & Plan:  1. Essential hypertension -The blood pressure is good today and he will continue with current treatment and with  home readings. - BMP8+EGFR - CBC with Differential/Platelet - Hepatic function panel  2. Pure hypercholesterolemia -Continue with current treatment pending results of lab work - CBC with Differential/Platelet - NMR, lipoprofile  3. Vitamin D deficiency -Continue with vitamin D replacement - CBC with Differential/Platelet - VITAMIN D 25 Hydroxy (Vit-D Deficiency, Fractures)  4. Benign prostatic hyperplasia, unspecified whether lower urinary tract symptoms present -No complaints with voiding today. - CBC with Differential/Platelet  5. Pain management *-The patient still has ongoing pain. It is worse in the knees with the left knee being worse than the right. When he is on his feet a lot he has more chronic low back pain. He should try to stay is active as possible. - CBC with Differential/Platelet  6. Osteoarthritis of lumbar spine with myelopathy -Continue with activity as much as possible and do not put himself at risk for falling  7. Chronic bronchitis, unspecified chronic bronchitis type ([place]) -Continue good pulmonary hygiene, keeping the house as cool as possible, taking Mucinex using inhaler and using a cool mist humidifier  8. Chronic bilateral low back pain with bilateral sciatica -Continue with  pain medicine as needed but more importantly to try to walk regularly and exercise regularly especially as the weather warms up more.  Meds ordered this encounter  Medications  . HYDROcodone-acetaminophen (NORCO/VICODIN) 5-325 MG tablet    Sig: Take 1 tablet by mouth every 6 (six) hours as needed.    Dispense:  120 tablet    Refill:  0    Do not fill until 01/21/17.  Marland Kitchen HYDROcodone-acetaminophen (NORCO/VICODIN) 5-325 MG tablet    Sig: Take 1 tablet by mouth every 6 (six) hours as needed for moderate pain.    Dispense:  120 tablet    Refill:  0    DO NOT FILL UNTIL 12/22/16.  Marland Kitchen HYDROcodone-acetaminophen (NORCO/VICODIN) 5-325 MG tablet    Sig: Take 1 tablet by mouth every 6 (six)  hours as needed for moderate pain.    Dispense:  120 tablet    Refill:  0    DO NOT FILL UNTIL 11/25/16.   Patient Instructions                       Medicare Annual Wellness Visit  [facility] and the medical providers at [facility] strive to bring you the best medical care.  In doing so we not only want to address your current medical conditions and concerns but also to detect new conditions early and prevent illness, disease and health-related problems.    Medicare offers a yearly Wellness Visit which allows our clinical staff to assess your need for preventative services including immunizations, lifestyle education, counseling to decrease risk of preventable diseases and screening for fall risk and other medical concerns.    This visit is provided free of charge (no copay) for all Medicare recipients. The clinical pharmacists at [facility] have begun to conduct these Wellness Visits which will also include a thorough review of all your medications.    As you primary medical provider recommend that you make an appointment for your Annual Wellness Visit if you have not done so already this year.  You may set up this appointment before you leave today or you may call back [phone]) and schedule an appointment.  Please make sure when you call that you mention that you are scheduling your Annual Wellness Visit with the clinical pharmacist so that the appointment may be made for the proper length of time.     Continue current medications. Continue good therapeutic lifestyle changes which include good diet and exercise. Fall precautions discussed with patient. If an FOBT was given today- please return it to our front desk. If you are over 70 years old - you may need Prevnar 79 or the adult Pneumonia vaccine.  **Flu shots are available--- please call and schedule a FLU-CLINIC appointment**  After your visit with [place] today you will receive a survey  in the mail or online from [employer] regarding your care with [place]. Please take a moment to fill this out. Your feedback is very important to [place] as you can help [place] better understand your patient needs as well as improve your experience and satisfaction. WE CARE ABOUT YOU!!!   Continue with all efforts at fall prevention Continue efforts with good pulmonary hygiene and hand hygiene during this flu season Continue with pain medicines as needed for chronic back pain Use soaps or fabric softeners and detergents that are scent free For 4-5 days try a small amount of cortisone 10 applying this sparingly twice daily to  the rash that is healing on the low back Avoid the use of heating pads in the future Continue to practice good respiratory and hand hygiene Use a cool mist humidifier and keep the house as cool as possible Take Mucinex maximum strength twice daily for cough and congestion and use inhaler as needed    Arrie Senate MD

## 2016-10-28 NOTE — Patient Instructions (Addendum)
Medicare Annual Wellness Visit  [facility] and the medical providers at [facility] strive to bring you the best medical care.  In doing so we not only want to address your current medical conditions and concerns but also to detect new conditions early and prevent illness, disease and health-related problems.    Medicare offers a yearly Wellness Visit which allows our clinical staff to assess your need for preventative services including immunizations, lifestyle education, counseling to decrease risk of preventable diseases and screening for fall risk and other medical concerns.    This visit is provided free of charge (no copay) for all Medicare recipients. The clinical pharmacists at [facility] have begun to conduct these Wellness Visits which will also include a thorough review of all your medications.    As you primary medical provider recommend that you make an appointment for your Annual Wellness Visit if you have not done so already this year.  You may set up this appointment before you leave today or you may call back [phone] and schedule an appointment.  Please make sure when you call that you mention that you are scheduling your Annual Wellness Visit with the clinical pharmacist so that the appointment may be made for the proper length of time.     Continue current medications. Continue good therapeutic lifestyle changes which include good diet and exercise. Fall precautions discussed with patient. If an FOBT was given today- please return it to our front desk. If you are over 92 years old - you may need Prevnar 2 or the adult Pneumonia vaccine.  **Flu shots are available--- please call and schedule a FLU-CLINIC appointment**  After your visit with [place] today you will receive a survey in the mail or online from [employer] regarding your care with [place]. Please take a moment to fill this out. Your feedback is very  important to [place] as you can help [place] better understand your patient needs as well as improve your experience and satisfaction. WE CARE ABOUT YOU!!!   Continue with all efforts at fall prevention Continue efforts with good pulmonary hygiene and hand hygiene during this flu season Continue with pain medicines as needed for chronic back pain Use soaps or fabric softeners and detergents that are scent free For 4-5 days try a small amount of cortisone 10 applying this sparingly twice daily to the rash that is healing on the low back Avoid the use of heating pads in the future Continue to practice good respiratory and hand hygiene Use a cool mist humidifier and keep the house as cool as possible Take Mucinex maximum strength twice daily for cough and congestion and use inhaler as needed

## 2016-10-29 LAB — CBC WITH DIFFERENTIAL/PLATELET
Basophils Absolute: 0 10*3/uL (ref 0.0–0.2)
Basos: 0 %
EOS (ABSOLUTE): 0 10*3/uL (ref 0.0–0.4)
EOS: 0 %
HEMATOCRIT: 50.1 % (ref 37.5–51.0)
Hemoglobin: 17.1 g/dL (ref 13.0–17.7)
IMMATURE GRANULOCYTES: 0 %
Immature Grans (Abs): 0 10*3/uL (ref 0.0–0.1)
LYMPHS ABS: 1.5 10*3/uL (ref 0.7–3.1)
LYMPHS: 16 %
MCH: 31.4 pg (ref 26.6–33.0)
MCHC: 34.1 g/dL (ref 31.5–35.7)
MCV: 92 fL (ref 79–97)
MONOCYTES: 7 %
Monocytes Absolute: 0.7 10*3/uL (ref 0.1–0.9)
NEUTROS PCT: 77 %
Neutrophils Absolute: 7.2 10*3/uL — ABNORMAL HIGH (ref 1.4–7.0)
PLATELETS: 271 10*3/uL (ref 150–379)
RBC: 5.45 x10E6/uL (ref 4.14–5.80)
RDW: 14.3 % (ref 12.3–15.4)
WBC: 9.5 10*3/uL (ref 3.4–10.8)

## 2016-10-29 LAB — BMP8+EGFR
BUN/Creatinine Ratio: 10 (ref 10–24)
BUN: 10 mg/dL (ref 8–27)
CALCIUM: 9.4 mg/dL (ref 8.6–10.2)
CHLORIDE: 99 mmol/L (ref 96–106)
CO2: 24 mmol/L (ref 18–29)
Creatinine, Ser: 0.99 mg/dL (ref 0.76–1.27)
GFR, EST AFRICAN AMERICAN: 89 mL/min/{1.73_m2} (ref >59)
GFR, EST NON AFRICAN AMERICAN: 77 mL/min/{1.73_m2} (ref >59)
Glucose: 101 mg/dL — ABNORMAL HIGH (ref 65–99)
POTASSIUM: 4.5 mmol/L (ref 3.5–5.2)
SODIUM: 138 mmol/L (ref 134–144)

## 2016-10-29 LAB — VITAMIN D 25 HYDROXY (VIT D DEFICIENCY, FRACTURES): Vit D, 25-Hydroxy: 30.5 ng/mL (ref 30.0–100.0)

## 2016-10-29 LAB — NMR, LIPOPROFILE
Cholesterol: 160 mg/dL (ref 100–199)
HDL Cholesterol by NMR: 48 mg/dL (ref >39)
HDL PARTICLE NUMBER: 34.4 umol/L (ref >=30.5)
LDL Particle Number: 1083 nmol/L — ABNORMAL HIGH (ref <1000)
LDL SIZE: 20.6 nm (ref >20.5)
LDL-C: 97 mg/dL (ref 0–99)
LP-IR SCORE: 41 (ref <=45)
SMALL LDL PARTICLE NUMBER: 556 nmol/L — AB (ref <=527)
Triglycerides by NMR: 76 mg/dL (ref 0–149)

## 2016-10-29 LAB — HEPATIC FUNCTION PANEL
ALBUMIN: 4.3 g/dL (ref 3.5–4.8)
ALT: 13 IU/L (ref 0–44)
AST: 16 IU/L (ref 0–40)
Alkaline Phosphatase: 47 IU/L (ref 39–117)
BILIRUBIN TOTAL: 0.3 mg/dL (ref 0.0–1.2)
Bilirubin, Direct: 0.09 mg/dL (ref 0.00–0.40)
Total Protein: 6.4 g/dL (ref 6.0–8.5)

## 2016-11-15 ENCOUNTER — Other Ambulatory Visit: Payer: Self-pay | Admitting: Family Medicine

## 2016-12-05 ENCOUNTER — Other Ambulatory Visit: Payer: Self-pay | Admitting: Family Medicine

## 2016-12-12 ENCOUNTER — Other Ambulatory Visit: Payer: Self-pay | Admitting: Family Medicine

## 2017-01-22 ENCOUNTER — Telehealth: Payer: Self-pay | Admitting: Family Medicine

## 2017-01-22 NOTE — Telephone Encounter (Signed)
What is the name of the medication? Voltaren Gel 1% Patient hadn't used in three years and needs to use it on his knees.  Have you contacted your pharmacy to request a refill? NO  Which pharmacy would you like this sent to? [facility] in [place]  Patient notified that their request is being sent to the clinical staff for review and that they should receive a call once it is complete. If they do not receive a call within 24 hours they can check with their pharmacy or our office.

## 2017-01-23 MED ORDER — DICLOFENAC SODIUM 1 % TD GEL
4.0000 g | Freq: Two times a day (BID) | TRANSDERMAL | 2 refills | Status: AC
Start: 2017-01-23 — End: ?

## 2017-02-04 ENCOUNTER — Other Ambulatory Visit: Payer: Self-pay | Admitting: Family Medicine

## 2017-02-06 ENCOUNTER — Other Ambulatory Visit: Payer: Self-pay | Admitting: *Deleted

## 2017-03-16 ENCOUNTER — Ambulatory Visit (INDEPENDENT_AMBULATORY_CARE_PROVIDER_SITE_OTHER): Payer: Medicare Other | Admitting: Family Medicine

## 2017-03-16 ENCOUNTER — Encounter: Payer: Self-pay | Admitting: Family Medicine

## 2017-03-16 VITALS — BP 121/82 | HR 108 | Temp 97.3°F | Ht 66.0 in | Wt 177.0 lb

## 2017-03-16 DIAGNOSIS — Z0000 Encounter for general adult medical examination without abnormal findings: Secondary | ICD-10-CM

## 2017-03-16 DIAGNOSIS — N400 Enlarged prostate without lower urinary tract symptoms: Secondary | ICD-10-CM | POA: Diagnosis not present

## 2017-03-16 DIAGNOSIS — E7800 Pure hypercholesterolemia, unspecified: Secondary | ICD-10-CM

## 2017-03-16 DIAGNOSIS — E559 Vitamin D deficiency, unspecified: Secondary | ICD-10-CM | POA: Diagnosis not present

## 2017-03-16 DIAGNOSIS — M25562 Pain in left knee: Secondary | ICD-10-CM

## 2017-03-16 DIAGNOSIS — R52 Pain, unspecified: Secondary | ICD-10-CM

## 2017-03-16 DIAGNOSIS — I10 Essential (primary) hypertension: Secondary | ICD-10-CM | POA: Diagnosis not present

## 2017-03-16 DIAGNOSIS — L989 Disorder of the skin and subcutaneous tissue, unspecified: Secondary | ICD-10-CM

## 2017-03-16 DIAGNOSIS — M25561 Pain in right knee: Secondary | ICD-10-CM

## 2017-03-16 LAB — URINALYSIS, COMPLETE
BILIRUBIN UA: NEGATIVE
GLUCOSE, UA: NEGATIVE
KETONES UA: NEGATIVE
LEUKOCYTES UA: NEGATIVE
Nitrite, UA: NEGATIVE
Protein, UA: NEGATIVE
SPEC GRAV UA: 1.01 (ref 1.005–1.030)
Urobilinogen, Ur: 0.2 mg/dL (ref 0.2–1.0)
pH, UA: 7 (ref 5.0–7.5)

## 2017-03-16 LAB — MICROSCOPIC EXAMINATION
BACTERIA UA: NONE SEEN
Epithelial Cells (non renal): NONE SEEN /hpf (ref 0–10)
Renal Epithel, UA: NONE SEEN /hpf

## 2017-03-16 MED ORDER — HYDROCODONE-ACETAMINOPHEN 5-325 MG PO TABS: 1.0000 | ORAL_TABLET | Freq: Four times a day (QID) | ORAL | 0 refills | Status: DC | PRN

## 2017-03-16 MED ORDER — VENLAFAXINE HCL 37.5 MG PO TABS: 37.5000 mg | ORAL_TABLET | Freq: Every day | ORAL | 1 refills | Status: DC

## 2017-03-16 MED ORDER — LORAZEPAM 0.5 MG PO TABS: 0.5000 mg | ORAL_TABLET | Freq: Two times a day (BID) | ORAL | 4 refills | Status: DC | PRN

## 2017-03-16 NOTE — Patient Instructions (Signed)
Medicare Annual Wellness Visit  [facility] and the medical providers at [facility] strive to bring you the best medical care.  In doing so we not only want to address your current medical conditions and concerns but also to detect new conditions early and prevent illness, disease and health-related problems.    Medicare offers a yearly Wellness Visit which allows our clinical staff to assess your need for preventative services including immunizations, lifestyle education, counseling to decrease risk of preventable diseases and screening for fall risk and other medical concerns.    This visit is provided free of charge (no copay) for all Medicare recipients. The clinical pharmacists at [facility] have begun to conduct these Wellness Visits which will also include a thorough review of all your medications.    As you primary medical provider recommend that you make an appointment for your Annual Wellness Visit if you have not done so already this year.  You may set up this appointment before you leave today or you may call back [phone]) and schedule an appointment.  Please make sure when you call that you mention that you are scheduling your Annual Wellness Visit with the clinical pharmacist so that the appointment may be made for the proper length of time.     Continue current medications. Continue good therapeutic lifestyle changes which include good diet and exercise. Fall precautions discussed with patient. If an FOBT was given today- please return it to our front desk. If you are over 50 years old - you may need Prevnar 13 or the adult Pneumonia vaccine.  **Flu shots are available--- please call and schedule a FLU-CLINIC appointment**  After your visit with us today you will receive a survey in the mail or online from [employer] regarding your care with us. Please take a moment to fill this out. Your feedback is very  important to us as you can help us better understand your patient needs as well as improve your experience and satisfaction. WE CARE ABOUT YOU!!!    

## 2017-03-16 NOTE — Progress Notes (Signed)
Subjective:    Patient ID: Joshua Bowman, male    DOB: October 02, 1945, 71 y.o.   MRN: [id number]  HPI Patient is here today for annual wellness exam and follow up of chronic medical problems which includes hyperlipidemia and hypertension. He is taking medication regularly.The patient today does complain of some anxiety and increased tension. Also has a lesion on his right leg and he wants [place] to look at. He will get an EKG today. He is due to get lab work today and we will refill his pain medicine and lorazepam today. The blood pressures rechecked on several occasions and the final reading was 121/82. The patient does have generalized osteoarthritis with back pain and bilateral knee pain. He takes a pain medicine for this when needed. When he was working the pain was much worse. He also has underlying stress and anxiety and this is been worse since the death of his father over a year ago. The patient does have several issues today. He denies any chest pain or shortness of breath. He continues to hurt in his joints especially his knees and back and this creates a lot of problems when he is unable to be as active as he was like and even affects his emotional stability. He has never seen the orthopedic surgeon and we will arrange for him to see him when he comes to this office. He denies any problems with nausea vomiting or blood in the stool or black tarry bowel movements. He denies any change in his bowel habits but does have some constipation. He is not certain when his next colonoscopy is you but he did have polyps on the last 1 and this was apparently done and August 2013. We will check into this to make sure that this is arranged at an appropriate time. He's passing his water with without problems. He is interested in trying an antidepressant. We will check into if one of the newer was with lower side effects could help him.    Patient Active Problem List   Diagnosis Date Noted  . COPD (chronic  obstructive pulmonary disease) ([place]) 06/20/2016  . Coronary artery calcification 02/07/2015  . BPH (benign prostatic hyperplasia) 12/06/2013  . Metabolic syndrome 78/46/9629  . Carotid artery stenosis 12/06/2013  . Abnormal chest CT 08/17/2013  . Hypertension 03/30/2013  . Degenerative arthritis 03/30/2013  . Hyperlipidemia 03/30/2013  . Personal history of adenomatous colonic polyps 05/06/2012   Outpatient Encounter Prescriptions as of 03/16/2017  Medication Sig  . albuterol (PROAIR HFA) 108 (90 BASE) MCG/ACT inhaler INHALE ONE OR TWO PUFFS BY MOUTH FOUR TIMES DAILY AS NEEDED  . aspirin 325 MG tablet Take 325 mg by mouth daily.  . Cholecalciferol (VITAMIN D3) 5000 UNITS CAPS Take 1 tablet by mouth. 2-3 x week  . diclofenac sodium (VOLTAREN) 1 % GEL Apply 4 g topically 2 (two) times daily.  Marland Kitchen HYDROcodone-acetaminophen (NORCO/VICODIN) 5-325 MG tablet Take 1 tablet by mouth every 6 (six) hours as needed.  Marland Kitchen LORazepam (ATIVAN) 0.5 MG tablet TAKE 1 TABLET TWICE A DAY AS NEEDED  . losartan (COZAAR) 50 MG tablet TAKE 1 TABLET (50 MG TOTAL) BY MOUTH DAILY.  . rosuvastatin (CRESTOR) 20 MG tablet Take 1 tablet (20 mg total) by mouth daily.  . SYMBICORT 160-4.5 MCG/ACT inhaler INHALE 2 PUFFS BY MOUTH INTO THE LUNGS TWICE A DAY (RINSE MOUTH AFTER EACH USE)  . ZETIA 10 MG tablet TAKE 0.5 TABLETS (5 MG TOTAL) BY MOUTH DAILY.  Marland Kitchen HYDROcodone-acetaminophen (NORCO/VICODIN)  5-325 MG tablet Take 1 tablet by mouth every 6 (six) hours as needed for moderate pain. (Patient not taking: Reported on 03/16/2017)  . HYDROcodone-acetaminophen (NORCO/VICODIN) 5-325 MG tablet Take 1 tablet by mouth every 6 (six) hours as needed for moderate pain. (Patient not taking: Reported on 03/16/2017)   No facility-administered encounter medications on file as of 03/16/2017.       Review of Systems  Constitutional: Negative.   HENT: Negative.   Eyes: Negative.   Respiratory: Negative.   Cardiovascular: Negative.     Gastrointestinal: Negative.   Endocrine: Negative.   Genitourinary: Negative.   Musculoskeletal: Negative.   Skin: Negative.        Right knee skin lesion  Allergic/Immunologic: Negative.   Neurological: Negative.   Hematological: Negative.   Psychiatric/Behavioral: The patient is nervous/anxious (anxiety at times "tense").        Objective:   Physical Exam  Constitutional: He is oriented to person, place, and time. He appears well-developed and well-nourished. No distress.  The patient is pleasant and alert complaining with his back and knees.  HENT:  Head: Normocephalic and atraumatic.  Right Ear: External ear normal.  Left Ear: External ear normal.  Nose: Nose normal.  Mouth/Throat: Oropharynx is clear and moist. No oropharyngeal exudate.  Nasal congestion bilaterally  Eyes: Conjunctivae and EOM are normal. Pupils are equal, round, and reactive to light. Right eye exhibits no discharge. Left eye exhibits no discharge. No scleral icterus.  Patient needs eye exam.  Neck: Normal range of motion. Neck supple. No thyromegaly present.  No bruits thyromegaly or anterior cervical adenopathy  Cardiovascular: Normal rate, regular rhythm, normal heart sounds and intact distal pulses.   No murmur heard. The heart is regular at 84/m  Pulmonary/Chest: Effort normal and breath sounds normal. No respiratory distress. He has no wheezes. He has no rales. He exhibits no tenderness.  Clear anteriorly and posteriorly and no axillary adenopathy  Abdominal: Soft. Bowel sounds are normal. He exhibits no mass. There is no tenderness. There is no rebound and no guarding.  No abdominal tenderness or organ enlargement bruits or inguinal adenopathy  Genitourinary: Rectum normal and penis normal.  Genitourinary Comments: The prostate is enlarged. There are no lumps or masses. There were no rectal masses. External genitalia were within normal limits with no inguinal hernias being palpated.   Musculoskeletal: He exhibits tenderness and deformity. He exhibits no edema.  Stiffness in both knees with medial joint line tenderness. DIP joint swelling in fingers of both hands. Bunions in both great toes with left being worse than the right.  Lymphadenopathy:    He has no cervical adenopathy.  Neurological: He is alert and oriented to person, place, and time. He has normal reflexes. No cranial nerve deficit.  Skin: Skin is warm and dry. No rash noted.  Psychiatric: He has a normal mood and affect. His behavior is normal. Judgment and thought content normal.  Nursing note and vitals reviewed.  BP (!) 161/85 (BP Location: Left Arm)   Pulse (!) 108   Temp 97.3 F (36.3 C) (Oral)   Ht _0  (1.676 m)   Wt 177 lb (80.3 kg)   BMI 28.57 kg/m    EKG with results pending     Assessment & Plan:  1. Annual physical exam - BMP8+EGFR - CBC with Differential/Platelet - Hepatic function panel - VITAMIN D 25 Hydroxy (Vit-D Deficiency, Fractures) - PSA, total and free - NMR, lipoprofile - Urinalysis, Complete - EKG 12-Lead  2.  Pure hypercholesterolemia -Continue with current treatment pending results of lab work - CBC with Differential/Platelet - NMR, lipoprofile  3. Essential hypertension -Repeat blood pressure was good today. - BMP8+EGFR - CBC with Differential/Platelet - Hepatic function panel  4. Vitamin D deficiency -Continue with current treatment pending results of lab work - CBC with Differential/Platelet - VITAMIN D 25 Hydroxy (Vit-D Deficiency, Fractures)  5. Benign prostatic hyperplasia, unspecified whether lower urinary tract symptoms present -The prostate is enlarged but the patient is having no symptoms with voiding that he described to me today. - CBC with Differential/Platelet - PSA, total and free - Urinalysis, Complete  6. Pain management -The pain the patient is having is in his knees and back and this is all osteoarthritis and arthralgia and  work-related from the past. - CBC with Differential/Platelet  7. Pain in both knees, unspecified chronicity - Ambulatory referral to Orthopedic Surgery   Meds ordered this encounter  Medications  . LORazepam (ATIVAN) 0.5 MG tablet    Sig: Take 1 tablet (0.5 mg total) by mouth 2 (two) times daily as needed.    Dispense:  60 tablet    Refill:  4    Not to exceed 4 additional fills before 06/06/2017  . HYDROcodone-acetaminophen (NORCO/VICODIN) 5-325 MG tablet    Sig: Take 1 tablet by mouth every 6 (six) hours as needed.    Dispense:  120 tablet    Refill:  0  . HYDROcodone-acetaminophen (NORCO/VICODIN) 5-325 MG tablet    Sig: Take 1 tablet by mouth every 6 (six) hours as needed for moderate pain.    Dispense:  120 tablet    Refill:  0    DO NOT FILL UNTIL 04/14/17  . HYDROcodone-acetaminophen (NORCO/VICODIN) 5-325 MG tablet    Sig: Take 1 tablet by mouth every 6 (six) hours as needed for moderate pain.    Dispense:  120 tablet    Refill:  0    DO NOT FILL UNTIL 05/14/17  . venlafaxine (EFFEXOR) 37.5 MG tablet    Sig: Take 1 tablet (37.5 mg total) by mouth daily.    Dispense:  30 tablet    Refill:  1   Patient Instructions                       Medicare Annual Wellness Visit  [facility] and the medical providers at [facility] strive to bring you the best medical care.  In doing so we not only want to address your current medical conditions and concerns but also to detect new conditions early and prevent illness, disease and health-related problems.    Medicare offers a yearly Wellness Visit which allows our clinical staff to assess your need for preventative services including immunizations, lifestyle education, counseling to decrease risk of preventable diseases and screening for fall risk and other medical concerns.    This visit is provided free of charge (no copay) for all Medicare recipients. The clinical pharmacists at [facility] have begun to conduct these Wellness Visits which will also include a thorough review of all your medications.    As you primary medical provider recommend that you make an appointment for your Annual Wellness Visit if you have not done so already this year.  You may set up this appointment before you leave today or you may call back ([phone]) and schedule an appointment.  Please make sure when you call that you mention that you are scheduling your Annual  Wellness Visit with the clinical pharmacist so that the appointment may be made for the proper length of time.    Continue current medications. Continue good therapeutic lifestyle changes which include good diet and exercise. Fall precautions discussed with patient. If an FOBT was given today- please return it to our front desk. If you are over 68 years old - you may need Prevnar 74 or the adult Pneumonia vaccine.  **Flu shots are available--- please call and schedule a FLU-CLINIC appointment**  After your visit with [place] today you will receive a survey in the mail or online from [employer] regarding your care with [place]. Please take a moment to fill this out. Your feedback is very important to [place] as you can help [place] better understand your patient needs as well as improve your experience and satisfaction. WE CARE ABOUT YOU!!!     Arrie Senate MD

## 2017-03-17 LAB — NMR, LIPOPROFILE
CHOLESTEROL: 130 mg/dL (ref 100–199)
HDL Cholesterol by NMR: 56 mg/dL (ref >39)
HDL Particle Number: 35.1 umol/L (ref >=30.5)
LDL PARTICLE NUMBER: 907 nmol/L (ref <1000)
LDL SIZE: 20.2 nm (ref >20.5)
LDL-C: 62 mg/dL (ref 0–99)
LP-IR Score: <25 (ref <=45)
Small LDL Particle Number: 536 nmol/L — ABNORMAL HIGH (ref <=527)
TRIGLYCERIDES BY NMR: 60 mg/dL (ref 0–149)

## 2017-03-17 LAB — CBC WITH DIFFERENTIAL/PLATELET
BASOS ABS: 0 10*3/uL (ref 0.0–0.2)
Basos: 0 %
EOS (ABSOLUTE): 0 10*3/uL (ref 0.0–0.4)
Eos: 0 %
Hematocrit: 52.5 % — ABNORMAL HIGH (ref 37.5–51.0)
Hemoglobin: 17.3 g/dL (ref 13.0–17.7)
IMMATURE GRANS (ABS): 0 10*3/uL (ref 0.0–0.1)
IMMATURE GRANULOCYTES: 0 %
Lymphocytes Absolute: 1.6 10*3/uL (ref 0.7–3.1)
Lymphs: 18 %
MCH: 31.4 pg (ref 26.6–33.0)
MCHC: 33 g/dL (ref 31.5–35.7)
MCV: 95 fL (ref 79–97)
MONOS ABS: 0.6 10*3/uL (ref 0.1–0.9)
Monocytes: 7 %
Neutrophils Absolute: 6.9 10*3/uL (ref 1.4–7.0)
Neutrophils: 75 %
PLATELETS: 234 10*3/uL (ref 150–379)
RBC: 5.51 x10E6/uL (ref 4.14–5.80)
RDW: 13.9 % (ref 12.3–15.4)
WBC: 9.2 10*3/uL (ref 3.4–10.8)

## 2017-03-17 LAB — HEPATIC FUNCTION PANEL
ALBUMIN: 4.3 g/dL (ref 3.5–4.8)
ALK PHOS: 48 IU/L (ref 39–117)
ALT: 11 IU/L (ref 0–44)
AST: 20 IU/L (ref 0–40)
BILIRUBIN, DIRECT: 0.09 mg/dL (ref 0.00–0.40)
Bilirubin Total: 0.3 mg/dL (ref 0.0–1.2)
TOTAL PROTEIN: 6.4 g/dL (ref 6.0–8.5)

## 2017-03-17 LAB — BMP8+EGFR
BUN/Creatinine Ratio: 12 (ref 10–24)
BUN: 13 mg/dL (ref 8–27)
CHLORIDE: 98 mmol/L (ref 96–106)
CO2: 23 mmol/L (ref 20–29)
Calcium: 10 mg/dL (ref 8.6–10.2)
Creatinine, Ser: 1.13 mg/dL (ref 0.76–1.27)
GFR calc Af Amer: 75 mL/min/{1.73_m2} (ref >59)
GFR calc non Af Amer: 65 mL/min/{1.73_m2} (ref >59)
GLUCOSE: 103 mg/dL — AB (ref 65–99)
POTASSIUM: 4.8 mmol/L (ref 3.5–5.2)
SODIUM: 138 mmol/L (ref 134–144)

## 2017-03-17 LAB — VITAMIN D 25 HYDROXY (VIT D DEFICIENCY, FRACTURES): Vit D, 25-Hydroxy: 39.6 ng/mL (ref 30.0–100.0)

## 2017-03-17 LAB — PSA, TOTAL AND FREE
PROSTATE SPECIFIC AG, SERUM: 2.3 ng/mL (ref 0.0–4.0)
PSA FREE PCT: 19.6 %
PSA, Free: 0.45 ng/mL

## 2017-05-13 DIAGNOSIS — C44722 Squamous cell carcinoma of skin of right lower limb, including hip: Secondary | ICD-10-CM | POA: Diagnosis not present

## 2017-05-28 ENCOUNTER — Ambulatory Visit (INDEPENDENT_AMBULATORY_CARE_PROVIDER_SITE_OTHER): Payer: Medicare Other

## 2017-05-28 ENCOUNTER — Other Ambulatory Visit: Payer: Self-pay | Admitting: Orthopedic Surgery

## 2017-05-28 ENCOUNTER — Other Ambulatory Visit (INDEPENDENT_AMBULATORY_CARE_PROVIDER_SITE_OTHER): Payer: Medicare Other

## 2017-05-28 DIAGNOSIS — M170 Bilateral primary osteoarthritis of knee: Secondary | ICD-10-CM | POA: Diagnosis not present

## 2017-05-28 DIAGNOSIS — M25562 Pain in left knee: Secondary | ICD-10-CM

## 2017-05-28 DIAGNOSIS — R52 Pain, unspecified: Secondary | ICD-10-CM

## 2017-05-28 DIAGNOSIS — M25561 Pain in right knee: Secondary | ICD-10-CM | POA: Diagnosis not present

## 2017-06-24 DIAGNOSIS — Z08 Encounter for follow-up examination after completed treatment for malignant neoplasm: Secondary | ICD-10-CM | POA: Diagnosis not present

## 2017-06-24 DIAGNOSIS — Z85828 Personal history of other malignant neoplasm of skin: Secondary | ICD-10-CM | POA: Diagnosis not present

## 2017-07-15 ENCOUNTER — Telehealth: Payer: Self-pay | Admitting: Family Medicine

## 2017-07-17 ENCOUNTER — Ambulatory Visit: Payer: Medicare Other | Admitting: Family Medicine

## 2017-08-04 ENCOUNTER — Encounter: Payer: Self-pay | Admitting: Family Medicine

## 2017-08-04 ENCOUNTER — Ambulatory Visit (INDEPENDENT_AMBULATORY_CARE_PROVIDER_SITE_OTHER): Payer: Medicare Other | Admitting: Family Medicine

## 2017-08-04 VITALS — BP 136/88 | HR 103 | Temp 96.6°F | Ht 66.0 in | Wt 176.0 lb

## 2017-08-04 DIAGNOSIS — M159 Polyosteoarthritis, unspecified: Secondary | ICD-10-CM

## 2017-08-04 DIAGNOSIS — Z23 Encounter for immunization: Secondary | ICD-10-CM

## 2017-08-04 DIAGNOSIS — J410 Simple chronic bronchitis: Secondary | ICD-10-CM | POA: Diagnosis not present

## 2017-08-04 DIAGNOSIS — F520 Hypoactive sexual desire disorder: Secondary | ICD-10-CM

## 2017-08-04 DIAGNOSIS — E559 Vitamin D deficiency, unspecified: Secondary | ICD-10-CM

## 2017-08-04 DIAGNOSIS — N5201 Erectile dysfunction due to arterial insufficiency: Secondary | ICD-10-CM | POA: Diagnosis not present

## 2017-08-04 DIAGNOSIS — N400 Enlarged prostate without lower urinary tract symptoms: Secondary | ICD-10-CM | POA: Diagnosis not present

## 2017-08-04 DIAGNOSIS — E7800 Pure hypercholesterolemia, unspecified: Secondary | ICD-10-CM

## 2017-08-04 DIAGNOSIS — I10 Essential (primary) hypertension: Secondary | ICD-10-CM | POA: Diagnosis not present

## 2017-08-04 DIAGNOSIS — M150 Primary generalized (osteo)arthritis: Secondary | ICD-10-CM

## 2017-08-04 MED ORDER — HYDROCODONE-ACETAMINOPHEN 5-325 MG PO TABS: 1.0000 | ORAL_TABLET | Freq: Four times a day (QID) | ORAL | 0 refills | Status: DC | PRN

## 2017-08-04 MED ORDER — LORAZEPAM 0.5 MG PO TABS: 0.5000 mg | ORAL_TABLET | Freq: Two times a day (BID) | ORAL | 5 refills | Status: DC | PRN

## 2017-08-04 MED ORDER — SILDENAFIL CITRATE 20 MG PO TABS: ORAL_TABLET | ORAL | 2 refills | Status: DC

## 2017-08-04 NOTE — Progress Notes (Signed)
Subjective:    Patient ID: Joshua Bowman, male    DOB: 1946/09/19, 71 y.o.   MRN: [id number]  HPI  Pt here for follow up and management of chronic medical problems which includes hyperlipidemia and hypertension. He is taking medication regularly.  Patient today complains of decreased sexual drive.  He also says that his anxiety is improved somewhat.  He is requesting refills on his pain medicine and his lorazepam.  He will get lab work today.  The blood pressure was elevated on both occasions today.  Home blood pressure readings are good.  They will be scanned into the record.  Patient continues to have his ongoing knee pain and shoulder pain.  He has seen the orthopedic specialist and says that the orthopedist says he can do the knee replacement anytime the patient gets ready.  The patient says he is not quite ready for that.  Patient denies any chest pain or shortness of breath other than occasional wheezes.  He denies any trouble with his stomach including nausea vomiting diarrhea but does have off and on constipation and usually takes a stool softener over-the-counter.  He does take his pain medicine sometimes 1 to and maybe 3 times a day if needed for severe pain.  He is passing his water without problems and has no burning pain or frequency.  He does have decreased sexual drive and desire and this is been going on for about 6 months.  We talked about alcohol playing a role of this and he does have an occasional beer or beers and I told him that that was a depressant and could make sexual function worse.  He says his ongoing anxiety is better.  His father died a couple years ago.  Home blood pressures were reviewed and they will be scanned into the record and they are good.  He has Symbicort at home but says he does not use this regularly and I encouraged him to use it regularly and to use his albuterol as a rescue inhaler if the Symbicort was not working.    Patient Active Problem List   Diagnosis Date Noted  . COPD (chronic obstructive pulmonary disease) ([place]) 06/20/2016  . Coronary artery calcification 02/07/2015  . BPH (benign prostatic hyperplasia) 12/06/2013  . Metabolic syndrome 74/16/3845  . Carotid artery stenosis 12/06/2013  . Abnormal chest CT 08/17/2013  . Hypertension 03/30/2013  . Degenerative arthritis 03/30/2013  . Hyperlipidemia 03/30/2013  . Personal history of adenomatous colonic polyps 05/06/2012   Outpatient Encounter Medications as of 08/04/2017  Medication Sig  . albuterol (PROAIR HFA) 108 (90 BASE) MCG/ACT inhaler INHALE ONE OR TWO PUFFS BY MOUTH FOUR TIMES DAILY AS NEEDED  . aspirin 325 MG tablet Take 325 mg by mouth daily.  . Cholecalciferol (VITAMIN D3) 5000 UNITS CAPS Take 1 tablet by mouth. 2-3 x week  . diclofenac sodium (VOLTAREN) 1 % GEL Apply 4 g topically 2 (two) times daily.  Marland Kitchen HYDROcodone-acetaminophen (NORCO/VICODIN) 5-325 MG tablet Take 1 tablet by mouth every 6 (six) hours as needed.  Marland Kitchen LORazepam (ATIVAN) 0.5 MG tablet Take 1 tablet (0.5 mg total) by mouth 2 (two) times daily as needed.  Marland Kitchen losartan (COZAAR) 50 MG tablet TAKE 1 TABLET (50 MG TOTAL) BY MOUTH DAILY.  . SYMBICORT 160-4.5 MCG/ACT inhaler INHALE 2 PUFFS BY MOUTH INTO THE LUNGS TWICE A DAY (RINSE MOUTH AFTER EACH USE)  . ZETIA 10 MG tablet TAKE 0.5 TABLETS (5 MG TOTAL) BY MOUTH DAILY.  Marland Kitchen HYDROcodone-acetaminophen (  NORCO/VICODIN) 5-325 MG tablet Take 1 tablet by mouth every 6 (six) hours as needed for moderate pain. (Patient not taking: Reported on 08/04/2017)  . HYDROcodone-acetaminophen (NORCO/VICODIN) 5-325 MG tablet Take 1 tablet by mouth every 6 (six) hours as needed for moderate pain. (Patient not taking: Reported on 08/04/2017)  . [DISCONTINUED] rosuvastatin (CRESTOR) 20 MG tablet Take 1 tablet (20 mg total) by mouth daily.  . [DISCONTINUED] venlafaxine (EFFEXOR) 37.5 MG tablet Take 1 tablet (37.5 mg total) by mouth daily.   No facility-administered encounter medications  on file as of 08/04/2017.       Review of Systems  Constitutional: Negative.   HENT: Negative.   Eyes: Negative.   Respiratory: Negative.   Cardiovascular: Negative.   Gastrointestinal: Negative.   Endocrine: Negative.   Genitourinary: Negative.        Decreased sex drive  Musculoskeletal: Negative.   Skin: Negative.   Allergic/Immunologic: Negative.   Neurological: Negative.   Hematological: Negative.   Psychiatric/Behavioral: The patient is nervous/anxious (some days --- is better than a year ago).        Objective:   Physical Exam  Constitutional: He is oriented to person, place, and time. He appears well-developed and well-nourished. No distress.  The patient is pleasant and alert and somewhat down  HENT:  Head: Normocephalic and atraumatic.  Right Ear: External ear normal.  Left Ear: External ear normal.  Nose: Nose normal.  Mouth/Throat: Oropharynx is clear and moist. No oropharyngeal exudate.  Eyes: Conjunctivae and EOM are normal. Pupils are equal, round, and reactive to light. Right eye exhibits no discharge. Left eye exhibits no discharge. No scleral icterus.  Neck: Normal range of motion. Neck supple. No thyromegaly present.  No bruits thyromegaly or anterior cervical adenopathy  Cardiovascular: Normal rate, regular rhythm, normal heart sounds and intact distal pulses.  No murmur heard. Heart has a regular rate and rhythm at 84/min  Pulmonary/Chest: Effort normal and breath sounds normal. No respiratory distress. He has no wheezes. He has no rales.  Slightly decreased breath sounds but no wheezes or rhonchi and no axillary adenopathy  Abdominal: Soft. Bowel sounds are normal. He exhibits no mass. There is no tenderness. There is no rebound and no guarding.  There is no liver or spleen enlargement no bruits no inguinal adenopathy and no masses  Genitourinary: Rectum normal.  Musculoskeletal: Normal range of motion. He exhibits deformity. He exhibits no edema.    The patient has a significant bunion on the left foot.  It appears that his left foot is deviated inward.  There is no joint line tenderness in the left knee.  Lymphadenopathy:    He has no cervical adenopathy.  Neurological: He is alert and oriented to person, place, and time. He has normal reflexes. No cranial nerve deficit.  Skin: Skin is warm and dry. No rash noted.  Psychiatric: He has a normal mood and affect. His behavior is normal. Judgment and thought content normal.  Nursing note and vitals reviewed.  BP (!) 183/99 (BP Location: Left Wrist)   Pulse (!) 103   Temp (!) 96.6 F (35.9 C) (Oral)   Ht '5\' 6"'$  (1.676 m)   Wt 176 lb (79.8 kg)   BMI 28.41 kg/m         Assessment & Plan:  1. Pure hypercholesterolemia -Continue with current treatment aggressive therapeutic lifestyle changes pending results of lab work - CBC with Differential/Platelet - Lipid panel  2. Essential hypertension -The patient's initial blood pressures in this  office always seem to be high but recheck and then later that day or more like the readings he gets from home.  Home readings will be scanned into the record. - BMP8+EGFR - CBC with Differential/Platelet - Hepatic function panel  3. Vitamin D deficiency -Continue vitamin D replacement pending results of lab work - CBC with Differential/Platelet - VITAMIN D 25 Hydroxy (Vit-D Deficiency, Fractures)  4. Benign prostatic hyperplasia, unspecified whether lower urinary tract symptoms present -No complaints with this today. - CBC with Differential/Platelet - Testosterone,Free and Total  5. Decreased sexual desire -We will check the testosterone level and give him a prescription for generic Viagra to try until he comes back to see [place] to review all the lab work. -He was reminded to decrease his alcohol intake. - Testosterone,Free and Total  6. Need for immunization against influenza - Flu Vaccine QUAD 36+ mos IM  7. Simple chronic bronchitis  ([place]) -The patient should use his Symbicort more regularly and use the albuterol as a rescue inhaler.  8. Erectile dysfunction due to arterial insufficiency -We will check testosterone levels -[facility] try if we have no samples  9. Primary osteoarthritis involving multiple joints -Continue to follow-up with orthopedic specialist.  Meds ordered this encounter  Medications  . HYDROcodone-acetaminophen (NORCO/VICODIN) 5-325 MG tablet    Sig: Take 1 tablet every 6 (six) hours as needed by mouth.    Dispense:  120 tablet    Refill:  0  . HYDROcodone-acetaminophen (NORCO/VICODIN) 5-325 MG tablet    Sig: Take 1 tablet every 6 (six) hours as needed by mouth for moderate pain.    Dispense:  120 tablet    Refill:  0    DO NOT FILL UNTIL 10/02/17  . HYDROcodone-acetaminophen (NORCO/VICODIN) 5-325 MG tablet    Sig: Take 1 tablet every 6 (six) hours as needed by mouth for moderate pain.    Dispense:  120 tablet    Refill:  0    DO NOT FILL UNTIL 09/02/17  . LORazepam (ATIVAN) 0.5 MG tablet    Sig: Take 1 tablet (0.5 mg total) 2 (two) times daily as needed by mouth.    Dispense:  60 tablet    Refill:  5    Not to exceed 4 additional fills before 06/06/2017  . sildenafil (REVATIO) 20 MG tablet    Sig: Take 2-5 tabs PRN Prior to sexual activity    Dispense:  50 tablet    Refill:  2   Patient Instructions                       Medicare Annual Wellness Visit  [facility] and the medical providers at [facility] strive to bring you the best medical care.  In doing so we not only want to address your current medical conditions and concerns but also to detect new conditions early and prevent illness, disease and health-related problems.    Medicare offers a yearly Wellness Visit which allows our clinical staff to assess your need for preventative services including immunizations, lifestyle education, counseling to decrease  risk of preventable diseases and screening for fall risk and other medical concerns.    This visit is provided free of charge (no copay) for all Medicare recipients. The clinical pharmacists at [facility] have begun to conduct these Wellness Visits which will also include a thorough review of all your medications.    As  you primary medical provider recommend that you make an appointment for your Annual Wellness Visit if you have not done so already this year.  You may set up this appointment before you leave today or you may call back ([phone]) and schedule an appointment.  Please make sure when you call that you mention that you are scheduling your Annual Wellness Visit with the clinical pharmacist so that the appointment may be made for the proper length of time.     Continue current medications. Continue good therapeutic lifestyle changes which include good diet and exercise. Fall precautions discussed with patient. If an FOBT was given today- please return it to our front desk. If you are over 22 years old - you may need Prevnar 40 or the adult Pneumonia vaccine.  **Flu shots are available--- please call and schedule a FLU-CLINIC appointment**  After your visit with [place] today you will receive a survey in the mail or online from [employer] regarding your care with [place]. Please take a moment to fill this out. Your feedback is very important to [place] as you can help [place] better understand your patient needs as well as improve your experience and satisfaction. WE CARE ABOUT YOU!!!   We will check your testosterone levels We will call in a prescription for you for generic Viagra he can take as many as 5 of these at one time or as little as one at one time To your next visit we will ask you to try the Viagra and we will call you with results of the lab work that we are doing today Remember that alcohol can cause you to be more depressed When you return to the clinic in follow-up  we may consider trying an antidepressant that does not cause weight gain or affect erectile dysfunction Take as little pain medicine as possible  Arrie Senate MD

## 2017-08-04 NOTE — Patient Instructions (Addendum)
Medicare Annual Wellness Visit  [facility] and the medical providers at [facility] strive to bring you the best medical care.  In doing so we not only want to address your current medical conditions and concerns but also to detect new conditions early and prevent illness, disease and health-related problems.    Medicare offers a yearly Wellness Visit which allows our clinical staff to assess your need for preventative services including immunizations, lifestyle education, counseling to decrease risk of preventable diseases and screening for fall risk and other medical concerns.    This visit is provided free of charge (no copay) for all Medicare recipients. The clinical pharmacists at [facility] have begun to conduct these Wellness Visits which will also include a thorough review of all your medications.    As you primary medical provider recommend that you make an appointment for your Annual Wellness Visit if you have not done so already this year.  You may set up this appointment before you leave today or you may call back ([phone]) and schedule an appointment.  Please make sure when you call that you mention that you are scheduling your Annual Wellness Visit with the clinical pharmacist so that the appointment may be made for the proper length of time.     Continue current medications. Continue good therapeutic lifestyle changes which include good diet and exercise. Fall precautions discussed with patient. If an FOBT was given today- please return it to our front desk. If you are over 44 years old - you may need Prevnar 22 or the adult Pneumonia vaccine.  **Flu shots are available--- please call and schedule a FLU-CLINIC appointment**  After your visit with [place] today you will receive a survey in the mail or online from [employer] regarding your care with [place]. Please take a moment to fill this out. Your feedback is very  important to [place] as you can help [place] better understand your patient needs as well as improve your experience and satisfaction. WE CARE ABOUT YOU!!!   We will check your testosterone levels We will call in a prescription for you for generic Viagra he can take as many as 5 of these at one time or as little as one at one time To your next visit we will ask you to try the Viagra and we will call you with results of the lab work that we are doing today Remember that alcohol can cause you to be more depressed When you return to the clinic in follow-up we may consider trying an antidepressant that does not cause weight gain or affect erectile dysfunction Take as little pain medicine as possible

## 2017-08-05 LAB — BMP8+EGFR
BUN/Creatinine Ratio: 12 (ref 10–24)
BUN: 12 mg/dL (ref 8–27)
CALCIUM: 9.6 mg/dL (ref 8.6–10.2)
CHLORIDE: 99 mmol/L (ref 96–106)
CO2: 22 mmol/L (ref 20–29)
Creatinine, Ser: 1.04 mg/dL (ref 0.76–1.27)
GFR calc non Af Amer: 72 mL/min/{1.73_m2} (ref >59)
GFR, EST AFRICAN AMERICAN: 83 mL/min/{1.73_m2} (ref >59)
Glucose: 98 mg/dL (ref 65–99)
POTASSIUM: 4.2 mmol/L (ref 3.5–5.2)
Sodium: 139 mmol/L (ref 134–144)

## 2017-08-05 LAB — HEPATIC FUNCTION PANEL
ALBUMIN: 4.4 g/dL (ref 3.5–4.8)
ALT: 11 IU/L (ref 0–44)
AST: 18 IU/L (ref 0–40)
Alkaline Phosphatase: 50 IU/L (ref 39–117)
BILIRUBIN TOTAL: 0.3 mg/dL (ref 0.0–1.2)
Bilirubin, Direct: 0.1 mg/dL (ref 0.00–0.40)
TOTAL PROTEIN: 6.8 g/dL (ref 6.0–8.5)

## 2017-08-05 LAB — CBC WITH DIFFERENTIAL/PLATELET
BASOS ABS: 0 10*3/uL (ref 0.0–0.2)
Basos: 0 %
EOS (ABSOLUTE): 0.1 10*3/uL (ref 0.0–0.4)
Eos: 1 %
Hematocrit: 51.3 % — ABNORMAL HIGH (ref 37.5–51.0)
Hemoglobin: 17.4 g/dL (ref 13.0–17.7)
IMMATURE GRANS (ABS): 0 10*3/uL (ref 0.0–0.1)
Immature Granulocytes: 0 %
LYMPHS ABS: 2.3 10*3/uL (ref 0.7–3.1)
LYMPHS: 25 %
MCH: 31.7 pg (ref 26.6–33.0)
MCHC: 33.9 g/dL (ref 31.5–35.7)
MCV: 93 fL (ref 79–97)
Monocytes Absolute: 0.7 10*3/uL (ref 0.1–0.9)
Monocytes: 7 %
NEUTROS ABS: 6.2 10*3/uL (ref 1.4–7.0)
Neutrophils: 67 %
Platelets: 237 10*3/uL (ref 150–379)
RBC: 5.49 x10E6/uL (ref 4.14–5.80)
RDW: 14 % (ref 12.3–15.4)
WBC: 9.3 10*3/uL (ref 3.4–10.8)

## 2017-08-05 LAB — LIPID PANEL
CHOLESTEROL TOTAL: 212 mg/dL — AB (ref 100–199)
Chol/HDL Ratio: 3.6 ratio (ref 0.0–5.0)
HDL: 59 mg/dL (ref >39)
LDL Calculated: 138 mg/dL — ABNORMAL HIGH (ref 0–99)
Triglycerides: 74 mg/dL (ref 0–149)
VLDL Cholesterol Cal: 15 mg/dL (ref 5–40)

## 2017-08-05 LAB — VITAMIN D 25 HYDROXY (VIT D DEFICIENCY, FRACTURES): VIT D 25 HYDROXY: 35.3 ng/mL (ref 30.0–100.0)

## 2017-08-05 LAB — TESTOSTERONE,FREE AND TOTAL
TESTOSTERONE FREE: 7.3 pg/mL (ref 6.6–18.1)
Testosterone: 444 ng/dL (ref 264–916)

## 2017-08-07 ENCOUNTER — Other Ambulatory Visit: Payer: Medicare Other

## 2017-08-07 DIAGNOSIS — Z1211 Encounter for screening for malignant neoplasm of colon: Secondary | ICD-10-CM

## 2017-08-11 ENCOUNTER — Other Ambulatory Visit: Payer: Self-pay | Admitting: *Deleted

## 2017-08-11 DIAGNOSIS — R195 Other fecal abnormalities: Secondary | ICD-10-CM

## 2017-08-11 LAB — FECAL OCCULT BLOOD, IMMUNOCHEMICAL: FECAL OCCULT BLD: POSITIVE — AB

## 2017-08-13 ENCOUNTER — Encounter: Payer: Self-pay | Admitting: Internal Medicine

## 2017-09-01 ENCOUNTER — Ambulatory Visit (INDEPENDENT_AMBULATORY_CARE_PROVIDER_SITE_OTHER): Payer: Medicare Other

## 2017-09-01 ENCOUNTER — Ambulatory Visit (INDEPENDENT_AMBULATORY_CARE_PROVIDER_SITE_OTHER): Payer: Medicare Other | Admitting: Family Medicine

## 2017-09-01 ENCOUNTER — Encounter: Payer: Self-pay | Admitting: Family Medicine

## 2017-09-01 ENCOUNTER — Other Ambulatory Visit: Payer: Self-pay | Admitting: Family Medicine

## 2017-09-01 VITALS — BP 139/96 | HR 110 | Temp 98.5°F | Ht 66.0 in | Wt 177.0 lb

## 2017-09-01 DIAGNOSIS — M5440 Lumbago with sciatica, unspecified side: Secondary | ICD-10-CM | POA: Diagnosis not present

## 2017-09-01 DIAGNOSIS — M150 Primary generalized (osteo)arthritis: Secondary | ICD-10-CM

## 2017-09-01 DIAGNOSIS — I10 Essential (primary) hypertension: Secondary | ICD-10-CM

## 2017-09-01 DIAGNOSIS — F418 Other specified anxiety disorders: Secondary | ICD-10-CM | POA: Diagnosis not present

## 2017-09-01 DIAGNOSIS — R6882 Decreased libido: Secondary | ICD-10-CM | POA: Diagnosis not present

## 2017-09-01 DIAGNOSIS — M545 Low back pain: Secondary | ICD-10-CM | POA: Diagnosis not present

## 2017-09-01 DIAGNOSIS — M159 Polyosteoarthritis, unspecified: Secondary | ICD-10-CM

## 2017-09-01 MED ORDER — PREDNISONE 10 MG PO TABS: ORAL_TABLET | ORAL | 0 refills | Status: DC

## 2017-09-01 NOTE — Patient Instructions (Signed)
Take prednisone as directed As this is being completed to try a generic Viagra as discussed At about the same time after trying the Viagra go ahead and start the trintellix, the antidepressant that we discussed during the visit today. Continue to drink plenty of fluids Decrease any alcohol consumption to a minimum. Please give me a call back in the next 4 weeks and let me know how everything is going at that time.

## 2017-09-01 NOTE — Progress Notes (Signed)
Subjective:    Patient ID: Joshua Bowman, male    DOB: 1946-06-03, 71 y.o.   MRN: [id number]  HPI Patient here today for 4 week follow up on testosterone labs and depression .  The patient did not try the generic Viagra.  He thinks his problems with sexual function are more secondary to a lack of desire.  The testosterone levels were within normal limits.  We will get one more reading on that today while he was here so that we had to to confirm that it is not a testosterone problem.  He did readdress the fact that he has had more trouble getting over his father's death but this seems to be leveling out some.  He had a discussion regarding his sexual function and drive with his wife and they both agree that it may not be as much physical as it is emotional.  The past few days so he has had an increased flare in his back pain and this seems to be the primary problem currently although his blood pressure was up today to which may be from the back pain.  Normally his blood pressures are good at home.  Discussed options.  We will check another testosterone level.  We will treat the back pain with a course of prednisone and an IM injection of Depo-Medrol.  After he finishes the prednisone he will try at least a low-dose of generic Viagra to see if this motivates him more to be more sexual.  After trying that we will also consider trying him on trend Telex for which we will give him samples today to take 1 daily.  A lot of the [employer] discuss these discussions.  He does not recall anything that causes back to get her at this time.  It has been a while since he has had some LS spine films and so we will go ahead and get LS spine films on the way out today.    Patient Active Problem List   Diagnosis Date Noted  . COPD (chronic obstructive pulmonary disease) ([place]) 06/20/2016  . Coronary artery calcification 02/07/2015  . BPH (benign prostatic hyperplasia) 12/06/2013  . Metabolic syndrome 40/98/1191   . Carotid artery stenosis 12/06/2013  . Abnormal chest CT 08/17/2013  . Hypertension 03/30/2013  . Degenerative arthritis 03/30/2013  . Hyperlipidemia 03/30/2013  . Personal history of adenomatous colonic polyps 05/06/2012   Outpatient Encounter Medications as of 09/01/2017  Medication Sig  . albuterol (PROAIR HFA) 108 (90 BASE) MCG/ACT inhaler INHALE ONE OR TWO PUFFS BY MOUTH FOUR TIMES DAILY AS NEEDED  . aspirin 325 MG tablet Take 325 mg by mouth daily.  . Cholecalciferol (VITAMIN D3) 5000 UNITS CAPS Take 1 tablet by mouth. 2-3 x week  . diclofenac sodium (VOLTAREN) 1 % GEL Apply 4 g topically 2 (two) times daily.  Marland Kitchen HYDROcodone-acetaminophen (NORCO/VICODIN) 5-325 MG tablet Take 1 tablet every 6 (six) hours as needed by mouth.  Marland Kitchen LORazepam (ATIVAN) 0.5 MG tablet Take 1 tablet (0.5 mg total) 2 (two) times daily as needed by mouth.  . losartan (COZAAR) 50 MG tablet TAKE 1 TABLET (50 MG TOTAL) BY MOUTH DAILY.  . SYMBICORT 160-4.5 MCG/ACT inhaler INHALE 2 PUFFS BY MOUTH INTO THE LUNGS TWICE A DAY (RINSE MOUTH AFTER EACH USE)  . ZETIA 10 MG tablet TAKE 0.5 TABLETS (5 MG TOTAL) BY MOUTH DAILY.  Marland Kitchen HYDROcodone-acetaminophen (NORCO/VICODIN) 5-325 MG tablet Take 1 tablet every 6 (six) hours as needed by mouth for  moderate pain. (Patient not taking: Reported on 09/01/2017)  . HYDROcodone-acetaminophen (NORCO/VICODIN) 5-325 MG tablet Take 1 tablet every 6 (six) hours as needed by mouth for moderate pain. (Patient not taking: Reported on 09/01/2017)  . sildenafil (REVATIO) 20 MG tablet Take 2-5 tabs PRN Prior to sexual activity (Patient not taking: Reported on 09/01/2017)   No facility-administered encounter medications on file as of 09/01/2017.       Review of Systems  Constitutional: Negative.   HENT: Negative.   Eyes: Negative.   Respiratory: Negative.   Cardiovascular: Negative.   Gastrointestinal: Negative.   Endocrine: Negative.   Genitourinary: Negative.   Musculoskeletal: Positive for  back pain.  Skin: Negative.   Allergic/Immunologic: Negative.   Neurological: Negative.   Hematological: Negative.   Psychiatric/Behavioral: Negative.        Objective:   Physical Exam  Constitutional: He is oriented to person, place, and time. He appears well-developed and well-nourished.  The patient is calm and pleasant and alert  HENT:  Head: Normocephalic and atraumatic.  Eyes: Conjunctivae and EOM are normal. Pupils are equal, round, and reactive to light. Right eye exhibits no discharge. Left eye exhibits no discharge. No scleral icterus.  Neck: Normal range of motion.  Cardiovascular: Normal rate, regular rhythm and normal heart sounds.  No murmur heard. Pulmonary/Chest: Effort normal and breath sounds normal. No respiratory distress. He has no wheezes. He has no rales.  Clear anteriorly and posteriorly  Abdominal: He exhibits no mass.  Musculoskeletal: He exhibits tenderness. He exhibits no edema.  Tenderness right parasacral area and pain with leg raising on the right side radiating down right leg.  Neurological: He is alert and oriented to person, place, and time. He has normal reflexes. No cranial nerve deficit.  Skin: Skin is warm and dry. No rash noted.  Psychiatric: He has a normal mood and affect. His behavior is normal. Judgment and thought content normal.  Nursing note and vitals reviewed.  BP (!) 135/94 (BP Location: Right Arm)   Pulse (!) 110   Temp 98.5 F (36.9 C) (Oral)   Ht 5\' 6"  (1.676 m)   Wt 177 lb (80.3 kg)   BMI 28.57 kg/m   Depo-Medrol 60 mg injected to point tender area of right lateral hip.  This was done after cleansing with Betadine and with no problems. LS spine films with results pending=== Repeat testosterone levels with result pending===     Assessment & Plan:  1. Acute right-sided low back pain with sciatica, sciatica laterality unspecified -Depo-Medrol plus prednisone taper over 12 days - DG Lumbar Spine 2-3 Views; Future  2.  Decreased libido -After finishing prednisone patient will try generic Viagra - Testosterone,Free and Total  3. Anxiety with depression -Continue with lorazepam as needed and add trindtellix 10 mg, when prednisone is finished  4. Primary osteoarthritis involving multiple joints -The patient does have a lot of arthritis especially in his knees.  5. Essential hypertension -The blood pressure is elevated on 2 occasions today and the patient will get some more readings at home.  He says the readings at home have been lower in the past but that the increased pain in his back has made him have more trouble with his blood pressure.  Meds ordered this encounter  Medications  . predniSONE (DELTASONE) 10 MG tablet    Sig: Take 1 tab QID x 3 days, then 1 tab TID x 3 days, then 1 tab BID x 3 days, then 1 tab QD x 3  days    Dispense:  30 tablet    Refill:  0   Patient Instructions  Take prednisone as directed As this is being completed to try a generic Viagra as discussed At about the same time after trying the Viagra go ahead and start the trintellix, the antidepressant that we discussed during the visit today. Continue to drink plenty of fluids Decrease any alcohol consumption to a minimum. Please give me a call back in the next 4 weeks and let me know how everything is going at that time.  Arrie Senate MD

## 2017-09-02 LAB — SPECIMEN STATUS

## 2017-09-02 LAB — TESTOSTERONE,FREE AND TOTAL
TESTOSTERONE FREE: 6.6 pg/mL (ref 6.6–18.1)
Testosterone: 488 ng/dL (ref 264–916)

## 2017-09-02 NOTE — Telephone Encounter (Signed)
Last seen 12/4 18 DWM  If approved route to nurse to call into [facility]

## 2017-09-15 ENCOUNTER — Telehealth: Payer: Self-pay | Admitting: Family Medicine

## 2017-09-15 DIAGNOSIS — M545 Low back pain: Secondary | ICD-10-CM

## 2017-09-15 NOTE — Telephone Encounter (Signed)
I really think that we should go ahead and get an MRI of his back.  He is already taking lorazepam and this has some of the same sedating qualities that Flexeril has.  I hate to add another sedating medicine to his medication list at this time.  Please get MRI of LS spine

## 2017-09-15 NOTE — Telephone Encounter (Signed)
Pt aware  - MRI ordered - prefers to go Thursday

## 2017-09-15 NOTE — Telephone Encounter (Signed)
The MRI it would be much more informative and we could therefore direct our treatment in a more specific way to get him to feeling better.

## 2017-09-15 NOTE — Telephone Encounter (Signed)
Patient finished Prednisone on Monday, seemed to help some, but he continues to have pains radiating into his leg and at night time he reports his back hurts badly.  He would like to know if you feel that Flexeril may would help at bedtime.

## 2017-09-16 DIAGNOSIS — M9905 Segmental and somatic dysfunction of pelvic region: Secondary | ICD-10-CM | POA: Diagnosis not present

## 2017-09-16 DIAGNOSIS — M5137 Other intervertebral disc degeneration, lumbosacral region: Secondary | ICD-10-CM | POA: Diagnosis not present

## 2017-09-16 DIAGNOSIS — M9904 Segmental and somatic dysfunction of sacral region: Secondary | ICD-10-CM | POA: Diagnosis not present

## 2017-09-16 DIAGNOSIS — M9903 Segmental and somatic dysfunction of lumbar region: Secondary | ICD-10-CM | POA: Diagnosis not present

## 2017-09-17 DIAGNOSIS — M9903 Segmental and somatic dysfunction of lumbar region: Secondary | ICD-10-CM | POA: Diagnosis not present

## 2017-09-17 DIAGNOSIS — M5137 Other intervertebral disc degeneration, lumbosacral region: Secondary | ICD-10-CM | POA: Diagnosis not present

## 2017-09-17 DIAGNOSIS — M9905 Segmental and somatic dysfunction of pelvic region: Secondary | ICD-10-CM | POA: Diagnosis not present

## 2017-09-17 DIAGNOSIS — M9904 Segmental and somatic dysfunction of sacral region: Secondary | ICD-10-CM | POA: Diagnosis not present

## 2017-09-21 DIAGNOSIS — M5137 Other intervertebral disc degeneration, lumbosacral region: Secondary | ICD-10-CM | POA: Diagnosis not present

## 2017-09-21 DIAGNOSIS — M9904 Segmental and somatic dysfunction of sacral region: Secondary | ICD-10-CM | POA: Diagnosis not present

## 2017-09-21 DIAGNOSIS — M9903 Segmental and somatic dysfunction of lumbar region: Secondary | ICD-10-CM | POA: Diagnosis not present

## 2017-09-21 DIAGNOSIS — M9905 Segmental and somatic dysfunction of pelvic region: Secondary | ICD-10-CM | POA: Diagnosis not present

## 2017-09-23 DIAGNOSIS — M5137 Other intervertebral disc degeneration, lumbosacral region: Secondary | ICD-10-CM | POA: Diagnosis not present

## 2017-09-23 DIAGNOSIS — M9905 Segmental and somatic dysfunction of pelvic region: Secondary | ICD-10-CM | POA: Diagnosis not present

## 2017-09-23 DIAGNOSIS — M9903 Segmental and somatic dysfunction of lumbar region: Secondary | ICD-10-CM | POA: Diagnosis not present

## 2017-09-23 DIAGNOSIS — M9904 Segmental and somatic dysfunction of sacral region: Secondary | ICD-10-CM | POA: Diagnosis not present

## 2017-09-24 DIAGNOSIS — M9904 Segmental and somatic dysfunction of sacral region: Secondary | ICD-10-CM | POA: Diagnosis not present

## 2017-09-24 DIAGNOSIS — M9903 Segmental and somatic dysfunction of lumbar region: Secondary | ICD-10-CM | POA: Diagnosis not present

## 2017-09-24 DIAGNOSIS — M9905 Segmental and somatic dysfunction of pelvic region: Secondary | ICD-10-CM | POA: Diagnosis not present

## 2017-09-24 DIAGNOSIS — M5137 Other intervertebral disc degeneration, lumbosacral region: Secondary | ICD-10-CM | POA: Diagnosis not present

## 2017-09-28 DIAGNOSIS — M9905 Segmental and somatic dysfunction of pelvic region: Secondary | ICD-10-CM | POA: Diagnosis not present

## 2017-09-28 DIAGNOSIS — M9904 Segmental and somatic dysfunction of sacral region: Secondary | ICD-10-CM | POA: Diagnosis not present

## 2017-09-28 DIAGNOSIS — M9903 Segmental and somatic dysfunction of lumbar region: Secondary | ICD-10-CM | POA: Diagnosis not present

## 2017-09-28 DIAGNOSIS — M5137 Other intervertebral disc degeneration, lumbosacral region: Secondary | ICD-10-CM | POA: Diagnosis not present

## 2017-09-30 DIAGNOSIS — M9904 Segmental and somatic dysfunction of sacral region: Secondary | ICD-10-CM | POA: Diagnosis not present

## 2017-09-30 DIAGNOSIS — M9905 Segmental and somatic dysfunction of pelvic region: Secondary | ICD-10-CM | POA: Diagnosis not present

## 2017-09-30 DIAGNOSIS — M5137 Other intervertebral disc degeneration, lumbosacral region: Secondary | ICD-10-CM | POA: Diagnosis not present

## 2017-09-30 DIAGNOSIS — M9903 Segmental and somatic dysfunction of lumbar region: Secondary | ICD-10-CM | POA: Diagnosis not present

## 2017-10-01 DIAGNOSIS — M5137 Other intervertebral disc degeneration, lumbosacral region: Secondary | ICD-10-CM | POA: Diagnosis not present

## 2017-10-01 DIAGNOSIS — M9903 Segmental and somatic dysfunction of lumbar region: Secondary | ICD-10-CM | POA: Diagnosis not present

## 2017-10-01 DIAGNOSIS — M9905 Segmental and somatic dysfunction of pelvic region: Secondary | ICD-10-CM | POA: Diagnosis not present

## 2017-10-01 DIAGNOSIS — M9904 Segmental and somatic dysfunction of sacral region: Secondary | ICD-10-CM | POA: Diagnosis not present

## 2017-10-02 ENCOUNTER — Ambulatory Visit (HOSPITAL_COMMUNITY): Payer: Medicare Other

## 2017-10-05 DIAGNOSIS — M9904 Segmental and somatic dysfunction of sacral region: Secondary | ICD-10-CM | POA: Diagnosis not present

## 2017-10-05 DIAGNOSIS — M9903 Segmental and somatic dysfunction of lumbar region: Secondary | ICD-10-CM | POA: Diagnosis not present

## 2017-10-05 DIAGNOSIS — M9905 Segmental and somatic dysfunction of pelvic region: Secondary | ICD-10-CM | POA: Diagnosis not present

## 2017-10-05 DIAGNOSIS — M5137 Other intervertebral disc degeneration, lumbosacral region: Secondary | ICD-10-CM | POA: Diagnosis not present

## 2017-10-07 DIAGNOSIS — M9903 Segmental and somatic dysfunction of lumbar region: Secondary | ICD-10-CM | POA: Diagnosis not present

## 2017-10-07 DIAGNOSIS — M9905 Segmental and somatic dysfunction of pelvic region: Secondary | ICD-10-CM | POA: Diagnosis not present

## 2017-10-07 DIAGNOSIS — M9904 Segmental and somatic dysfunction of sacral region: Secondary | ICD-10-CM | POA: Diagnosis not present

## 2017-10-07 DIAGNOSIS — M5137 Other intervertebral disc degeneration, lumbosacral region: Secondary | ICD-10-CM | POA: Diagnosis not present

## 2017-10-08 DIAGNOSIS — M5137 Other intervertebral disc degeneration, lumbosacral region: Secondary | ICD-10-CM | POA: Diagnosis not present

## 2017-10-08 DIAGNOSIS — M9903 Segmental and somatic dysfunction of lumbar region: Secondary | ICD-10-CM | POA: Diagnosis not present

## 2017-10-08 DIAGNOSIS — M9905 Segmental and somatic dysfunction of pelvic region: Secondary | ICD-10-CM | POA: Diagnosis not present

## 2017-10-08 DIAGNOSIS — M9904 Segmental and somatic dysfunction of sacral region: Secondary | ICD-10-CM | POA: Diagnosis not present

## 2017-10-12 DIAGNOSIS — M9903 Segmental and somatic dysfunction of lumbar region: Secondary | ICD-10-CM | POA: Diagnosis not present

## 2017-10-12 DIAGNOSIS — M9905 Segmental and somatic dysfunction of pelvic region: Secondary | ICD-10-CM | POA: Diagnosis not present

## 2017-10-12 DIAGNOSIS — M9904 Segmental and somatic dysfunction of sacral region: Secondary | ICD-10-CM | POA: Diagnosis not present

## 2017-10-12 DIAGNOSIS — M5137 Other intervertebral disc degeneration, lumbosacral region: Secondary | ICD-10-CM | POA: Diagnosis not present

## 2017-10-13 ENCOUNTER — Encounter: Payer: Self-pay | Admitting: Family Medicine

## 2017-10-13 ENCOUNTER — Ambulatory Visit (INDEPENDENT_AMBULATORY_CARE_PROVIDER_SITE_OTHER): Payer: Medicare Other | Admitting: Family Medicine

## 2017-10-13 VITALS — BP 144/102 | HR 117 | Temp 97.7°F | Ht 66.0 in | Wt 178.0 lb

## 2017-10-13 DIAGNOSIS — I10 Essential (primary) hypertension: Secondary | ICD-10-CM

## 2017-10-13 DIAGNOSIS — M159 Polyosteoarthritis, unspecified: Secondary | ICD-10-CM

## 2017-10-13 DIAGNOSIS — M150 Primary generalized (osteo)arthritis: Secondary | ICD-10-CM | POA: Diagnosis not present

## 2017-10-13 DIAGNOSIS — M545 Low back pain: Secondary | ICD-10-CM | POA: Diagnosis not present

## 2017-10-13 DIAGNOSIS — F520 Hypoactive sexual desire disorder: Secondary | ICD-10-CM | POA: Diagnosis not present

## 2017-10-13 DIAGNOSIS — F418 Other specified anxiety disorders: Secondary | ICD-10-CM | POA: Diagnosis not present

## 2017-10-13 MED ORDER — HYDROCODONE-ACETAMINOPHEN 5-325 MG PO TABS: 1.0000 | ORAL_TABLET | Freq: Four times a day (QID) | ORAL | 0 refills | Status: DC | PRN

## 2017-10-13 NOTE — Patient Instructions (Signed)
Continue to follow-up with chiropractor If pain gets worse, patient should get back in touch with [place] sooner to arrange for an MRI prior to his next visit. He should bring his blood pressure cuff to the next office visit so we can compare readings from home to here. Continue to watch sodium intake.

## 2017-10-13 NOTE — Progress Notes (Signed)
Subjective:    Patient ID: [name], male    DOB: 10-23-1945, 72 y.o.   MRN: [id number]  HPI Patient here today for 6 week follow up on depression, anxiety, back pain and meds.  The patient continues to have ongoing back pain depression testosterone deficiency.  He has been to see a chiropractor and this is helped his back some.  This is a 6-week recheck.  The blood pressure initially today was 176/88.  His blood pressure readings at home were good at 128/78.  He is currently not had an MRI.  Continues to take his hydrocodone for pain and lorazepam for muscle relaxation.  Patient is feeling better.  He would like to hold off on getting the MRI for the time being and continue with his visits to the chiropractor.  I totally agree with this.  This episode with back pain occurred for him shortly after shoveling snow.  He will continue to follow-up with a chiropractor.  His blood pressure readings once again were elevated and even a second time by me.     Patient Active Problem List   Diagnosis Date Noted  . COPD (chronic obstructive pulmonary disease) ([place]) 06/20/2016  . Coronary artery calcification 02/07/2015  . BPH (benign prostatic hyperplasia) 12/06/2013  . Metabolic syndrome 19/14/7829  . Carotid artery stenosis 12/06/2013  . Abnormal chest CT 08/17/2013  . Hypertension 03/30/2013  . Degenerative arthritis 03/30/2013  . Hyperlipidemia 03/30/2013  . Personal history of adenomatous colonic polyps 05/06/2012   Outpatient Encounter Medications as of 10/13/2017  Medication Sig  . albuterol (PROAIR HFA) 108 (90 BASE) MCG/ACT inhaler INHALE ONE OR TWO PUFFS BY MOUTH FOUR TIMES DAILY AS NEEDED  . aspirin 325 MG tablet Take 325 mg by mouth daily.  . Cholecalciferol (VITAMIN D3) 5000 UNITS CAPS Take 1 tablet by mouth. 2-3 x week  . diclofenac sodium (VOLTAREN) 1 % GEL Apply 4 g topically 2 (two) times daily.  Marland Kitchen HYDROcodone-acetaminophen (NORCO/VICODIN) 5-325 MG tablet Take 1 tablet every  6 (six) hours as needed by mouth.  Marland Kitchen HYDROcodone-acetaminophen (NORCO/VICODIN) 5-325 MG tablet Take 1 tablet every 6 (six) hours as needed by mouth for moderate pain.  Marland Kitchen HYDROcodone-acetaminophen (NORCO/VICODIN) 5-325 MG tablet Take 1 tablet every 6 (six) hours as needed by mouth for moderate pain.  Marland Kitchen LORazepam (ATIVAN) 0.5 MG tablet Take 1 tablet (0.5 mg total) 2 (two) times daily as needed by mouth.  . losartan (COZAAR) 50 MG tablet TAKE 1 TABLET (50 MG TOTAL) BY MOUTH DAILY.  . SYMBICORT 160-4.5 MCG/ACT inhaler INHALE 2 PUFFS BY MOUTH INTO THE LUNGS TWICE A DAY (RINSE MOUTH AFTER EACH USE)  . ZETIA 10 MG tablet TAKE 0.5 TABLETS (5 MG TOTAL) BY MOUTH DAILY.  . [DISCONTINUED] predniSONE (DELTASONE) 10 MG tablet Take 1 tab QID x 3 days, then 1 tab TID x 3 days, then 1 tab BID x 3 days, then 1 tab QD x 3 days  . [DISCONTINUED] sildenafil (REVATIO) 20 MG tablet Take 2-5 tabs PRN Prior to sexual activity (Patient not taking: Reported on 09/01/2017)   No facility-administered encounter medications on file as of 10/13/2017.       Review of Systems  Constitutional: Negative.   HENT: Negative.   Eyes: Negative.   Respiratory: Negative.   Cardiovascular: Negative.   Gastrointestinal: Negative.   Endocrine: Negative.   Genitourinary: Negative.   Musculoskeletal: Negative.   Skin: Negative.   Allergic/Immunologic: Negative.   Neurological: Negative.   Hematological: Negative.  Psychiatric/Behavioral: Negative.        Objective:   Physical Exam  Constitutional: He is oriented to person, place, and time. He appears well-developed and well-nourished.  The patient is pleasant and alert and does seem to be emotionally better.  He says his libido is also improved.  HENT:  Head: Normocephalic and atraumatic.  Eyes: Conjunctivae and EOM are normal. Pupils are equal, round, and reactive to light. Right eye exhibits no discharge. Left eye exhibits no discharge. No scleral icterus.  Neck: Normal  range of motion.  Cardiovascular: Exam reveals no gallop and no friction rub.  No murmur heard. Abdominal: Soft. Bowel sounds are normal. There is no tenderness. There is no rebound and no guarding.  Genitourinary: Rectum normal.  Musculoskeletal: Normal range of motion. He exhibits no edema.  Leg raising and hip abduction bilaterally were good with only some slight discomfort on the right side with leg raising against pressure.  Neurological: He is alert and oriented to person, place, and time. He has normal reflexes. No cranial nerve deficit.  Skin: Skin is warm and dry. No rash noted.  Psychiatric: He has a normal mood and affect. His behavior is normal. Judgment and thought content normal.  The patient's mood is more positive.  Nursing note and vitals reviewed.  BP (!) 176/88 (BP Location: Left Arm) Comment: 128 /78 at home this am  Pulse (!) 117   Temp 97.7 F (36.5 C) (Oral)   Ht 5\' 6"  (1.676 m)   Wt 178 lb (80.7 kg)   BMI 28.73 kg/m         Assessment & Plan:  1. Right low back pain, unspecified chronicity, with sciatica presence unspecified -This is improved compared to his last visit and he would like to refrain from doing any further x-rays at this time but will call us if the pain gets worse between now and the next visit  2. Anxiety with depression -With his improvement with pain his anxiety seems to be better and he can also hold off on starting the new antidepressant that we were planning to start.  3. Primary osteoarthritis involving multiple joints -Continue to follow up and follow through with chiropractic recommendations -Take pain medicine as needed and as directed and as little as possible  4. Decreased sexual desire -This has improved according to the patient especially with his improvement with pain. -We will continue to monitor this.  5.  Essential hypertension -The blood pressure was elevated on 2 occasions here in the office.  He says that his blood  pressure is always good at home" the reading of 128/78 at home today. -We will not make any changes in his medicines at this time but will ask him to bring his monitor to the next visit sometime in early March. -He will continue to watch his sodium intake.  Meds ordered this encounter  Medications  . HYDROcodone-acetaminophen (NORCO/VICODIN) 5-325 MG tablet    Sig: Take 1 tablet by mouth every 6 (six) hours as needed for moderate pain.    Dispense:  120 tablet    Refill:  0    DO NOT FILL UNTIL 10/02/17   Patient Instructions  Continue to follow-up with chiropractor If pain gets worse, patient should get back in touch with [place] sooner to arrange for an MRI prior to his next visit. He should bring his blood pressure cuff to the next office visit so we can compare readings from home to here. Continue to watch sodium intake.  Arrie Senate MD

## 2017-10-14 DIAGNOSIS — M9905 Segmental and somatic dysfunction of pelvic region: Secondary | ICD-10-CM | POA: Diagnosis not present

## 2017-10-14 DIAGNOSIS — M5137 Other intervertebral disc degeneration, lumbosacral region: Secondary | ICD-10-CM | POA: Diagnosis not present

## 2017-10-14 DIAGNOSIS — M9904 Segmental and somatic dysfunction of sacral region: Secondary | ICD-10-CM | POA: Diagnosis not present

## 2017-10-14 DIAGNOSIS — M9903 Segmental and somatic dysfunction of lumbar region: Secondary | ICD-10-CM | POA: Diagnosis not present

## 2017-10-15 ENCOUNTER — Ambulatory Visit: Payer: Medicare Other | Admitting: Internal Medicine

## 2017-10-15 ENCOUNTER — Encounter: Payer: Self-pay | Admitting: Internal Medicine

## 2017-10-15 VITALS — BP 166/90 | HR 103 | Ht 66.0 in | Wt 176.2 lb

## 2017-10-15 DIAGNOSIS — M9903 Segmental and somatic dysfunction of lumbar region: Secondary | ICD-10-CM | POA: Diagnosis not present

## 2017-10-15 DIAGNOSIS — R195 Other fecal abnormalities: Secondary | ICD-10-CM | POA: Diagnosis not present

## 2017-10-15 DIAGNOSIS — Z86010 Personal history of colonic polyps: Secondary | ICD-10-CM | POA: Diagnosis not present

## 2017-10-15 DIAGNOSIS — M5137 Other intervertebral disc degeneration, lumbosacral region: Secondary | ICD-10-CM | POA: Diagnosis not present

## 2017-10-15 DIAGNOSIS — M9904 Segmental and somatic dysfunction of sacral region: Secondary | ICD-10-CM | POA: Diagnosis not present

## 2017-10-15 DIAGNOSIS — M9905 Segmental and somatic dysfunction of pelvic region: Secondary | ICD-10-CM | POA: Diagnosis not present

## 2017-10-15 DIAGNOSIS — F419 Anxiety disorder, unspecified: Secondary | ICD-10-CM | POA: Diagnosis not present

## 2017-10-15 DIAGNOSIS — K5900 Constipation, unspecified: Secondary | ICD-10-CM | POA: Diagnosis not present

## 2017-10-15 DIAGNOSIS — R351 Nocturia: Secondary | ICD-10-CM | POA: Diagnosis not present

## 2017-10-15 NOTE — Progress Notes (Signed)
Joshua Bowman 72 y.o. 21-Feb-1946 [id number]  Assessment & Plan:   Encounter Diagnoses  Name Primary?  . Heme + stool Yes  . Hx of adenomatous colonic polyps   . Constipation, unspecified constipation type   . Anxiety   . Nocturia    1. Heme positive stool/hx of polyps- Pt will be scheduled for colonoscopy. He expressed some anxiety about getting colonoscopy and prepping for it, he was re-assured and counseled today. The risks and benefits as well as alternatives of endoscopic procedure(s) have been discussed and reviewed. All questions answered. The patient agrees to proceed.  2. Constipation- continue dulcolax as needed, likely d/t norco/vicodin. 3. Anxiety- Pt is being followed by PCP, managed on ativan.  4. Nocturia- followed by PCP, recommended pt to consult PCP about Urology referral given sxs and fhx of prostate CA.  I have personally seen the patient, reviewed and repeated key elements of the history and physical and participated in formation of the assessment and plan the student has documented.  Gatha Mayer, MD, [facility];Moore, Estella Husk, MD   Subjective:   Chief Complaint: Blood in stool  HPI Pt is a 72 yo, very nice Caucasian male presenting today with wife for GI consult and colonoscopy. He was referred by his PCP Dr. Redge Gainer, following a positive heme card in office. Labs do not show anemia or other relevant concerning findings.  Pt has not noticed any frank blood in stool,he has occasional constipation for which he takes dulcolax when needed. He usually has a bowel movement once a day without straining. He is overdue for a colonoscopy, last colonoscopy was in 2006 and found a few adenomatous polyps. No changes in appetite, hematochezia, heartburn, diarrhea, nausea, or other concerning sxs. Reports nocturia, 3-4 times per night, PCP noted enlarged prostate on PE.  Allergies  Allergen Reactions  . Lipitor [Atorvastatin] Other (See Comments)    myalgias    . Sulfa Antibiotics Itching and Rash   Current Meds  Medication Sig  . albuterol (PROAIR HFA) 108 (90 BASE) MCG/ACT inhaler INHALE ONE OR TWO PUFFS BY MOUTH FOUR TIMES DAILY AS NEEDED  . aspirin 325 MG tablet Take 325 mg by mouth daily.  . Cholecalciferol (VITAMIN D3) 5000 UNITS CAPS Take 1 tablet by mouth. 2-3 x week  . diclofenac sodium (VOLTAREN) 1 % GEL Apply 4 g topically 2 (two) times daily.  Marland Kitchen HYDROcodone-acetaminophen (NORCO/VICODIN) 5-325 MG tablet Take 1 tablet by mouth every 6 (six) hours as needed for moderate pain.  Marland Kitchen LORazepam (ATIVAN) 0.5 MG tablet Take 1 tablet (0.5 mg total) 2 (two) times daily as needed by mouth.  . losartan (COZAAR) 50 MG tablet TAKE 1 TABLET (50 MG TOTAL) BY MOUTH DAILY.  . SYMBICORT 160-4.5 MCG/ACT inhaler INHALE 2 PUFFS BY MOUTH INTO THE LUNGS TWICE A DAY (RINSE MOUTH AFTER EACH USE)  . ZETIA 10 MG tablet TAKE 0.5 TABLETS (5 MG TOTAL) BY MOUTH DAILY.   Past Medical History:  Diagnosis Date  . Anxiety   . Arthritis   . Bilateral carotid artery stenosis    Minimal 2012   . Colon polyps    multiple   . COPD (chronic obstructive pulmonary disease) ([place])   . Duodenitis   . Gastritis   . Hemorrhoids   . Hyperlipidemia   . Hyperplasia of prostate   . IBS (irritable bowel syndrome)   . Pre-diabetes   . Sigmoid diverticulosis    mild    Past Surgical History:  Procedure Laterality Date  . APPENDECTOMY  1969  . COLONOSCOPY    . ESOPHAGOGASTRODUODENOSCOPY    . lipoma rt shoulder  01/2001   outpatient   . nasal surgery ( spurs )  70's  . rt. shoulder -rotator cuff repair  08/2005   Dr. Maxie Better    Social History   Social History Narrative   Retired from a nursing home.  Lives with second wife.    family history includes Heart disease in his mother; Prostate cancer in his brother.  Review of Systems Positive for constipation, anxiety/depression, sleeping problems, nocturia. Negative for all other symptoms.   Objective:    Physical  Exam @BP  (!) 166/90   Pulse (!) 103   Ht 5\' 6"  (1.676 m)   Wt 176 lb 3.2 oz (79.9 kg)   SpO2 96%   BMI 28.44 kg/m @  General:  Well-developed, well-nourished and in no acute distress Eyes:  anicteric. ENT:   Mouth and posterior pharynx free of lesions.  Neck:   supple w/o thyromegaly or mass.  Lungs: Clear to auscultation bilaterally. Heart:  S1S2, no rubs, murmurs, gallops. Abdomen:  soft, non-tender, no hepatosplenomegaly, hernia, or mass and BS+.  Lymph:  no cervical or supraclavicular adenopathy. Neuro:  A&O x 3.  Psych:  appropriate mood and  Affect.  Rectal deferred until colonoscopy  Data Reviewed: Prior notes, PMH, meds, labs, imaging- colonoscopy findings.

## 2017-10-15 NOTE — Patient Instructions (Signed)
You have been scheduled for a colonoscopy. Please follow written instructions given to you at your visit today.  Please pick up your prep supplies at the pharmacy. If you use inhalers (even only as needed), please bring them with you on the day of your procedure.   I appreciate the opportunity to care for you. Carl Gessner, MD, FACG 

## 2017-10-19 DIAGNOSIS — M9903 Segmental and somatic dysfunction of lumbar region: Secondary | ICD-10-CM | POA: Diagnosis not present

## 2017-10-19 DIAGNOSIS — M5137 Other intervertebral disc degeneration, lumbosacral region: Secondary | ICD-10-CM | POA: Diagnosis not present

## 2017-10-19 DIAGNOSIS — M9905 Segmental and somatic dysfunction of pelvic region: Secondary | ICD-10-CM | POA: Diagnosis not present

## 2017-10-19 DIAGNOSIS — M9904 Segmental and somatic dysfunction of sacral region: Secondary | ICD-10-CM | POA: Diagnosis not present

## 2017-10-21 DIAGNOSIS — M9905 Segmental and somatic dysfunction of pelvic region: Secondary | ICD-10-CM | POA: Diagnosis not present

## 2017-10-21 DIAGNOSIS — M5137 Other intervertebral disc degeneration, lumbosacral region: Secondary | ICD-10-CM | POA: Diagnosis not present

## 2017-10-21 DIAGNOSIS — M9903 Segmental and somatic dysfunction of lumbar region: Secondary | ICD-10-CM | POA: Diagnosis not present

## 2017-10-21 DIAGNOSIS — M9904 Segmental and somatic dysfunction of sacral region: Secondary | ICD-10-CM | POA: Diagnosis not present

## 2017-10-22 DIAGNOSIS — M9903 Segmental and somatic dysfunction of lumbar region: Secondary | ICD-10-CM | POA: Diagnosis not present

## 2017-10-22 DIAGNOSIS — M9905 Segmental and somatic dysfunction of pelvic region: Secondary | ICD-10-CM | POA: Diagnosis not present

## 2017-10-22 DIAGNOSIS — M5137 Other intervertebral disc degeneration, lumbosacral region: Secondary | ICD-10-CM | POA: Diagnosis not present

## 2017-10-22 DIAGNOSIS — M9904 Segmental and somatic dysfunction of sacral region: Secondary | ICD-10-CM | POA: Diagnosis not present

## 2017-10-26 DIAGNOSIS — M9904 Segmental and somatic dysfunction of sacral region: Secondary | ICD-10-CM | POA: Diagnosis not present

## 2017-10-26 DIAGNOSIS — M5137 Other intervertebral disc degeneration, lumbosacral region: Secondary | ICD-10-CM | POA: Diagnosis not present

## 2017-10-26 DIAGNOSIS — M9903 Segmental and somatic dysfunction of lumbar region: Secondary | ICD-10-CM | POA: Diagnosis not present

## 2017-10-26 DIAGNOSIS — M9905 Segmental and somatic dysfunction of pelvic region: Secondary | ICD-10-CM | POA: Diagnosis not present

## 2017-10-29 DIAGNOSIS — M9904 Segmental and somatic dysfunction of sacral region: Secondary | ICD-10-CM | POA: Diagnosis not present

## 2017-10-29 DIAGNOSIS — M9905 Segmental and somatic dysfunction of pelvic region: Secondary | ICD-10-CM | POA: Diagnosis not present

## 2017-10-29 DIAGNOSIS — M9903 Segmental and somatic dysfunction of lumbar region: Secondary | ICD-10-CM | POA: Diagnosis not present

## 2017-10-29 DIAGNOSIS — M5137 Other intervertebral disc degeneration, lumbosacral region: Secondary | ICD-10-CM | POA: Diagnosis not present

## 2017-11-02 DIAGNOSIS — M5137 Other intervertebral disc degeneration, lumbosacral region: Secondary | ICD-10-CM | POA: Diagnosis not present

## 2017-11-02 DIAGNOSIS — M9903 Segmental and somatic dysfunction of lumbar region: Secondary | ICD-10-CM | POA: Diagnosis not present

## 2017-11-02 DIAGNOSIS — M9904 Segmental and somatic dysfunction of sacral region: Secondary | ICD-10-CM | POA: Diagnosis not present

## 2017-11-02 DIAGNOSIS — M9905 Segmental and somatic dysfunction of pelvic region: Secondary | ICD-10-CM | POA: Diagnosis not present

## 2017-11-05 ENCOUNTER — Encounter: Payer: Self-pay | Admitting: Internal Medicine

## 2017-11-05 DIAGNOSIS — M5137 Other intervertebral disc degeneration, lumbosacral region: Secondary | ICD-10-CM | POA: Diagnosis not present

## 2017-11-05 DIAGNOSIS — M9905 Segmental and somatic dysfunction of pelvic region: Secondary | ICD-10-CM | POA: Diagnosis not present

## 2017-11-05 DIAGNOSIS — M9903 Segmental and somatic dysfunction of lumbar region: Secondary | ICD-10-CM | POA: Diagnosis not present

## 2017-11-05 DIAGNOSIS — M9904 Segmental and somatic dysfunction of sacral region: Secondary | ICD-10-CM | POA: Diagnosis not present

## 2017-11-11 DIAGNOSIS — M9905 Segmental and somatic dysfunction of pelvic region: Secondary | ICD-10-CM | POA: Diagnosis not present

## 2017-11-11 DIAGNOSIS — M5137 Other intervertebral disc degeneration, lumbosacral region: Secondary | ICD-10-CM | POA: Diagnosis not present

## 2017-11-11 DIAGNOSIS — M9903 Segmental and somatic dysfunction of lumbar region: Secondary | ICD-10-CM | POA: Diagnosis not present

## 2017-11-11 DIAGNOSIS — M9904 Segmental and somatic dysfunction of sacral region: Secondary | ICD-10-CM | POA: Diagnosis not present

## 2017-11-18 DIAGNOSIS — M9904 Segmental and somatic dysfunction of sacral region: Secondary | ICD-10-CM | POA: Diagnosis not present

## 2017-11-18 DIAGNOSIS — M5137 Other intervertebral disc degeneration, lumbosacral region: Secondary | ICD-10-CM | POA: Diagnosis not present

## 2017-11-18 DIAGNOSIS — M9905 Segmental and somatic dysfunction of pelvic region: Secondary | ICD-10-CM | POA: Diagnosis not present

## 2017-11-18 DIAGNOSIS — M9903 Segmental and somatic dysfunction of lumbar region: Secondary | ICD-10-CM | POA: Diagnosis not present

## 2017-11-19 ENCOUNTER — Ambulatory Visit (AMBULATORY_SURGERY_CENTER): Payer: Medicare Other | Admitting: Internal Medicine

## 2017-11-19 ENCOUNTER — Other Ambulatory Visit: Payer: Self-pay

## 2017-11-19 VITALS — BP 124/58 | HR 82 | Temp 98.6°F | Resp 9 | Ht 66.0 in | Wt 176.0 lb

## 2017-11-19 DIAGNOSIS — Z860100 Personal history of colon polyps, unspecified: Secondary | ICD-10-CM

## 2017-11-19 DIAGNOSIS — D124 Benign neoplasm of descending colon: Secondary | ICD-10-CM

## 2017-11-19 DIAGNOSIS — D123 Benign neoplasm of transverse colon: Secondary | ICD-10-CM | POA: Diagnosis not present

## 2017-11-19 DIAGNOSIS — R195 Other fecal abnormalities: Secondary | ICD-10-CM | POA: Diagnosis present

## 2017-11-19 DIAGNOSIS — D128 Benign neoplasm of rectum: Secondary | ICD-10-CM | POA: Diagnosis not present

## 2017-11-19 DIAGNOSIS — D125 Benign neoplasm of sigmoid colon: Secondary | ICD-10-CM

## 2017-11-19 MED ORDER — SODIUM CHLORIDE 0.9 % IV SOLN: 500.0000 mL | Freq: Once | INTRAVENOUS | Status: DC

## 2017-11-19 NOTE — Patient Instructions (Addendum)
I found and removed 10 polyps - all look benign.  I will let you know pathology results and when to have another routine colonoscopy by mail and/or My Chart.  I appreciate the opportunity to care for you. Gatha Mayer, MD, [facility]  ** Handouts given on polyps and diverticulosis ** AVOID ASPIRIN AND OTHER NSAIDS FOR TWO WEEKS!   YOU HAD AN ENDOSCOPIC PROCEDURE TODAY AT [facility]:   Refer to the procedure report that was given to you for any specific questions about what was found during the examination.  If the procedure report does not answer your questions, please call your gastroenterologist to clarify.  If you requested that your care partner not be given the details of your procedure findings, then the procedure report has been included in a sealed envelope for you to review at your convenience later.  YOU SHOULD EXPECT: Some feelings of bloating in the abdomen. Passage of more gas than usual.  Walking can help get rid of the air that was put into your GI tract during the procedure and reduce the bloating. If you had a lower endoscopy (such as a colonoscopy or flexible sigmoidoscopy) you may notice spotting of blood in your stool or on the toilet paper. If you underwent a bowel prep for your procedure, you may not have a normal bowel movement for a few days.  Please Note:  You might notice some irritation and congestion in your nose or some drainage.  This is from the oxygen used during your procedure.  There is no need for concern and it should clear up in a day or so.  SYMPTOMS TO REPORT IMMEDIATELY:   Following lower endoscopy (colonoscopy or flexible sigmoidoscopy):  Excessive amounts of blood in the stool  Significant tenderness or worsening of abdominal pains  Swelling of the abdomen that is new, acute  Fever of 100F or higher   For urgent or emergent issues, a gastroenterologist can be reached at any hour by calling [phone].   DIET:  We do  recommend a small meal at first, but then you may proceed to your regular diet.  Drink plenty of fluids but you should avoid alcoholic beverages for 24 hours.  ACTIVITY:  You should plan to take it easy for the rest of today and you should NOT DRIVE or use heavy machinery until tomorrow (because of the sedation medicines used during the test).    FOLLOW UP: Our staff will call the number listed on your records the next business day following your procedure to check on you and address any questions or concerns that you may have regarding the information given to you following your procedure. If we do not reach you, we will leave a message.  However, if you are feeling well and you are not experiencing any problems, there is no need to return our call.  We will assume that you have returned to your regular daily activities without incident.  If any biopsies were taken you will be contacted by phone or by letter within the next 1-3 weeks.  Please call us at [phone] if you have not heard about the biopsies in 3 weeks.    SIGNATURES/CONFIDENTIALITY: You and/or your care partner have signed paperwork which will be entered into your electronic medical record.  These signatures attest to the fact that that the information above on your After Visit Summary has been reviewed and is understood.  Full responsibility of the confidentiality of this  discharge information lies with you and/or your care-partner. 

## 2017-11-19 NOTE — Op Note (Signed)
[facility] Patient Name: Joshua Bowman Procedure Date: 11/19/2017 2:02 PM MRN: [id number] Endoscopist: Gatha Mayer , MD Age: 72 Referring MD:  Date of Birth: [birth year] Gender: Male Account #: [id number] Procedure:                Colonoscopy Indications:              Heme positive stool, Personal history of colonic                            polyps Medicines:                Propofol per Anesthesia, Monitored Anesthesia Care Procedure:                Pre-Anesthesia Assessment:                           - Prior to the procedure, a History and Physical                            was performed, and patient medications and                            allergies were reviewed. The patient's tolerance of                            previous anesthesia was also reviewed. The risks                            and benefits of the procedure and the sedation                            options and risks were discussed with the patient.                            All questions were answered, and informed consent                            was obtained. Prior Anticoagulants: The patient                            last took aspirin 1 day prior to the procedure. ASA                            Grade Assessment: III - A patient with severe                            systemic disease. After reviewing the risks and                            benefits, the patient was deemed in satisfactory                            condition to undergo the procedure.  After obtaining informed consent, the colonoscope                            was passed under direct vision. Throughout the                            procedure, the patient's blood pressure, pulse, and                            oxygen saturations were monitored continuously. The                            Colonoscope was introduced through the anus and                            advanced to the the cecum, identified by                            appendiceal orifice and ileocecal valve. The                            colonoscopy was performed without difficulty. The                            patient tolerated the procedure well. The quality                            of the bowel preparation was good. The ileocecal                            valve, appendiceal orifice, and rectum were                            photographed. The bowel preparation used was                            Miralax. Scope In: 2:16:18 PM Scope Out: 2:35:51 PM Scope Withdrawal Time: 0 hours 15 minutes 27 seconds  Total Procedure Duration: 0 hours 19 minutes 33 seconds  Findings:                 The perianal and digital rectal examinations were                            normal. Pertinent negatives include normal prostate                            (size, shape, and consistency).                           A 15 mm polyp was found in the rectum. The polyp                            was semi-pedunculated. The polyp was removed with a  hot snare. Resection and retrieval were complete.                            Verification of patient identification for the                            specimen was done. Estimated blood loss: none.                           Nine sessile polyps were found in the sigmoid                            colon, descending colon and transverse colon. The                            polyps were diminutive in size. These polyps were                            removed with a cold snare. Resection and retrieval                            were complete. Verification of patient                            identification for the specimen was done. Estimated                            blood loss was minimal.                           Diverticula were found in the sigmoid colon.                           The exam was otherwise without abnormality on                            direct and retroflexion  views. Complications:            No immediate complications. Estimated Blood Loss:     Estimated blood loss was minimal. Impression:               - One 15 mm polyp in the rectum, removed with a hot                            snare. Resected and retrieved.                           - Nine diminutive polyps in the sigmoid colon, in                            the descending colon and in the transverse colon,                            removed with a cold snare. Resected and retrieved.                           -  Diverticulosis in the sigmoid colon.                           - The examination was otherwise normal on direct                            and retroflexion views. Recommendation:           - Patient has a contact number available for                            emergencies. The signs and symptoms of potential                            delayed complications were discussed with the                            patient. Return to normal activities tomorrow.                            Written discharge instructions were provided to the                            patient.                           - Resume previous diet.                           - Continue present medications.                           - No aspirin, ibuprofen, naproxen, or other                            non-steroidal anti-inflammatory drugs for 2 weeks                            after polyp removal.                           - Repeat colonoscopy is recommended for                            surveillance. The colonoscopy date will be                            determined after pathology results from today's                            exam become available for review. Gatha Mayer, MD 11/19/2017 2:44:26 PM This report has been signed electronically.

## 2017-11-19 NOTE — Progress Notes (Signed)
Called to room to assist during endoscopic procedure.  Patient ID and intended procedure confirmed with present staff. Received instructions for my participation in the procedure from the performing physician.  

## 2017-11-19 NOTE — Progress Notes (Signed)
Report given to PACU, vss 

## 2017-11-19 NOTE — Progress Notes (Signed)
I have reviewed the patient's medical history in detail and updated the computerized patient record.

## 2017-11-20 ENCOUNTER — Telehealth: Payer: Self-pay

## 2017-11-20 NOTE — Telephone Encounter (Signed)
  Follow up Call-  Call back number 11/19/2017  Post procedure Call Back phone  # [phone]  Permission to leave phone message Yes  Some recent data might be hidden     Patient questions:  Do you have a fever, pain , or abdominal swelling? No. Pain Score  0 *  Have you tolerated food without any problems? Yes.    Have you been able to return to your normal activities? Yes.    Do you have any questions about your discharge instructions: Diet   No. Medications  No. Follow up visit  No.  Do you have questions or concerns about your Care? No.  Actions: * If pain score is 4 or above: No action needed, pain <4.

## 2017-11-21 ENCOUNTER — Other Ambulatory Visit: Payer: Self-pay | Admitting: Family Medicine

## 2017-11-24 ENCOUNTER — Encounter: Payer: Self-pay | Admitting: Internal Medicine

## 2017-11-24 DIAGNOSIS — Z86010 Personal history of colonic polyps: Secondary | ICD-10-CM

## 2017-11-24 NOTE — Progress Notes (Signed)
10 adenomas Recall 2020 (1 year)

## 2017-11-24 NOTE — Progress Notes (Signed)
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

## 2017-11-25 DIAGNOSIS — M9903 Segmental and somatic dysfunction of lumbar region: Secondary | ICD-10-CM | POA: Diagnosis not present

## 2017-11-25 DIAGNOSIS — M9904 Segmental and somatic dysfunction of sacral region: Secondary | ICD-10-CM | POA: Diagnosis not present

## 2017-11-25 DIAGNOSIS — M9905 Segmental and somatic dysfunction of pelvic region: Secondary | ICD-10-CM | POA: Diagnosis not present

## 2017-11-25 DIAGNOSIS — M5137 Other intervertebral disc degeneration, lumbosacral region: Secondary | ICD-10-CM | POA: Diagnosis not present

## 2017-12-02 DIAGNOSIS — M9904 Segmental and somatic dysfunction of sacral region: Secondary | ICD-10-CM | POA: Diagnosis not present

## 2017-12-02 DIAGNOSIS — M9903 Segmental and somatic dysfunction of lumbar region: Secondary | ICD-10-CM | POA: Diagnosis not present

## 2017-12-02 DIAGNOSIS — M5137 Other intervertebral disc degeneration, lumbosacral region: Secondary | ICD-10-CM | POA: Diagnosis not present

## 2017-12-02 DIAGNOSIS — M9905 Segmental and somatic dysfunction of pelvic region: Secondary | ICD-10-CM | POA: Diagnosis not present

## 2017-12-03 ENCOUNTER — Encounter: Payer: Self-pay | Admitting: Family Medicine

## 2017-12-03 ENCOUNTER — Ambulatory Visit (INDEPENDENT_AMBULATORY_CARE_PROVIDER_SITE_OTHER): Payer: Medicare Other | Admitting: Family Medicine

## 2017-12-03 VITALS — BP 165/88 | HR 113 | Temp 97.6°F | Ht 66.0 in | Wt 179.0 lb

## 2017-12-03 DIAGNOSIS — K635 Polyp of colon: Secondary | ICD-10-CM | POA: Diagnosis not present

## 2017-12-03 DIAGNOSIS — M150 Primary generalized (osteo)arthritis: Secondary | ICD-10-CM | POA: Diagnosis not present

## 2017-12-03 DIAGNOSIS — I10 Essential (primary) hypertension: Secondary | ICD-10-CM

## 2017-12-03 DIAGNOSIS — J410 Simple chronic bronchitis: Secondary | ICD-10-CM

## 2017-12-03 DIAGNOSIS — S61209A Unspecified open wound of unspecified finger without damage to nail, initial encounter: Secondary | ICD-10-CM | POA: Diagnosis not present

## 2017-12-03 DIAGNOSIS — E559 Vitamin D deficiency, unspecified: Secondary | ICD-10-CM | POA: Diagnosis not present

## 2017-12-03 DIAGNOSIS — N400 Enlarged prostate without lower urinary tract symptoms: Secondary | ICD-10-CM | POA: Diagnosis not present

## 2017-12-03 DIAGNOSIS — M159 Polyosteoarthritis, unspecified: Secondary | ICD-10-CM

## 2017-12-03 DIAGNOSIS — E7800 Pure hypercholesterolemia, unspecified: Secondary | ICD-10-CM | POA: Diagnosis not present

## 2017-12-03 DIAGNOSIS — R6882 Decreased libido: Secondary | ICD-10-CM | POA: Diagnosis not present

## 2017-12-03 DIAGNOSIS — Z23 Encounter for immunization: Secondary | ICD-10-CM | POA: Diagnosis not present

## 2017-12-03 DIAGNOSIS — I6529 Occlusion and stenosis of unspecified carotid artery: Secondary | ICD-10-CM | POA: Diagnosis not present

## 2017-12-03 DIAGNOSIS — R351 Nocturia: Secondary | ICD-10-CM

## 2017-12-03 LAB — MICROSCOPIC EXAMINATION
Bacteria, UA: NONE SEEN
Epithelial Cells (non renal): NONE SEEN /hpf (ref 0–10)
Renal Epithel, UA: NONE SEEN /hpf
WBC, UA: NONE SEEN /hpf (ref 0–?)

## 2017-12-03 LAB — URINALYSIS, COMPLETE
BILIRUBIN UA: NEGATIVE
Glucose, UA: NEGATIVE
KETONES UA: NEGATIVE
LEUKOCYTES UA: NEGATIVE
NITRITE UA: NEGATIVE
PH UA: 7 (ref 5.0–7.5)
Protein, UA: NEGATIVE
Specific Gravity, UA: 1.01 (ref 1.005–1.030)
Urobilinogen, Ur: 0.2 mg/dL (ref 0.2–1.0)

## 2017-12-03 MED ORDER — HYDROCODONE-ACETAMINOPHEN 5-325 MG PO TABS: 1.0000 | ORAL_TABLET | Freq: Four times a day (QID) | ORAL | 0 refills | Status: DC | PRN

## 2017-12-03 NOTE — Addendum Note (Signed)
Addended by: Chipper Herb on: 12/03/2017 12:49 PM   Modules accepted: Orders

## 2017-12-03 NOTE — Patient Instructions (Addendum)
Medicare Annual Wellness Visit  [facility] and the medical providers at [facility] strive to bring you the best medical care.  In doing so we not only want to address your current medical conditions and concerns but also to detect new conditions early and prevent illness, disease and health-related problems.    Medicare offers a yearly Wellness Visit which allows our clinical staff to assess your need for preventative services including immunizations, lifestyle education, counseling to decrease risk of preventable diseases and screening for fall risk and other medical concerns.    This visit is provided free of charge (no copay) for all Medicare recipients. The clinical pharmacists at [facility] have begun to conduct these Wellness Visits which will also include a thorough review of all your medications.    As you primary medical provider recommend that you make an appointment for your Annual Wellness Visit if you have not done so already this year.  You may set up this appointment before you leave today or you may call back ([phone]) and schedule an appointment.  Please make sure when you call that you mention that you are scheduling your Annual Wellness Visit with the clinical pharmacist so that the appointment may be made for the proper length of time.     Continue current medications. Continue good therapeutic lifestyle changes which include good diet and exercise. Fall precautions discussed with patient. If an FOBT was given today- please return it to our front desk. If you are over 59 years old - you may need Prevnar 2 or the adult Pneumonia vaccine.  **Flu shots are available--- please call and schedule a FLU-CLINIC appointment**  After your visit with [place] today you will receive a survey in the mail or online from [employer] regarding your care with [place]. Please take a moment to fill this out. Your feedback is very  important to [place] as you can help [place] better understand your patient needs as well as improve your experience and satisfaction. WE CARE ABOUT YOU!!!   Continue to follow-up with gastroenterology for repeat colonoscopies as directed by him.  This is very important. Continue to walk and exercise daily for arthritis in the back Continue to monitor blood pressures at home and bring readings to each office visit and watch salt intake as much as possible

## 2017-12-03 NOTE — Addendum Note (Signed)
Addended by: Zannie Cove on: 12/03/2017 12:27 PM   Modules accepted: Orders

## 2017-12-03 NOTE — Progress Notes (Signed)
Subjective:    Patient ID: Joshua Bowman, male    DOB: [birth year], 72 y.o.   MRN: [id number]  HPI Pt here for follow up and management of chronic medical problems which includes hyperlipidemia. He is taking medication regularly.  The patient just recently had a colonoscopy and multiple polyps were removed and thought to be precancerous.  It was important for the patient to follow-up again in 3 years for repeat colonoscopy.  He does complain today of urgency and nocturia.  He also has a scratch on his hand and will need a Tdap.  We will refill his pain medication that he takes for his chronic back pain.  The initial blood pressure was elevated at 165/88 and we will repeat that.  His pulse rate was also increased at 113.  He brings blood pressures from home and these are always good.  These readings will be scanned into the record.  Patient denies any chest pain pressure or tightness.  He is not had any more shortness of breath than usual.  He has his ongoing back pain which she says is better.  He has left knee pain which is usually secondary to activity.  He has a healing scratch wound on his right hand dorsal finger area which appears to be healing with only slight redness.  We will go ahead and give him a tetanus shot today.  He denies any trouble with his stomach since he has had a colonoscopy including nausea vomiting diarrhea blood in the stool or black tarry bowel movements.  He has had the urgency and nocturia for a while and usually gets up a couple times at nighttime.     Patient Active Problem List   Diagnosis Date Noted  . COPD (chronic obstructive pulmonary disease) ([place]) 06/20/2016  . Coronary artery calcification 02/07/2015  . BPH (benign prostatic hyperplasia) 12/06/2013  . Metabolic syndrome 58/05/9832  . Carotid artery stenosis 12/06/2013  . Abnormal chest CT 08/17/2013  . Hypertension 03/30/2013  . Degenerative arthritis 03/30/2013  . Hyperlipidemia 03/30/2013  . Personal  history of adenomatous colonic polyps - attenuated Polyposis syndrome 05/06/2012   Outpatient Encounter Medications as of 12/03/2017  Medication Sig  . albuterol (PROAIR HFA) 108 (90 BASE) MCG/ACT inhaler INHALE ONE OR TWO PUFFS BY MOUTH FOUR TIMES DAILY AS NEEDED  . aspirin 325 MG tablet Take 325 mg by mouth daily.  . Cholecalciferol (VITAMIN D3) 5000 UNITS CAPS Take 1 tablet by mouth. 2-3 x week  . diclofenac sodium (VOLTAREN) 1 % GEL Apply 4 g topically 2 (two) times daily.  Marland Kitchen HYDROcodone-acetaminophen (NORCO/VICODIN) 5-325 MG tablet Take 1 tablet by mouth every 6 (six) hours as needed for moderate pain.  Marland Kitchen LORazepam (ATIVAN) 0.5 MG tablet Take 1 tablet (0.5 mg total) 2 (two) times daily as needed by mouth.  . losartan (COZAAR) 50 MG tablet TAKE 1 TABLET (50 MG TOTAL) BY MOUTH DAILY.  . SYMBICORT 160-4.5 MCG/ACT inhaler INHALE 2 PUFFS BY MOUTH INTO THE LUNGS TWICE A DAY (RINSE MOUTH AFTER EACH USE)  . ZETIA 10 MG tablet TAKE 0.5 TABLETS (5 MG TOTAL) BY MOUTH DAILY.   No facility-administered encounter medications on file as of 12/03/2017.       Review of Systems  Constitutional: Negative.   HENT: Negative.   Eyes: Negative.   Respiratory: Negative.   Cardiovascular: Negative.   Gastrointestinal: Negative.   Endocrine: Negative.   Genitourinary: Positive for urgency (at night ).  Musculoskeletal: Negative.   Skin: Positive  for wound (right middle finger laceration / semi-healed - cut in a screw).  Allergic/Immunologic: Negative.   Neurological: Negative.   Hematological: Negative.   Psychiatric/Behavioral: Negative.        Objective:   Physical Exam  Constitutional: He is oriented to person, place, and time. He appears well-developed and well-nourished. No distress.  Patient is pleasant and relaxed  HENT:  Head: Normocephalic and atraumatic.  Right Ear: External ear normal.  Left Ear: External ear normal.  Mouth/Throat: Oropharynx is clear and moist. No oropharyngeal  exudate.  Nasal turbinate congestion bilaterally  Eyes: Conjunctivae and EOM are normal. Pupils are equal, round, and reactive to light. Right eye exhibits no discharge. Left eye exhibits no discharge. No scleral icterus.  Neck: Normal range of motion. Neck supple. No thyromegaly present.  No bruits thyromegaly or anterior cervical adenopathy  Cardiovascular: Normal rate, regular rhythm, normal heart sounds and intact distal pulses.  No murmur heard. The heart is regular at 84/min  Pulmonary/Chest: Effort normal. No respiratory distress. He has no wheezes. He has no rales. He exhibits no tenderness.  Sparse wheezes on deep inhalation and expiration.  No axillary adenopathy  Abdominal: Soft. Bowel sounds are normal. He exhibits no mass. There is no tenderness. There is no rebound and no guarding.  No liver or spleen enlargement.  No epigastric tenderness.  No inguinal adenopathy with no abnormal bruits.  Musculoskeletal: He exhibits no edema or tenderness.  Patient has knee rigidity and ongoing back stiffness with movement and motion.  He has a significant bunion in the great toe of the right foot.  Lymphadenopathy:    He has no cervical adenopathy.  Neurological: He is alert and oriented to person, place, and time. He has normal reflexes. No cranial nerve deficit.  Skin: Skin is warm and dry. No rash noted.  Psychiatric: He has a normal mood and affect. His behavior is normal. Judgment and thought content normal.  Nursing note and vitals reviewed.  BP (!) 165/88 (BP Location: Left Arm)   Pulse (!) 113   Temp 97.6 F (36.4 C) (Oral)   Ht '5\' 6"'  (1.676 m)   Wt 179 lb (81.2 kg)   BMI 28.89 kg/m         Assessment & Plan:  1. Pure hypercholesterolemia -Continue with aggressive therapeutic lifestyle changes and Zetia for cholesterol control pending results of lab work - BMP8+EGFR - CBC with Differential/Platelet - Lipid panel - Hepatic function panel  2. Vitamin D  deficiency -Continue with vitamin D replacement pending results of lab work - CBC with Differential/Platelet - VITAMIN D 25 Hydroxy (Vit-D Deficiency, Fractures)  3. Benign prostatic hyperplasia, unspecified whether lower urinary tract symptoms present -The patient is having nocturia and frequency and most likely this is associated with his BPH.  We will get a urine analysis today. - CBC with Differential/Platelet  4. Nocturia -Check urinalysis - CBC with Differential/Platelet - Urinalysis, Complete  5. Open wound of finger, initial encounter - Td : Tetanus/diphtheria >7yo Preservative  free  6. Decreased libido  7. Essential hypertension -Blood pressure appears to be always elevated with this office visit.  He does bring in a reading from the gastroenterologist and it was 124/78.  His outside readings are all good.  We will not make any further changes with his treatment for this.  8. Primary osteoarthritis involving multiple joints -Continue with walking and exercise as much as possible  9. Stenosis of carotid artery, unspecified laterality -No bruits audible today.  10. Simple chronic bronchitis ([place]) -Continue to protect airways and reduce and stop cigarette smoking  11. Polyp of colon, unspecified part of colon, unspecified type -Follow-up with colonoscopies as planned in 3 years.  Patient Instructions                       Medicare Annual Wellness Visit  [facility] and the medical providers at [facility] strive to bring you the best medical care.  In doing so we not only want to address your current medical conditions and concerns but also to detect new conditions early and prevent illness, disease and health-related problems.    Medicare offers a yearly Wellness Visit which allows our clinical staff to assess your need for preventative services including immunizations, lifestyle education, counseling to decrease risk of preventable diseases and  screening for fall risk and other medical concerns.    This visit is provided free of charge (no copay) for all Medicare recipients. The clinical pharmacists at [facility] have begun to conduct these Wellness Visits which will also include a thorough review of all your medications.    As you primary medical provider recommend that you make an appointment for your Annual Wellness Visit if you have not done so already this year.  You may set up this appointment before you leave today or you may call back ([phone]) and schedule an appointment.  Please make sure when you call that you mention that you are scheduling your Annual Wellness Visit with the clinical pharmacist so that the appointment may be made for the proper length of time.     Continue current medications. Continue good therapeutic lifestyle changes which include good diet and exercise. Fall precautions discussed with patient. If an FOBT was given today- please return it to our front desk. If you are over 69 years old - you may need Prevnar 65 or the adult Pneumonia vaccine.  **Flu shots are available--- please call and schedule a FLU-CLINIC appointment**  After your visit with [place] today you will receive a survey in the mail or online from [employer] regarding your care with [place]. Please take a moment to fill this out. Your feedback is very important to [place] as you can help [place] better understand your patient needs as well as improve your experience and satisfaction. WE CARE ABOUT YOU!!!   Continue to follow-up with gastroenterology for repeat colonoscopies as directed by him.  This is very important. Continue to walk and exercise daily for arthritis in the back Continue to monitor blood pressures at home and bring readings to each office visit and watch salt intake as much as possible   No orders of the defined types were placed in this encounter.   Arrie Senate MD

## 2017-12-04 LAB — LIPID PANEL
CHOL/HDL RATIO: 3 ratio (ref 0.0–5.0)
CHOLESTEROL TOTAL: 216 mg/dL — AB (ref 100–199)
HDL: 73 mg/dL (ref >39)
LDL Calculated: 132 mg/dL — ABNORMAL HIGH (ref 0–99)
Triglycerides: 56 mg/dL (ref 0–149)
VLDL Cholesterol Cal: 11 mg/dL (ref 5–40)

## 2017-12-04 LAB — CBC WITH DIFFERENTIAL/PLATELET
Basophils Absolute: 0 x10E3/uL (ref 0.0–0.2)
Basos: 0 %
EOS (ABSOLUTE): 0 x10E3/uL (ref 0.0–0.4)
Eos: 0 %
Hematocrit: 51.4 % — ABNORMAL HIGH (ref 37.5–51.0)
Hemoglobin: 17.5 g/dL (ref 13.0–17.7)
Immature Grans (Abs): 0 x10E3/uL (ref 0.0–0.1)
Immature Granulocytes: 0 %
Lymphocytes Absolute: 2 x10E3/uL (ref 0.7–3.1)
Lymphs: 20 %
MCH: 31.7 pg (ref 26.6–33.0)
MCHC: 34 g/dL (ref 31.5–35.7)
MCV: 93 fL (ref 79–97)
Monocytes Absolute: 0.6 x10E3/uL (ref 0.1–0.9)
Monocytes: 6 %
Neutrophils Absolute: 7.5 x10E3/uL — ABNORMAL HIGH (ref 1.4–7.0)
Neutrophils: 74 %
Platelets: 263 x10E3/uL (ref 150–379)
RBC: 5.52 x10E6/uL (ref 4.14–5.80)
RDW: 14.1 % (ref 12.3–15.4)
WBC: 10.2 x10E3/uL (ref 3.4–10.8)

## 2017-12-04 LAB — HEPATIC FUNCTION PANEL
ALT: 11 IU/L (ref 0–44)
AST: 17 IU/L (ref 0–40)
Albumin: 4.4 g/dL (ref 3.5–4.8)
Alkaline Phosphatase: 54 IU/L (ref 39–117)
Bilirubin Total: 0.3 mg/dL (ref 0.0–1.2)
Bilirubin, Direct: 0.08 mg/dL (ref 0.00–0.40)
Total Protein: 6.8 g/dL (ref 6.0–8.5)

## 2017-12-04 LAB — BMP8+EGFR
BUN/Creatinine Ratio: 13 (ref 10–24)
BUN: 12 mg/dL (ref 8–27)
CHLORIDE: 98 mmol/L (ref 96–106)
CO2: 26 mmol/L (ref 20–29)
Calcium: 10.2 mg/dL (ref 8.6–10.2)
Creatinine, Ser: 0.94 mg/dL (ref 0.76–1.27)
GFR calc Af Amer: 94 mL/min/{1.73_m2} (ref >59)
GFR calc non Af Amer: 81 mL/min/{1.73_m2} (ref >59)
GLUCOSE: 92 mg/dL (ref 65–99)
POTASSIUM: 4.4 mmol/L (ref 3.5–5.2)
SODIUM: 140 mmol/L (ref 134–144)

## 2017-12-04 LAB — VITAMIN D 25 HYDROXY (VIT D DEFICIENCY, FRACTURES): VIT D 25 HYDROXY: 35.2 ng/mL (ref 30.0–100.0)

## 2017-12-10 ENCOUNTER — Telehealth: Payer: Self-pay | Admitting: *Deleted

## 2017-12-10 NOTE — Telephone Encounter (Signed)
-----   Message from Chipper Herb, MD sent at 12/04/2017  7:52 AM EST ----- All liver function tests including the direct bilirubin are within normal limits Please call the patient with this result as well as with results with recommendations that have already been reviewed In a note from yesterday he continues to have red blood cells in the urine and my recommendation and that note yesterday was that he should have a one-time visit with the urologist to make sure there is no lesion in the bladder causing the red blood cells in the urine.

## 2017-12-10 NOTE — Telephone Encounter (Signed)
Pt notified of results Verbalizes understanding Pt will call to schedule appt with urology

## 2017-12-15 NOTE — Addendum Note (Signed)
Addended by: Antonietta Barcelona D on: 12/15/2017 08:52 AM   Modules accepted: Orders

## 2017-12-16 MED ORDER — HYDROCODONE-ACETAMINOPHEN 5-325 MG PO TABS
1.0000 | ORAL_TABLET | Freq: Four times a day (QID) | ORAL | 0 refills | Status: DC | PRN
Start: 2017-12-16 — End: 2018-04-22

## 2017-12-16 NOTE — Addendum Note (Signed)
Addended by: Chipper Herb on: 12/16/2017 12:47 PM   Modules accepted: Orders

## 2017-12-30 DIAGNOSIS — M9904 Segmental and somatic dysfunction of sacral region: Secondary | ICD-10-CM | POA: Diagnosis not present

## 2017-12-30 DIAGNOSIS — M9903 Segmental and somatic dysfunction of lumbar region: Secondary | ICD-10-CM | POA: Diagnosis not present

## 2017-12-30 DIAGNOSIS — M9905 Segmental and somatic dysfunction of pelvic region: Secondary | ICD-10-CM | POA: Diagnosis not present

## 2017-12-30 DIAGNOSIS — M5137 Other intervertebral disc degeneration, lumbosacral region: Secondary | ICD-10-CM | POA: Diagnosis not present

## 2018-02-25 ENCOUNTER — Other Ambulatory Visit: Payer: Self-pay | Admitting: Family Medicine

## 2018-04-22 ENCOUNTER — Ambulatory Visit (INDEPENDENT_AMBULATORY_CARE_PROVIDER_SITE_OTHER): Payer: Medicare Other | Admitting: Family Medicine

## 2018-04-22 ENCOUNTER — Other Ambulatory Visit: Payer: Self-pay | Admitting: *Deleted

## 2018-04-22 ENCOUNTER — Encounter: Payer: Self-pay | Admitting: Family Medicine

## 2018-04-22 VITALS — BP 134/91 | HR 114 | Temp 98.3°F | Ht 66.0 in | Wt 173.0 lb

## 2018-04-22 DIAGNOSIS — M25512 Pain in left shoulder: Secondary | ICD-10-CM

## 2018-04-22 DIAGNOSIS — G8929 Other chronic pain: Secondary | ICD-10-CM | POA: Diagnosis not present

## 2018-04-22 DIAGNOSIS — E559 Vitamin D deficiency, unspecified: Secondary | ICD-10-CM | POA: Diagnosis not present

## 2018-04-22 DIAGNOSIS — R52 Pain, unspecified: Secondary | ICD-10-CM

## 2018-04-22 DIAGNOSIS — E7800 Pure hypercholesterolemia, unspecified: Secondary | ICD-10-CM

## 2018-04-22 DIAGNOSIS — N400 Enlarged prostate without lower urinary tract symptoms: Secondary | ICD-10-CM | POA: Diagnosis not present

## 2018-04-22 DIAGNOSIS — Z0000 Encounter for general adult medical examination without abnormal findings: Secondary | ICD-10-CM

## 2018-04-22 DIAGNOSIS — M25511 Pain in right shoulder: Secondary | ICD-10-CM | POA: Diagnosis not present

## 2018-04-22 DIAGNOSIS — M150 Primary generalized (osteo)arthritis: Secondary | ICD-10-CM

## 2018-04-22 DIAGNOSIS — I10 Essential (primary) hypertension: Secondary | ICD-10-CM

## 2018-04-22 DIAGNOSIS — J42 Unspecified chronic bronchitis: Secondary | ICD-10-CM

## 2018-04-22 DIAGNOSIS — M159 Polyosteoarthritis, unspecified: Secondary | ICD-10-CM

## 2018-04-22 LAB — URINALYSIS, COMPLETE
Bilirubin, UA: NEGATIVE
Glucose, UA: NEGATIVE
Ketones, UA: NEGATIVE
Leukocytes, UA: NEGATIVE
NITRITE UA: NEGATIVE
PH UA: 7 (ref 5.0–7.5)
PROTEIN UA: NEGATIVE
Specific Gravity, UA: 1.015 (ref 1.005–1.030)
UUROB: 0.2 mg/dL (ref 0.2–1.0)

## 2018-04-22 LAB — MICROSCOPIC EXAMINATION
BACTERIA UA: NONE SEEN
Epithelial Cells (non renal): NONE SEEN /hpf (ref 0–10)
RENAL EPITHEL UA: NONE SEEN /HPF
WBC, UA: NONE SEEN /hpf (ref 0–5)

## 2018-04-22 MED ORDER — METHYLPREDNISOLONE ACETATE 80 MG/ML IJ SUSP
40.0000 mg | Freq: Once | INTRAMUSCULAR | Status: AC
  Administered 2018-04-22: 40 mg via INTRA_ARTICULAR

## 2018-04-22 MED ORDER — HYDROCODONE-ACETAMINOPHEN 5-325 MG PO TABS: 1.0000 | ORAL_TABLET | Freq: Four times a day (QID) | ORAL | 0 refills | Status: DC | PRN

## 2018-04-22 NOTE — Patient Instructions (Addendum)
Medicare Annual Wellness Visit  [facility] and the medical providers at [facility] strive to bring you the best medical care.  In doing so we not only want to address your current medical conditions and concerns but also to detect new conditions early and prevent illness, disease and health-related problems.    Medicare offers a yearly Wellness Visit which allows our clinical staff to assess your need for preventative services including immunizations, lifestyle education, counseling to decrease risk of preventable diseases and screening for fall risk and other medical concerns.    This visit is provided free of charge (no copay) for all Medicare recipients. The clinical pharmacists at [facility] have begun to conduct these Wellness Visits which will also include a thorough review of all your medications.    As you primary medical provider recommend that you make an appointment for your Annual Wellness Visit if you have not done so already this year.  You may set up this appointment before you leave today or you may call back ([phone]) and schedule an appointment.  Please make sure when you call that you mention that you are scheduling your Annual Wellness Visit with the clinical pharmacist so that the appointment may be made for the proper length of time.     Continue current medications. Continue good therapeutic lifestyle changes which include good diet and exercise. Fall precautions discussed with patient. If an FOBT was given today- please return it to our front desk. If you are over 37 years old - you may need Prevnar 79 or the adult Pneumonia vaccine.  **Flu shots are available--- please call and schedule a FLU-CLINIC appointment**  After your visit with [place] today you will receive a survey in the mail or online from [employer] regarding your care with [place]. Please take a moment to fill this out. Your feedback is very  important to [place] as you can help [place] better understand your patient needs as well as improve your experience and satisfaction. WE CARE ABOUT YOU!!!   Avoid aggressive activity with both shoulders and arms for the next 4 to 5 days. Use warm wet compresses to each shoulder. If the shoulder pain does not improve and the problems continue, please call back and make an appointment to see the orthopedic surgeon that comes here each month in August. He is reminded and encouraged to stop smoking He should get an eye exam as this is past due Use soaps fabric softeners and detergents that are not scented Patient can try Monistat vaginal cream sparingly twice daily to the growing rash if it returns

## 2018-04-22 NOTE — Progress Notes (Signed)
Subjective:    Patient ID: Joshua Bowman, male    DOB: 12-06-1945, 72 y.o.   MRN: [id number]  HPI Patient is here today for annual wellness exam and follow up of chronic medical problems which includes hyperlipidemia and hypertension. He is taking medication regularly.  The patient is doing well overall but does complain today with bilateral shoulder pain and a growing rash.  He is requesting refills on his pain medication.  He does bring in blood pressure readings from the outside and all these readings are excellent.  They will be scanned into the record.  A repeat blood pressure by the nurse was 134/91.  The patient denies any chest pain or increased shortness of breath.  He denies any trouble with his stomach other than some occasional indigestion.  He did have multiple polyps on his last colonoscopy and will be due another colonoscopy early in 2020.  He denies any blood in the stool or black tarry bowel movements or change in bowel habits.  He has nocturia 1-2 times daily at nighttime because of his enlarged prostate.  He is complaining with both shoulders having pain.  He has a history of rotator cuff repair on the right and probably has a rotator cuff tendinopathy on the left.  We will do 40 of Depo-Medrol to each shoulder and he understands if he does not get better he will call back and we will make arrangements for him to see the orthopedic surgeon when he is here in August.    Patient Active Problem List   Diagnosis Date Noted  . COPD (chronic obstructive pulmonary disease) ([place]) 06/20/2016  . Coronary artery calcification 02/07/2015  . BPH (benign prostatic hyperplasia) 12/06/2013  . Metabolic syndrome 10/62/6948  . Carotid artery stenosis 12/06/2013  . Abnormal chest CT 08/17/2013  . Hypertension 03/30/2013  . Degenerative arthritis 03/30/2013  . Hyperlipidemia 03/30/2013  . Personal history of adenomatous colonic polyps - attenuated Polyposis syndrome 05/06/2012   Outpatient  Encounter Medications as of 04/22/2018  Medication Sig  . albuterol (PROAIR HFA) 108 (90 BASE) MCG/ACT inhaler INHALE ONE OR TWO PUFFS BY MOUTH FOUR TIMES DAILY AS NEEDED  . aspirin 325 MG tablet Take 325 mg by mouth daily.  . Cholecalciferol (VITAMIN D3) 5000 UNITS CAPS Take 1 tablet by mouth. 2-3 x week  . diclofenac sodium (VOLTAREN) 1 % GEL Apply 4 g topically 2 (two) times daily.  Mar[facility] Kitchen HYDROcodone-acetaminophen (NORCO/VICODIN) 5-325 MG tablet Take 1 tablet by mouth every 6 (six) hours as needed for moderate pain.  Mar[facility] Kitchen LORazepam (ATIVAN) 0.5 MG tablet TAKE 1 TABLET BY MOUTH TWICE A DAY AS NEEDED  . losartan (COZAAR) 50 MG tablet TAKE 1 TABLET (50 MG TOTAL) BY MOUTH DAILY.  . SYMBICORT 160-4.5 MCG/ACT inhaler INHALE 2 PUFFS BY MOUTH INTO THE LUNGS TWICE A DAY (RINSE MOUTH AFTER EACH USE)  . ZETIA 10 MG tablet TAKE 0.5 TABLETS (5 MG TOTAL) BY MOUTH DAILY.  Marland Kitchen HYDROcodone-acetaminophen (NORCO/VICODIN) 5-325 MG tablet Take 1 tablet by mouth every 6 (six) hours as needed for moderate pain. (Patient not taking: Reported on 04/22/2018)  . HYDROcodone-acetaminophen (NORCO/VICODIN) 5-325 MG tablet Take 1 tablet by mouth every 6 (six) hours as needed for moderate pain. (Patient not taking: Reported on 04/22/2018)   No facility-administered encounter medications on file as of 04/22/2018.      Review of Systems  Constitutional: Negative.   HENT: Negative.   Eyes: Negative.   Respiratory: Negative.   Cardiovascular: Negative.  Gastrointestinal: Negative.   Endocrine: Negative.   Genitourinary: Negative.   Musculoskeletal: Positive for arthralgias (bilateral shoudler pain).  Skin: Positive for rash (upper thighs).  Allergic/Immunologic: Negative.   Neurological: Negative.   Hematological: Negative.   Psychiatric/Behavioral: Negative.        Objective:   Physical Exam  Constitutional: He is oriented to person, place, and time. He appears well-developed and well-nourished. No distress.  The patient  is pleasant and relaxed  HENT:  Head: Normocephalic and atraumatic.  Right Ear: External ear normal.  Left Ear: External ear normal.  Nose: Nose normal.  Mouth/Throat: Oropharynx is clear and moist. No oropharyngeal exudate.  Eyes: Pupils are equal, round, and reactive to light. Conjunctivae and EOM are normal. Right eye exhibits no discharge. Left eye exhibits no discharge. No scleral icterus.  Neck: Normal range of motion. Neck supple. No thyromegaly present.  No bruits thyromegaly or anterior cervical adenopathy  Cardiovascular: Normal rate, regular rhythm, normal heart sounds and intact distal pulses.  No murmur heard. Heart has a regular rate and rhythm at 84/min with good pedal pulses  Pulmonary/Chest: Effort normal. He has wheezes. He has no rales. He exhibits no tenderness.  Clear anteriorly and posteriorly and no axillary adenopathy rare wheezing.  Abdominal: Soft. Bowel sounds are normal. He exhibits no mass. There is no tenderness. There is no rebound and no guarding.  No liver or spleen enlargement.  No masses and no bruits and no inguinal adenopathy  Genitourinary: Rectum normal and penis normal.  Genitourinary Comments: The prostate is enlarged with no specific lumps or masses.  There were no rectal masses.  External genitalia were within normal limits and no inguinal hernias were palpable.  Musculoskeletal: Normal range of motion. He exhibits tenderness. He exhibits no edema.  Tenderness both lateral shoulders with scar on right shoulder from previous rotator cuff surgery  Lymphadenopathy:    He has no cervical adenopathy.  Neurological: He is alert and oriented to person, place, and time. He has normal reflexes. No cranial nerve deficit.  Skin: Skin is warm and dry. No rash noted. No erythema. No pallor.  Fading groin rash primarily on inner aspects of both thighs up to the Groin.  No redness.  Psychiatric: He has a normal mood and affect. His behavior is normal. Judgment  and thought content normal.  Patient's mood affect and behavior were all normal.  Nursing note and vitals reviewed.  BP (!) 184/95 (BP Location: Left Arm)   Pulse (!) 113   Temp 98.3 F (36.8 C) (Oral)   Ht '5\' 6"'$  (1.676 m)   Wt 173 lb (78.5 kg)   BMI 27.92 kg/m        Assessment & Plan:  1. Pain management -Patient has on going pain management because of chronic knee pain bilaterally and shoulder pain. - CBC with Differential/Platelet  2. Annual physical exam - BMP8+EGFR - CBC with Differential/Platelet - Lipid panel - VITAMIN D 25 Hydroxy (Vit-D Deficiency, Fractures) - Hepatic function panel - PSA, total and free - Urinalysis, Complete  3. Vitamin D deficiency -Continue vitamin D replacement pending results of lab work - CBC with Differential/Platelet - VITAMIN D 25 Hydroxy (Vit-D Deficiency, Fractures)  4. Pure hypercholesterolemia -Continue with statin drug and aggressive therapeutic lifestyle changes pending results of lab work - CBC with Differential/Platelet - Lipid panel  5. Benign prostatic hyperplasia, unspecified whether lower urinary tract symptoms present -Prostate is very enlarged without lumps or masses. - CBC with Differential/Platelet - PSA, total  and free - Urinalysis, Complete  6. Essential hypertension -Home blood pressure readings are good consistently and those in the office are elevated.  No change in treatment - BMP8+EGFR - CBC with Differential/Platelet - Hepatic function panel  7. Primary osteoarthritis involving multiple joints - HYDROcodone-acetaminophen (NORCO/VICODIN) 5-325 MG tablet; Take 1 tablet by mouth every 6 (six) hours as needed for moderate pain.  Dispense: 120 tablet; Refill: 0 - HYDROcodone-acetaminophen (NORCO/VICODIN) 5-325 MG tablet; Take 1 tablet by mouth every 6 (six) hours as needed for moderate pain.  Dispense: 120 tablet; Refill: 0 - HYDROcodone-acetaminophen (NORCO/VICODIN) 5-325 MG tablet; Take 1 tablet by mouth  every 6 (six) hours as needed for moderate pain.  Dispense: 120 tablet; Refill: 0  8. Chronic pain of both shoulders -Tendinopathy of both shoulders with history of rotator cuff repair on the right.  40 of Depo-Medrol was injected to each shoulder. - methylPREDNISolone acetate (DEPO-MEDROL) injection 40 mg - methylPREDNISolone acetate (DEPO-MEDROL) injection 40 mg  9. Bilateral shoulder pain, unspecified chronicity -40 of Depo-Medrol to each shoulder and patient was reminded that if this does not help he should call back and get an appointment with orthopedic surgeon when he is here in August.  10. Chronic bronchitis, unspecified chronic bronchitis type ([place]) -Continue to make all efforts at stopping smoking.  Meds ordered this encounter  Medications  . HYDROcodone-acetaminophen (NORCO/VICODIN) 5-325 MG tablet    Sig: Take 1 tablet by mouth every 6 (six) hours as needed for moderate pain.    Dispense:  120 tablet    Refill:  0    Fill 30 days after last rx  . HYDROcodone-acetaminophen (NORCO/VICODIN) 5-325 MG tablet    Sig: Take 1 tablet by mouth every 6 (six) hours as needed for moderate pain.    Dispense:  120 tablet    Refill:  0    Fill 30 after last rx  . HYDROcodone-acetaminophen (NORCO/VICODIN) 5-325 MG tablet    Sig: Take 1 tablet by mouth every 6 (six) hours as needed for moderate pain.    Dispense:  120 tablet    Refill:  0    Fill 30 days after last rx  . methylPREDNISolone acetate (DEPO-MEDROL) injection 40 mg  . methylPREDNISolone acetate (DEPO-MEDROL) injection 40 mg   Patient Instructions                       Medicare Annual Wellness Visit  [facility] and the medical providers at [facility] strive to bring you the best medical care.  In doing so we not only want to address your current medical conditions and concerns but also to detect new conditions early and prevent illness, disease and health-related problems.    Medicare offers a  yearly Wellness Visit which allows our clinical staff to assess your need for preventative services including immunizations, lifestyle education, counseling to decrease risk of preventable diseases and screening for fall risk and other medical concerns.    This visit is provided free of charge (no copay) for all Medicare recipients. The clinical pharmacists at [facility] have begun to conduct these Wellness Visits which will also include a thorough review of all your medications.    As you primary medical provider recommend that you make an appointment for your Annual Wellness Visit if you have not done so already this year.  You may set up this appointment before you leave today or you may call back ([phone]) and schedule an appointment.  Please make sure when you call that you mention that you are scheduling your Annual Wellness Visit with the clinical pharmacist so that the appointment may be made for the proper length of time.     Continue current medications. Continue good therapeutic lifestyle changes which include good diet and exercise. Fall precautions discussed with patient. If an FOBT was given today- please return it to our front desk. If you are over 36 years old - you may need Prevnar 5 or the adult Pneumonia vaccine.  **Flu shots are available--- please call and schedule a FLU-CLINIC appointment**  After your visit with [place] today you will receive a survey in the mail or online from [employer] regarding your care with [place]. Please take a moment to fill this out. Your feedback is very important to [place] as you can help [place] better understand your patient needs as well as improve your experience and satisfaction. WE CARE ABOUT YOU!!!   Avoid aggressive activity with both shoulders and arms for the next 4 to 5 days. Use warm wet compresses to each shoulder. If the shoulder pain does not improve and the problems continue, please call back and make an appointment to see  the orthopedic surgeon that comes here each month in August. He is reminded and encouraged to stop smoking He should get an eye exam as this is past due Use soaps fabric softeners and detergents that are not scented Patient can try Monistat vaginal cream sparingly twice daily to the growing rash if it returns  Arrie Senate MD

## 2018-04-23 LAB — CBC WITH DIFFERENTIAL/PLATELET
BASOS ABS: 0.1 10*3/uL (ref 0.0–0.2)
BASOS: 1 %
EOS (ABSOLUTE): 0 10*3/uL (ref 0.0–0.4)
Eos: 0 %
HEMOGLOBIN: 17.7 g/dL (ref 13.0–17.7)
Hematocrit: 52.5 % — ABNORMAL HIGH (ref 37.5–51.0)
IMMATURE GRANS (ABS): 0 10*3/uL (ref 0.0–0.1)
Immature Granulocytes: 0 %
LYMPHS: 20 %
Lymphocytes Absolute: 1.8 10*3/uL (ref 0.7–3.1)
MCH: 31.4 pg (ref 26.6–33.0)
MCHC: 33.7 g/dL (ref 31.5–35.7)
MCV: 93 fL (ref 79–97)
MONOCYTES: 8 %
Monocytes Absolute: 0.8 10*3/uL (ref 0.1–0.9)
NEUTROS ABS: 6.7 10*3/uL (ref 1.4–7.0)
Neutrophils: 71 %
Platelets: 270 10*3/uL (ref 150–450)
RBC: 5.63 x10E6/uL (ref 4.14–5.80)
RDW: 12.9 % (ref 12.3–15.4)
WBC: 9.4 10*3/uL (ref 3.4–10.8)

## 2018-04-23 LAB — PSA, TOTAL AND FREE
PSA FREE: 0.49 ng/mL
PSA, Free Pct: 17.5 %
Prostate Specific Ag, Serum: 2.8 ng/mL (ref 0.0–4.0)

## 2018-04-23 LAB — HEPATIC FUNCTION PANEL
ALK PHOS: 53 IU/L (ref 39–117)
ALT: 11 IU/L (ref 0–44)
AST: 17 IU/L (ref 0–40)
Albumin: 4.2 g/dL (ref 3.5–4.8)
Bilirubin Total: 0.4 mg/dL (ref 0.0–1.2)
Bilirubin, Direct: 0.09 mg/dL (ref 0.00–0.40)
TOTAL PROTEIN: 6.4 g/dL (ref 6.0–8.5)

## 2018-04-23 LAB — LIPID PANEL
CHOLESTEROL TOTAL: 201 mg/dL — AB (ref 100–199)
Chol/HDL Ratio: 2.8 ratio (ref 0.0–5.0)
HDL: 71 mg/dL (ref >39)
LDL CALC: 116 mg/dL — AB (ref 0–99)
Triglycerides: 70 mg/dL (ref 0–149)
VLDL CHOLESTEROL CAL: 14 mg/dL (ref 5–40)

## 2018-04-23 LAB — BMP8+EGFR
BUN/Creatinine Ratio: 10 (ref 10–24)
BUN: 11 mg/dL (ref 8–27)
CALCIUM: 9.9 mg/dL (ref 8.6–10.2)
CO2: 22 mmol/L (ref 20–29)
Chloride: 96 mmol/L (ref 96–106)
Creatinine, Ser: 1.05 mg/dL (ref 0.76–1.27)
GFR, EST AFRICAN AMERICAN: 82 mL/min/{1.73_m2} (ref >59)
GFR, EST NON AFRICAN AMERICAN: 71 mL/min/{1.73_m2} (ref >59)
Glucose: 114 mg/dL — ABNORMAL HIGH (ref 65–99)
Potassium: 4.5 mmol/L (ref 3.5–5.2)
Sodium: 137 mmol/L (ref 134–144)

## 2018-04-23 LAB — VITAMIN D 25 HYDROXY (VIT D DEFICIENCY, FRACTURES): Vit D, 25-Hydroxy: 34.9 ng/mL (ref 30.0–100.0)

## 2018-08-19 ENCOUNTER — Other Ambulatory Visit: Payer: Self-pay | Admitting: Family Medicine

## 2018-08-20 ENCOUNTER — Other Ambulatory Visit: Payer: Self-pay | Admitting: *Deleted

## 2018-08-20 MED ORDER — LORAZEPAM 0.5 MG PO TABS: 0.5000 mg | ORAL_TABLET | Freq: Two times a day (BID) | ORAL | 5 refills | Status: DC | PRN

## 2018-08-25 ENCOUNTER — Ambulatory Visit (INDEPENDENT_AMBULATORY_CARE_PROVIDER_SITE_OTHER): Payer: Medicare Other | Admitting: Family Medicine

## 2018-08-25 ENCOUNTER — Ambulatory Visit (INDEPENDENT_AMBULATORY_CARE_PROVIDER_SITE_OTHER): Payer: Medicare Other

## 2018-08-25 ENCOUNTER — Encounter: Payer: Self-pay | Admitting: Family Medicine

## 2018-08-25 VITALS — BP 132/84 | HR 113 | Temp 98.0°F | Ht 66.0 in | Wt 174.0 lb

## 2018-08-25 DIAGNOSIS — E559 Vitamin D deficiency, unspecified: Secondary | ICD-10-CM

## 2018-08-25 DIAGNOSIS — I10 Essential (primary) hypertension: Secondary | ICD-10-CM | POA: Diagnosis not present

## 2018-08-25 DIAGNOSIS — M159 Polyosteoarthritis, unspecified: Secondary | ICD-10-CM

## 2018-08-25 DIAGNOSIS — E7800 Pure hypercholesterolemia, unspecified: Secondary | ICD-10-CM

## 2018-08-25 DIAGNOSIS — M150 Primary generalized (osteo)arthritis: Secondary | ICD-10-CM

## 2018-08-25 DIAGNOSIS — R52 Pain, unspecified: Secondary | ICD-10-CM | POA: Diagnosis not present

## 2018-08-25 DIAGNOSIS — J449 Chronic obstructive pulmonary disease, unspecified: Secondary | ICD-10-CM | POA: Diagnosis not present

## 2018-08-25 DIAGNOSIS — N400 Enlarged prostate without lower urinary tract symptoms: Secondary | ICD-10-CM

## 2018-08-25 MED ORDER — HYDROCODONE-ACETAMINOPHEN 5-325 MG PO TABS: 1.0000 | ORAL_TABLET | Freq: Four times a day (QID) | ORAL | 0 refills | Status: DC | PRN

## 2018-08-25 NOTE — Addendum Note (Signed)
Addended by: Zannie Cove on: 08/25/2018 12:11 PM   Modules accepted: Orders

## 2018-08-25 NOTE — Addendum Note (Signed)
Addended by: Chipper Herb on: 08/25/2018 12:40 PM   Modules accepted: Orders

## 2018-08-25 NOTE — Progress Notes (Signed)
Subjective:    Patient ID: Joshua Bowman, male    DOB: 05/22/1946, 72 y.o.   MRN: [id number]  HPI Pt here for follow up and management of chronic medical problems which includes hypertension and hyperlipidemia. He is taking medication regularly.  The patient today complains of ongoing right shoulder pain.  He is also having more trouble sleeping and also is having some panic attacks.  He is requesting refills on his pain medicine.  His blood pressure was elevated on both occasions today and the last reading was 141/95.  Generally speaking his blood pressures at home are good and elevated here in the office.  The readings he brings in from the outside are all good and I will be scanned into the record.  The patient is currently taking lorazepam and hydrocodone acetaminophen 5-3 25.  He is on vitamin D for 5000 units and taking losartan 50 mg for his blood pressure.  He has a Symbicort inhaler that he uses and he is taking Zetia.  He has been intolerant to atorvastatin in the past.  The patient denies any chest pain pressure or tightness.  He does have some shortness of breath with exertion and feels like he is just out of shape.  He also complains of some reflux symptoms at times and we discussed taking Pepcid AC for this.  He denies any change in bowel habits nausea vomiting diarrhea blood in the stool or black tarry bowel movements and says he is passing his water well.  Unfortunately he is still drinking some alcohol and drinking a lot of caffeine every day between coffee and diet Cokes.  We did tell him that this could make him more stressed and more anxious.  He says he feels like he has panic attacks when he goes out and gets in crowds of people.  He has lorazepam that he uses at home and may take 1 pill a day or sometimes 2 pills a day.  I did remind him today that because of his ongoing back and knee pain that he needed to try to take as little narcotic as possible.     Patient Active Problem  List   Diagnosis Date Noted  . COPD (chronic obstructive pulmonary disease) ([place]) 06/20/2016  . Coronary artery calcification 02/07/2015  . BPH (benign prostatic hyperplasia) 12/06/2013  . Metabolic syndrome 25/36/6440  . Carotid artery stenosis 12/06/2013  . Abnormal chest CT 08/17/2013  . Hypertension 03/30/2013  . Degenerative arthritis 03/30/2013  . Hyperlipidemia 03/30/2013  . Personal history of adenomatous colonic polyps - attenuated Polyposis syndrome 05/06/2012   Outpatient Encounter Medications as of 08/25/2018  Medication Sig  . albuterol (PROAIR HFA) 108 (90 BASE) MCG/ACT inhaler INHALE ONE OR TWO PUFFS BY MOUTH FOUR TIMES DAILY AS NEEDED  . aspirin 325 MG tablet Take 325 mg by mouth daily.  . Cholecalciferol (VITAMIN D3) 5000 UNITS CAPS Take 1 tablet by mouth. 2-3 x week  . diclofenac sodium (VOLTAREN) 1 % GEL Apply 4 g topically 2 (two) times daily.  Marland Kitchen HYDROcodone-acetaminophen (NORCO/VICODIN) 5-325 MG tablet Take 1 tablet by mouth every 6 (six) hours as needed for moderate pain.  Marland Kitchen HYDROcodone-acetaminophen (NORCO/VICODIN) 5-325 MG tablet Take 1 tablet by mouth every 6 (six) hours as needed for moderate pain.  Marland Kitchen HYDROcodone-acetaminophen (NORCO/VICODIN) 5-325 MG tablet Take 1 tablet by mouth every 6 (six) hours as needed for moderate pain.  Marland Kitchen LORazepam (ATIVAN) 0.5 MG tablet Take 1 tablet (0.5 mg total) by mouth 2 (  two) times daily as needed.  Marland Kitchen losartan (COZAAR) 50 MG tablet TAKE 1 TABLET (50 MG TOTAL) BY MOUTH DAILY.  . SYMBICORT 160-4.5 MCG/ACT inhaler INHALE 2 PUFFS BY MOUTH INTO THE LUNGS TWICE A DAY (RINSE MOUTH AFTER EACH USE)  . ZETIA 10 MG tablet TAKE 0.5 TABLETS (5 MG TOTAL) BY MOUTH DAILY.   No facility-administered encounter medications on file as of 08/25/2018.      Review of Systems  Constitutional: Negative.        Trouble sleeping   HENT: Negative.   Eyes: Negative.   Respiratory: Negative.   Cardiovascular: Negative.   Gastrointestinal: Negative.     Endocrine: Negative.   Genitourinary: Negative.   Musculoskeletal: Positive for arthralgias (rck right shoulder pain ).  Skin: Negative.   Allergic/Immunologic: Negative.   Neurological: Negative.   Hematological: Negative.   Psychiatric/Behavioral: Negative.        Occasional panic attacks        Objective:   Physical Exam  Constitutional: He is oriented to person, place, and time. He appears well-developed and well-nourished. No distress.  The patient is pleasant and calm despite his excessive caffeine intake which is causing insomnia and probably playing a role with panic attacks.  This was discussed with the patient during the visit.  HENT:  Head: Normocephalic and atraumatic.  Right Ear: External ear normal.  Left Ear: External ear normal.  Mouth/Throat: Oropharynx is clear and moist. No oropharyngeal exudate.  Nasal turbinate congestion bilaterally  Eyes: Pupils are equal, round, and reactive to light. Conjunctivae and EOM are normal. Right eye exhibits no discharge. Left eye exhibits no discharge. No scleral icterus.  Patient is past due getting an eye exam  Neck: Normal range of motion. Neck supple. No thyromegaly present.  No bruits thyromegaly or anterior cervical adenopathy  Cardiovascular: Normal rate, regular rhythm, normal heart sounds and intact distal pulses.  No murmur heard. The heart rate was 96/min.  Good pedal pulses.  Pulmonary/Chest: Effort normal. No respiratory distress. He has no wheezes. He has no rales.  Slightly tight cough.  Patient is using Symbicort once or twice a day.  I told him he should use it more frequently and use the rescue inhaler as needed and reduce his cigarette smoking is much as possible.  Abdominal: Soft. Bowel sounds are normal. He exhibits no mass. There is no tenderness.  No liver or spleen enlargement.  No epigastric tenderness.  No masses.  No inguinal adenopathy.  Musculoskeletal: Normal range of motion. He exhibits no edema or  tenderness.  Patient has some stiffness with the right arm and shoulder back and both knees.  Lymphadenopathy:    He has no cervical adenopathy.  Neurological: He is alert and oriented to person, place, and time. He has normal reflexes. No cranial nerve deficit.  Reflexes are equal bilaterally  Skin: Skin is warm and dry. No rash noted.  Psychiatric: He has a normal mood and affect. His behavior is normal. Judgment and thought content normal.  The patient's mood affect and behavior are normal for him.  Nursing note and vitals reviewed.  BP (!) 159/90 (BP Location: Left Arm)   Pulse (!) 113   Temp 98 F (36.7 C) (Oral)   Ht '5\' 6"'  (1.676 m)   Wt 174 lb (78.9 kg)   BMI 28.08 kg/m         Assessment & Plan:  1. Vitamin D deficiency -Continue current treatment pending results of lab work - CBC with  Differential/Platelet - VITAMIN D 25 Hydroxy (Vit-D Deficiency, Fractures)  2. Essential hypertension -Repeat blood pressure the third time was better.  Patient brings in normal readings from home. - BMP8+EGFR - CBC with Differential/Platelet - Hepatic function panel - DG Chest 2 View; Future  3. Pure hypercholesterolemia -Continue current treatment and as aggressive therapeutic lifestyle changes as possible which includes Zetia currently. - CBC with Differential/Platelet - Lipid panel - DG Chest 2 View; Future  4. Benign prostatic hyperplasia, unspecified whether lower urinary tract symptoms present -No complaints today with voiding - CBC with Differential/Platelet  5. Pain management -Pain medicines refilled today and patient was encouraged to take as little as possible - CBC with Differential/Platelet  6. Primary osteoarthritis involving multiple joints - HYDROcodone-acetaminophen (NORCO/VICODIN) 5-325 MG tablet; Take 1 tablet by mouth every 6 (six) hours as needed for moderate pain.  Dispense: 120 tablet; Refill: 0 - HYDROcodone-acetaminophen (NORCO/VICODIN) 5-325 MG  tablet; Take 1 tablet by mouth every 6 (six) hours as needed for moderate pain.  Dispense: 120 tablet; Refill: 0 - HYDROcodone-acetaminophen (NORCO/VICODIN) 5-325 MG tablet; Take 1 tablet by mouth every 6 (six) hours as needed for moderate pain.  Dispense: 120 tablet; Refill: 0  Meds ordered this encounter  Medications  . HYDROcodone-acetaminophen (NORCO/VICODIN) 5-325 MG tablet    Sig: Take 1 tablet by mouth every 6 (six) hours as needed for moderate pain.    Dispense:  120 tablet    Refill:  0    Fill 30 days after last rx  . HYDROcodone-acetaminophen (NORCO/VICODIN) 5-325 MG tablet    Sig: Take 1 tablet by mouth every 6 (six) hours as needed for moderate pain.    Dispense:  120 tablet    Refill:  0    Fill 30 after last rx  . HYDROcodone-acetaminophen (NORCO/VICODIN) 5-325 MG tablet    Sig: Take 1 tablet by mouth every 6 (six) hours as needed for moderate pain.    Dispense:  120 tablet    Refill:  0    Fill 30 days after last rx   Patient Instructions                       Medicare Annual Wellness Visit  [facility] and the medical providers at [facility] strive to bring you the best medical care.  In doing so we not only want to address your current medical conditions and concerns but also to detect new conditions early and prevent illness, disease and health-related problems.    Medicare offers a yearly Wellness Visit which allows our clinical staff to assess your need for preventative services including immunizations, lifestyle education, counseling to decrease risk of preventable diseases and screening for fall risk and other medical concerns.    This visit is provided free of charge (no copay) for all Medicare recipients. The clinical pharmacists at [facility] have begun to conduct these Wellness Visits which will also include a thorough review of all your medications.    As you primary medical provider recommend that you make an  appointment for your Annual Wellness Visit if you have not done so already this year.  You may set up this appointment before you leave today or you may call back ([phone]) and schedule an appointment.  Please make sure when you call that you mention that you are scheduling your Annual Wellness Visit with the clinical pharmacist so that the appointment may be made for the proper length of  time.     Continue current medications. Continue good therapeutic lifestyle changes which include good diet and exercise. Fall precautions discussed with patient. If an FOBT was given today- please return it to our front desk. If you are over 98 years old - you may need Prevnar 44 or the adult Pneumonia vaccine.  **Flu shots are available--- please call and schedule a FLU-CLINIC appointment**  After your visit with [place] today you will receive a survey in the mail or online from [employer] regarding your care with [place]. Please take a moment to fill this out. Your feedback is very important to [place] as you can help [place] better understand your patient needs as well as improve your experience and satisfaction. WE CARE ABOUT YOU!!!   Make every effort to reduce caffeine intake as much as possible as this could be increasing anxiety and causing you not to rest as well. Take Pepcid AC for reflux this could be taken twice daily before breakfast and supper Follow-up and get colonoscopy as planned by Dr. Carlean Purl early in 2020. Remember that reducing caffeine intake could help with sleeping. And as mentioned, it could also help with panic attacks. If you have not had an eye exam recently it would probably be a good idea to schedule a good eye exam. Reduce caffeine intake!!!!! Patient is in need of an eye exam. He should keep the house as cool as possible and use nasal saline frequently in each nostril along with nasal saline gel.  Arrie Senate MD

## 2018-08-25 NOTE — Patient Instructions (Addendum)
Medicare Annual Wellness Visit  [facility] and the medical providers at [facility] strive to bring you the best medical care.  In doing so we not only want to address your current medical conditions and concerns but also to detect new conditions early and prevent illness, disease and health-related problems.    Medicare offers a yearly Wellness Visit which allows our clinical staff to assess your need for preventative services including immunizations, lifestyle education, counseling to decrease risk of preventable diseases and screening for fall risk and other medical concerns.    This visit is provided free of charge (no copay) for all Medicare recipients. The clinical pharmacists at [facility] have begun to conduct these Wellness Visits which will also include a thorough review of all your medications.    As you primary medical provider recommend that you make an appointment for your Annual Wellness Visit if you have not done so already this year.  You may set up this appointment before you leave today or you may call back ([phone]) and schedule an appointment.  Please make sure when you call that you mention that you are scheduling your Annual Wellness Visit with the clinical pharmacist so that the appointment may be made for the proper length of time.     Continue current medications. Continue good therapeutic lifestyle changes which include good diet and exercise. Fall precautions discussed with patient. If an FOBT was given today- please return it to our front desk. If you are over 72 years old - you may need Prevnar 53 or the adult Pneumonia vaccine.  **Flu shots are available--- please call and schedule a FLU-CLINIC appointment**  After your visit with [place] today you will receive a survey in the mail or online from [employer] regarding your care with [place]. Please take a moment to fill this out. Your feedback is very  important to [place] as you can help [place] better understand your patient needs as well as improve your experience and satisfaction. WE CARE ABOUT YOU!!!   Make every effort to reduce caffeine intake as much as possible as this could be increasing anxiety and causing you not to rest as well. Take Pepcid AC for reflux this could be taken twice daily before breakfast and supper Follow-up and get colonoscopy as planned by Dr. Carlean Purl early in 2020. Remember that reducing caffeine intake could help with sleeping. And as mentioned, it could also help with panic attacks. If you have not had an eye exam recently it would probably be a good idea to schedule a good eye exam. Reduce caffeine intake!!!!! Patient is in need of an eye exam. He should keep the house as cool as possible and use nasal saline frequently in each nostril along with nasal saline gel.

## 2018-08-26 LAB — LIPID PANEL
CHOLESTEROL TOTAL: 223 mg/dL — AB (ref 100–199)
Chol/HDL Ratio: 2.3 ratio (ref 0.0–5.0)
HDL: 98 mg/dL (ref >39)
LDL Calculated: 112 mg/dL — ABNORMAL HIGH (ref 0–99)
Triglycerides: 64 mg/dL (ref 0–149)
VLDL CHOLESTEROL CAL: 13 mg/dL (ref 5–40)

## 2018-08-26 LAB — BMP8+EGFR
BUN/Creatinine Ratio: 10 (ref 10–24)
BUN: 11 mg/dL (ref 8–27)
CALCIUM: 10.2 mg/dL (ref 8.6–10.2)
CHLORIDE: 95 mmol/L — AB (ref 96–106)
CO2: 22 mmol/L (ref 20–29)
Creatinine, Ser: 1.08 mg/dL (ref 0.76–1.27)
GFR calc Af Amer: 79 mL/min/{1.73_m2} (ref >59)
GFR calc non Af Amer: 68 mL/min/{1.73_m2} (ref >59)
Glucose: 95 mg/dL (ref 65–99)
POTASSIUM: 4.3 mmol/L (ref 3.5–5.2)
Sodium: 138 mmol/L (ref 134–144)

## 2018-08-26 LAB — VITAMIN D 25 HYDROXY (VIT D DEFICIENCY, FRACTURES): Vit D, 25-Hydroxy: 33.8 ng/mL (ref 30.0–100.0)

## 2018-08-26 LAB — HEPATIC FUNCTION PANEL
ALT: 18 IU/L (ref 0–44)
AST: 22 IU/L (ref 0–40)
Albumin: 4.5 g/dL (ref 3.5–4.8)
Alkaline Phosphatase: 54 IU/L (ref 39–117)
BILIRUBIN, DIRECT: 0.11 mg/dL (ref 0.00–0.40)
Bilirubin Total: 0.4 mg/dL (ref 0.0–1.2)
TOTAL PROTEIN: 6.8 g/dL (ref 6.0–8.5)

## 2018-08-26 LAB — CBC WITH DIFFERENTIAL/PLATELET
BASOS ABS: 0.1 10*3/uL (ref 0.0–0.2)
Basos: 1 %
EOS (ABSOLUTE): 0 10*3/uL (ref 0.0–0.4)
Eos: 0 %
Hematocrit: 53 % — ABNORMAL HIGH (ref 37.5–51.0)
Hemoglobin: 17.9 g/dL — ABNORMAL HIGH (ref 13.0–17.7)
IMMATURE GRANS (ABS): 0 10*3/uL (ref 0.0–0.1)
IMMATURE GRANULOCYTES: 0 %
LYMPHS: 15 %
Lymphocytes Absolute: 1.7 10*3/uL (ref 0.7–3.1)
MCH: 31.7 pg (ref 26.6–33.0)
MCHC: 33.8 g/dL (ref 31.5–35.7)
MCV: 94 fL (ref 79–97)
Monocytes Absolute: 0.8 10*3/uL (ref 0.1–0.9)
Monocytes: 7 %
NEUTROS PCT: 77 %
Neutrophils Absolute: 8.5 10*3/uL — ABNORMAL HIGH (ref 1.4–7.0)
PLATELETS: 286 10*3/uL (ref 150–450)
RBC: 5.65 x10E6/uL (ref 4.14–5.80)
RDW: 13.2 % (ref 12.3–15.4)
WBC: 11.1 10*3/uL — ABNORMAL HIGH (ref 3.4–10.8)

## 2018-09-01 ENCOUNTER — Other Ambulatory Visit: Payer: Medicare Other

## 2018-09-01 DIAGNOSIS — Z1211 Encounter for screening for malignant neoplasm of colon: Secondary | ICD-10-CM

## 2018-09-03 LAB — FECAL OCCULT BLOOD, IMMUNOCHEMICAL: Fecal Occult Bld: POSITIVE — AB

## 2018-09-06 NOTE — Progress Notes (Signed)
He is due for a colonoscopy 10/2018 anyway due to numerous polyps 10/2017  We can arrange for that for early 2020 without a visit to see me

## 2018-12-21 ENCOUNTER — Other Ambulatory Visit: Payer: Self-pay | Admitting: *Deleted

## 2018-12-21 DIAGNOSIS — M150 Primary generalized (osteo)arthritis: Principal | ICD-10-CM

## 2018-12-21 DIAGNOSIS — M159 Polyosteoarthritis, unspecified: Secondary | ICD-10-CM

## 2018-12-21 MED ORDER — HYDROCODONE-ACETAMINOPHEN 5-325 MG PO TABS: 1.0000 | ORAL_TABLET | Freq: Four times a day (QID) | ORAL | 0 refills | Status: DC | PRN

## 2018-12-23 ENCOUNTER — Other Ambulatory Visit: Payer: Self-pay | Admitting: Family Medicine

## 2018-12-28 ENCOUNTER — Encounter: Payer: Self-pay | Admitting: Internal Medicine

## 2018-12-29 ENCOUNTER — Ambulatory Visit: Payer: Medicare Other | Admitting: Family Medicine

## 2019-02-14 ENCOUNTER — Other Ambulatory Visit: Payer: Self-pay | Admitting: Family Medicine

## 2019-02-16 ENCOUNTER — Ambulatory Visit (INDEPENDENT_AMBULATORY_CARE_PROVIDER_SITE_OTHER): Payer: Medicare Other | Admitting: *Deleted

## 2019-02-16 ENCOUNTER — Other Ambulatory Visit: Payer: Self-pay

## 2019-02-16 ENCOUNTER — Other Ambulatory Visit: Payer: Self-pay | Admitting: *Deleted

## 2019-02-16 VITALS — BP 127/73 | HR 88 | Ht 66.0 in | Wt 167.0 lb

## 2019-02-16 DIAGNOSIS — Z0000 Encounter for general adult medical examination without abnormal findings: Secondary | ICD-10-CM

## 2019-02-16 DIAGNOSIS — M159 Polyosteoarthritis, unspecified: Secondary | ICD-10-CM

## 2019-02-16 NOTE — Progress Notes (Signed)
MEDICARE ANNUAL WELLNESS VISIT  02/16/2019  Telephone Visit Disclaimer This Medicare AWV was conducted by telephone due to national recommendations for restrictions regarding the COVID-19 Pandemic (e.g. social distancing).  I verified, using two identifiers, that I am speaking with Joshua Bowman or their authorized healthcare agent. I discussed the limitations, risks, security, and privacy concerns of performing an evaluation and management service by telephone and the potential availability of an in-person appointment in the future. The patient expressed understanding and agreed to proceed.   Subjective:  Joshua Bowman is a 73 y.o. male patient of Chipper Herb, MD who had a Medicare Annual Wellness Visit today via telephone. Joshua Bowman is Retired and lives with their spouse. he has 1 children. he reports that he is socially active and does interact with friends/family regularly. he is minimally physically active and enjoys gardening.  Patient Care Team: Chipper Herb, MD as PCP - General (Family Medicine) Minus Breeding, MD as Consulting Physician (Cardiology) Susa Day, MD as Consulting Physician (Orthopedic Surgery) Gatha Mayer, MD as Consulting Physician (Gastroenterology) Harlen Labs, MD as Referring Physician (Optometry) Hayden Pedro, MD as Consulting Physician (Ophthalmology)  Advanced Directives 02/16/2019 11/19/2017  Does Patient Have a Medical Advance Directive? No No  Would patient like information on creating a medical advance directive? No - Patient declined No - Patient declined    Hospital Utilization Over the Past 12 Months: # of hospitalizations or ER visits: 0 # of surgeries: 0  Review of Systems    Patient reports that his overall health is better compared to last year.  Patient Reported Readings (BP, Pulse, CBG, Weight, etc) BP 127/73 Comment: home reading  Pulse 88   Ht 5\' 6"  (1.676 m)   Wt 167 lb (75.8 kg)   BMI 26.95 kg/m     Review of Systems: General ROS: negative  All other systems negative.  Pain Assessment       Current Medications & Allergies (verified) Allergies as of 02/16/2019      Reactions   Lipitor [atorvastatin] Other (See Comments)   myalgias   Sulfa Antibiotics Itching, Rash      Medication List       Accurate as of Feb 16, 2019  4:01 PM. If you have any questions, ask your nurse or doctor.        STOP taking these medications   Zetia 10 MG tablet Generic drug:  ezetimibe     TAKE these medications   albuterol 108 (90 Base) MCG/ACT inhaler Commonly known as:  ProAir HFA INHALE ONE OR TWO PUFFS BY MOUTH FOUR TIMES DAILY AS NEEDED   aspirin 325 MG tablet Take 325 mg by mouth daily.   diclofenac sodium 1 % Gel Commonly known as:  Voltaren Apply 4 g topically 2 (two) times daily.   HYDROcodone-acetaminophen 5-325 MG tablet Commonly known as:  NORCO/VICODIN Take 1 tablet by mouth every 6 (six) hours as needed for moderate pain.   HYDROcodone-acetaminophen 5-325 MG tablet Commonly known as:  NORCO/VICODIN Take 1 tablet by mouth every 6 (six) hours as needed for moderate pain.   HYDROcodone-acetaminophen 5-325 MG tablet Commonly known as:  NORCO/VICODIN Take 1 tablet by mouth every 6 (six) hours as needed for moderate pain.   LORazepam 0.5 MG tablet Commonly known as:  ATIVAN Take 1 tablet (0.5 mg total) by mouth 2 (two) times daily as needed.   losartan 50 MG tablet Commonly known as:  COZAAR TAKE 1  TABLET (50 MG TOTAL) BY MOUTH DAILY.   Symbicort 160-4.5 MCG/ACT inhaler Generic drug:  budesonide-formoterol INHALE 2 PUFFS BY MOUTH INTO THE LUNGS TWICE A DAY (RINSE MOUTH AFTER EACH USE)   Vitamin D3 125 MCG (5000 UT) Caps Take 1 tablet by mouth. 2-3 x week       History (reviewed): Past Medical History:  Diagnosis Date  . Anxiety   . Arthritis   . Bilateral carotid artery stenosis    Minimal 2012   . Colon polyps - attenuated polyposis syndrome     multiple   . COPD (chronic obstructive pulmonary disease) ([place])   . Duodenitis   . Gastritis   . Hemorrhoids   . Hyperlipidemia   . Hyperplasia of prostate   . IBS (irritable bowel syndrome)   . Pre-diabetes   . Sigmoid diverticulosis    mild    Past Surgical History:  Procedure Laterality Date  . APPENDECTOMY  1969  . COLONOSCOPY    . ESOPHAGOGASTRODUODENOSCOPY    . EYE SURGERY     laser - left eye - Dr Zigmund Daniel  . lipoma rt shoulder  01/2001   outpatient   . nasal surgery ( spurs )  70's  . rt. shoulder -rotator cuff repair  08/2005   Dr. Maxie Better   . TONSILLECTOMY     Family History  Problem Relation Age of Onset  . Heart disease Mother        Atrial fib  . Prostate cancer Brother   . Heart disease Brother   . Congestive Heart Failure Father   . Post-traumatic stress disorder Father   . Arthritis Sister        back issues   . Heart attack Brother    Social History   Socioeconomic History  . Marital status: Married    Spouse name: Joshua Bowman   . Number of children: 1  . Years of education: Not on file  . Highest education level: Not on file  Occupational History  . 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 Financial resource strain: Not on file  . Food insecurity:    Worry: Not on file    Inability: Not on file  . Transportation needs:    Medical: Not on file    Non-medical: Not on file  Tobacco Use  . Smoking status: Current Every Day Smoker    Packs/day: 0.50    Types: Cigarettes    Start date: 09/29/1980  . Smokeless tobacco: Former Network engineer and Sexual Activity  . Alcohol use: No  . Drug use: No  . Sexual activity: Not on file  Lifestyle  . Physical activity:    Days per week: Not on file    Minutes per session: Not on file  . Stress: Not on file  Relationships  . Social connections:    Talks on phone: Not on file    Gets together: Not on file    Attends religious service: Not on file    Active  member of club or organization: Not on file    Attends meetings of clubs or organizations: Not on file    Relationship status: Not on file  Other Topics Concern  . Not on file  Social History Narrative   Retired from a nursing home.  Lives with second wife.     Activities of Daily Living In your present state of health, do you have any difficulty performing the following  activities: 02/16/2019  Hearing? N  Vision? N  Comment reading glasses   Walking or climbing stairs? N  Dressing or bathing? Y  Comment knee  Doing errands, shopping? N  Preparing Food and eating ? N  Using the Toilet? N  In the past six months, have you accidently leaked urine? N  Do you have problems with loss of bowel control? N  Managing your Medications? N  Managing your Finances? N  Housekeeping or managing your Housekeeping? N  Some recent data might be hidden    Patient Literacy    Exercise Current Exercise Habits: Home exercise routine, Type of exercise: walking, Time (Minutes): 30, Frequency (Times/Week): 4, Weekly Exercise (Minutes/Week): 120, Intensity: Mild  Diet Patient reports consuming 2 meals a day and 1 snack(s) a day Patient reports that his primary diet is: Regular Patient reports that she does not have regular access to food.   Depression Screen PHQ 2/9 Scores 02/16/2019 08/25/2018 04/22/2018 12/03/2017 10/13/2017 09/01/2017 08/04/2017  PHQ - 2 Score 1 2 1  0 0 1 1  PHQ- 9 Score - 6 - - - - -     Fall Risk Fall Risk  02/16/2019 08/25/2018 04/22/2018 12/03/2017 10/13/2017  Falls in the past year? 0 0 No No No     Objective:  ADDEN STROUT seemed alert and oriented and he participated appropriately during our telephone visit.  Blood Pressure Weight BMI  BP Readings from Last 3 Encounters:  02/16/19 127/73  08/25/18 132/84  04/22/18 (!) 134/91   Wt Readings from Last 3 Encounters:  02/16/19 167 lb (75.8 kg)  08/25/18 174 lb (78.9 kg)  04/22/18 173 lb (78.5 kg)   BMI Readings from  Last 1 Encounters:  02/16/19 26.95 kg/m    *Unable to obtain current vital signs, weight, and BMI due to telephone visit type  Hearing/Vision  . Joshua Bowman did not seem to have difficulty with hearing/understanding during the telephone conversation . Reports that he has had a formal eye exam by an eye care professional within the past year . Reports that he has not had a formal hearing evaluation within the past year *Unable to fully assess hearing and vision during telephone visit type  Cognitive Function: 6CIT Screen 02/16/2019  What Year? 0 points  What month? 0 points  What time? 0 points  Count back from 20 0 points  Months in reverse 0 points  Repeat phrase 0 points  Total Score 0    Normal Cognitive Function Screening: Yes (Normal:0-7, Significant for Dysfunction: >8)  Immunization & Health Maintenance Record Immunization History  Administered Date(s) Administered  . Influenza Whole 06/20/2015  . Influenza, High Dose Seasonal PF 06/20/2016, 07/22/2018  . Influenza,inj,Quad PF,6+ Mos 08/09/2014, 08/04/2017  . Pneumococcal Conjugate-13 12/06/2013  . Td 12/03/2017    Health Maintenance  Topic Date Due  . PNA vac Low Risk Adult (2 of 2 - PPSV23) 12/07/2014  . COLONOSCOPY  11/19/2018  . INFLUENZA VACCINE  04/30/2019  . TETANUS/TDAP  12/04/2027  . Hepatitis C Screening  Completed       Assessment  This is a routine wellness examination for Joshua Bowman.  Health Maintenance: Due or Overdue Health Maintenance Due  Topic Date Due  . PNA vac Low Risk Adult (2 of 2 - PPSV23) 12/07/2014  . COLONOSCOPY  11/19/2018    Joshua Bowman does not need a referral for Community Assistance: Care Management:   no Social Work:    no Prescription Assistance:  No  Nutrition/Diabetes Education:  no   Plan:  Personalized Goals Goals Addressed            This Visit's Progress   . Quit Smoking       Increase walking and get more active       Personalized Health  Maintenance & Screening Recommendations  Pneumococcal vaccine 23  Lung Cancer Screening Recommended: no (Low Dose CT Chest recommended if Age 39-80 years, 30 pack-year currently smoking OR have quit w/in past 15 years) Hepatitis C Screening recommended: no HIV Screening recommended: no  Advanced Directives: Written information was not prepared per patient's request.  Referrals & Orders No orders of the defined types were placed in this encounter.   Follow-up Plan . Follow-up with Chipper Herb, MD as planned    I have personally reviewed and noted the following in the patient's chart:   . Medical and social history . Use of alcohol, tobacco or illicit drugs  . Current medications and supplements . Functional ability and status . Nutritional status . Physical activity . Advanced directives . List of other physicians . Hospitalizations, surgeries, and ER visits in previous 12 months . Vitals . Screenings to include cognitive, depression, and falls . Referrals and appointments  In addition, I have reviewed and discussed with Joshua Bowman certain preventive protocols, quality metrics, and best practice recommendations. A written personalized care plan for preventive services as well as general preventive health recommendations is available and can be mailed to the patient at his request.      Zannie Cove, LPN   [id number]

## 2019-02-17 MED ORDER — LORAZEPAM 0.5 MG PO TABS: 0.5000 mg | ORAL_TABLET | Freq: Two times a day (BID) | ORAL | 2 refills | Status: DC | PRN

## 2019-02-17 MED ORDER — HYDROCODONE-ACETAMINOPHEN 5-325 MG PO TABS: 1.0000 | ORAL_TABLET | Freq: Four times a day (QID) | ORAL | 0 refills | Status: DC | PRN

## 2019-03-25 ENCOUNTER — Other Ambulatory Visit: Payer: Self-pay | Admitting: Family Medicine

## 2019-03-25 DIAGNOSIS — M159 Polyosteoarthritis, unspecified: Secondary | ICD-10-CM

## 2019-03-25 MED ORDER — HYDROCODONE-ACETAMINOPHEN 5-325 MG PO TABS: 1.0000 | ORAL_TABLET | Freq: Four times a day (QID) | ORAL | 0 refills | Status: DC | PRN

## 2019-04-15 ENCOUNTER — Encounter: Payer: Self-pay | Admitting: *Deleted

## 2019-04-21 ENCOUNTER — Other Ambulatory Visit: Payer: Self-pay | Admitting: Family Medicine

## 2019-05-03 ENCOUNTER — Other Ambulatory Visit: Payer: Self-pay

## 2019-05-04 ENCOUNTER — Ambulatory Visit: Payer: Medicare Other | Admitting: Family Medicine

## 2019-05-04 ENCOUNTER — Ambulatory Visit (INDEPENDENT_AMBULATORY_CARE_PROVIDER_SITE_OTHER): Payer: Medicare Other | Admitting: Family Medicine

## 2019-05-04 ENCOUNTER — Encounter: Payer: Self-pay | Admitting: Family Medicine

## 2019-05-04 VITALS — BP 138/74 | HR 100 | Temp 99.3°F | Ht 66.0 in | Wt 169.0 lb

## 2019-05-04 DIAGNOSIS — I10 Essential (primary) hypertension: Secondary | ICD-10-CM | POA: Diagnosis not present

## 2019-05-04 DIAGNOSIS — G8929 Other chronic pain: Secondary | ICD-10-CM | POA: Insufficient documentation

## 2019-05-04 DIAGNOSIS — F418 Other specified anxiety disorders: Secondary | ICD-10-CM

## 2019-05-04 DIAGNOSIS — R52 Pain, unspecified: Secondary | ICD-10-CM

## 2019-05-04 DIAGNOSIS — E559 Vitamin D deficiency, unspecified: Secondary | ICD-10-CM

## 2019-05-04 DIAGNOSIS — E7800 Pure hypercholesterolemia, unspecified: Secondary | ICD-10-CM

## 2019-05-04 DIAGNOSIS — M25511 Pain in right shoulder: Secondary | ICD-10-CM

## 2019-05-04 MED ORDER — LOSARTAN POTASSIUM 50 MG PO TABS: 50.0000 mg | ORAL_TABLET | Freq: Every day | ORAL | 6 refills | Status: DC

## 2019-05-04 MED ORDER — FLUOXETINE HCL 20 MG PO CAPS: 20.0000 mg | ORAL_CAPSULE | Freq: Every day | ORAL | 3 refills | Status: DC

## 2019-05-04 MED ORDER — METHYLPREDNISOLONE ACETATE 80 MG/ML IJ SUSP
60.0000 mg | Freq: Once | INTRAMUSCULAR | Status: AC
  Administered 2019-05-04: 60 mg via INTRA_ARTICULAR

## 2019-05-04 NOTE — Progress Notes (Signed)
Subjective:  Patient ID: Joshua Bowman, male    DOB: [birth year], 73 y.o.   MRN: [id number]  Patient Care Team: Baruch Gouty, FNP as PCP - General (Family Medicine) Minus Breeding, MD as Consulting Physician (Cardiology) Susa Day, MD as Consulting Physician (Orthopedic Surgery) Gatha Mayer, MD as Consulting Physician (Gastroenterology) Harlen Labs, MD as Referring Physician (Optometry) Hayden Pedro, MD as Consulting Physician (Ophthalmology)   Chief Complaint:  Medical Management of Chronic Issues, Hyperlipidemia, Hypertension, and pain mgmt   HPI: Joshua Bowman is a 73 y.o. male presenting on 05/04/2019 for Medical Management of Chronic Issues, Hyperlipidemia, Hypertension, and pain mgmt   Pt presents today for follow up of chronic medical conditions. Pt states he has been having increasing right shoulder pain. No new injury.   Hyperlipidemia This is a chronic problem. The current episode started more than 1 year ago. The problem is uncontrolled. Recent lipid tests were reviewed and are high. Pertinent negatives include no chest pain, focal sensory loss, focal weakness, leg pain, myalgias or shortness of breath. Current antihyperlipidemic treatment includes diet change. The current treatment provides no improvement of lipids. Compliance problems include medication side effects.  Risk factors for coronary artery disease include hypertension, male sex and dyslipidemia.  Hypertension This is a chronic problem. The current episode started more than 1 year ago. The problem is controlled. Associated symptoms include anxiety. Pertinent negatives include no blurred vision, chest pain, headaches, malaise/fatigue, neck pain, orthopnea, palpitations, peripheral edema, PND, shortness of breath or sweats. There are no associated agents to hypertension. Risk factors for coronary artery disease include dyslipidemia, male gender and stress. Past treatments include angiotensin  blockers. The current treatment provides significant improvement. There are no compliance problems.  There is no history of angina, kidney disease, CAD/MI, CVA, heart failure, left ventricular hypertrophy, PVD or retinopathy.  Shoulder Pain  The pain is present in the right shoulder. This is a chronic problem. The current episode started more than 1 year ago. There has been no history of extremity trauma. The problem occurs daily. The problem has been waxing and waning. The pain is at a severity of 6/10. The pain is moderate. Associated symptoms include a limited range of motion and stiffness. Pertinent negatives include no fever, inability to bear weight, itching, joint locking, joint swelling, numbness or tingling. The symptoms are aggravated by activity. He has tried oral narcotics, heat and movement for the symptoms. The treatment provided moderate relief.  Anxiety Presents for follow-up visit. Symptoms include excessive worry, nervous/anxious behavior and obsessions. Patient reports no chest pain, compulsions, confusion, depressed mood, dizziness, dry mouth, feeling of choking, hyperventilation, impotence, insomnia, irritability, malaise, muscle tension, nausea, palpitations, panic, restlessness, shortness of breath or suicidal ideas. Symptoms occur most days. The severity of symptoms is moderate. The quality of sleep is good. Nighttime awakenings: occasional.   Compliance with medications is 76-100%.   GAD 7 : Generalized Anxiety Score 05/04/2019  Nervous, Anxious, on Edge 1  Control/stop worrying 1  Worry too much - different things 1  Trouble relaxing 0  Restless 0  Easily annoyed or irritable 0  Afraid - awful might happen 0  Total GAD 7 Score 3      Relevant past medical, surgical, family, and social history reviewed and updated as indicated.  Allergies and medications reviewed and updated. Date reviewed: Chart in Epic.   Past Medical History:  Diagnosis Date   Anxiety     Arthritis  Bilateral carotid artery stenosis    Minimal 2012    Colon polyps - attenuated polyposis syndrome    multiple    COPD (chronic obstructive pulmonary disease) (HCC)    Duodenitis    Gastritis    Hemorrhoids    Hyperlipidemia    Hyperplasia of prostate    IBS (irritable bowel syndrome)    Pre-diabetes    Sigmoid diverticulosis    mild     Past Surgical History:  Procedure Laterality Date   APPENDECTOMY  1969   COLONOSCOPY     ESOPHAGOGASTRODUODENOSCOPY     EYE SURGERY     laser - left eye - Dr Zigmund Daniel   lipoma rt shoulder  01/2001   outpatient    nasal surgery ( spurs )  70's   rt. shoulder -rotator cuff repair  08/2005   Dr. Maxie Better    TONSILLECTOMY      Social History   Socioeconomic History   Marital status: Married    Spouse name: dale    Number of children: 1   Years of education: Not on file   Highest education level: Not on file  Occupational History   Occupation: retired     Comment: [employer], [employer]   Social Needs   Emergency planning/management officer strain: Not on file   Food insecurity    Worry: Not on file    Inability: Not on file   Transportation needs    Medical: Not on file    Non-medical: Not on file  Tobacco Use   Smoking status: Current Every Day Smoker    Packs/day: 0.50    Types: Cigarettes    Start date: 09/29/1980   Smokeless tobacco: Former Systems developer  Substance and Sexual Activity   Alcohol use: No   Drug use: No   Sexual activity: Not on file  Lifestyle   Physical activity    Days per week: Not on file    Minutes per session: Not on file   Stress: Not on file  Relationships   Social connections    Talks on phone: Not on file    Gets together: Not on file    Attends religious service: Not on file    Active member of club or organization: Not on file    Attends meetings of clubs or organizations: Not on file    Relationship status: Not on file   Intimate partner violence    Fear of  current or ex partner: Not on file    Emotionally abused: Not on file    Physically abused: Not on file    Forced sexual activity: Not on file  Other Topics Concern   Not on file  Social History Narrative   Retired from a nursing home.  Lives with second wife.     Outpatient Encounter Medications as of 05/04/2019  Medication Sig   albuterol (PROAIR HFA) 108 (90 BASE) MCG/ACT inhaler INHALE ONE OR TWO PUFFS BY MOUTH FOUR TIMES DAILY AS NEEDED   aspirin 325 MG tablet Take 325 mg by mouth daily.   Cholecalciferol (VITAMIN D3) 5000 UNITS CAPS Take 1 tablet by mouth. 2-3 x week   diclofenac sodium (VOLTAREN) 1 % GEL Apply 4 g topically 2 (two) times daily.   HYDROcodone-acetaminophen (NORCO/VICODIN) 5-325 MG tablet Take 1 tablet by mouth every 6 (six) hours as needed for moderate pain.   LORazepam (ATIVAN) 0.5 MG tablet Take 1 tablet (0.5 mg total) by mouth 2 (two) times daily as needed.  losartan (COZAAR) 50 MG tablet Take 1 tablet (50 mg total) by mouth daily.   SYMBICORT 160-4.5 MCG/ACT inhaler INHALE 2 PUFFS BY MOUTH INTO THE LUNGS TWICE A DAY (RINSE MOUTH AFTER EACH USE)   [DISCONTINUED] losartan (COZAAR) 50 MG tablet TAKE 1 TABLET (50 MG TOTAL) BY MOUTH DAILY.   FLUoxetine (PROZAC) 20 MG capsule Take 1 capsule (20 mg total) by mouth daily.   HYDROcodone-acetaminophen (NORCO/VICODIN) 5-325 MG tablet Take 1 tablet by mouth every 6 (six) hours as needed for moderate pain. (Patient not taking: Reported on 05/04/2019)   HYDROcodone-acetaminophen (NORCO/VICODIN) 5-325 MG tablet Take 1 tablet by mouth every 6 (six) hours as needed for moderate pain. (Patient not taking: Reported on 05/04/2019)   [EXPIRED] methylPREDNISolone acetate (DEPO-MEDROL) injection 60 mg    No facility-administered encounter medications on file as of 05/04/2019.     Allergies  Allergen Reactions   Lipitor [Atorvastatin] Other (See Comments)    myalgias   Sulfa Antibiotics Itching and Rash    Review of  Systems  Constitutional: Negative for activity change, appetite change, chills, diaphoresis, fatigue, fever, irritability, malaise/fatigue and unexpected weight change.  Eyes: Negative for blurred vision, photophobia and visual disturbance.  Respiratory: Negative for cough, chest tightness and shortness of breath.   Cardiovascular: Negative for chest pain, palpitations, orthopnea, leg swelling and PND.  Gastrointestinal: Negative for abdominal pain and nausea.  Endocrine: Negative for cold intolerance, heat intolerance, polydipsia, polyphagia and polyuria.  Genitourinary: Negative for decreased urine volume, difficulty urinating and impotence.  Musculoskeletal: Positive for arthralgias and stiffness. Negative for myalgias and neck pain.  Skin: Negative for itching.  Neurological: Negative for dizziness, tingling, tremors, focal weakness, seizures, syncope, facial asymmetry, speech difficulty, weakness, light-headedness, numbness and headaches.  Psychiatric/Behavioral: Negative for agitation, behavioral problems, confusion, hallucinations, self-injury, sleep disturbance and suicidal ideas. The patient is nervous/anxious. The patient does not have insomnia and is not hyperactive.   All other systems reviewed and are negative.       Objective:  BP 138/74 (Cuff Size: Normal)    Pulse 100    Temp 99.3 F (37.4 C)    Ht '5\' 6"'$  (1.676 m)    Wt 169 lb (76.7 kg)    BMI 27.28 kg/m    Wt Readings from Last 3 Encounters:  05/04/19 169 lb (76.7 kg)  02/16/19 167 lb (75.8 kg)  08/25/18 174 lb (78.9 kg)    Physical Exam Vitals signs and nursing note reviewed.  Constitutional:      General: He is not in acute distress.    Appearance: Normal appearance. He is well-developed and well-groomed. He is not ill-appearing, toxic-appearing or diaphoretic.  HENT:     Head: Normocephalic and atraumatic.     Jaw: There is normal jaw occlusion.     Right Ear: Hearing normal.     Left Ear: Hearing normal.      Nose: Nose normal.     Mouth/Throat:     Lips: Pink.     Mouth: Mucous membranes are moist.     Pharynx: Oropharynx is clear. Uvula midline.  Eyes:     General: Lids are normal.     Extraocular Movements: Extraocular movements intact.     Conjunctiva/sclera: Conjunctivae normal.     Pupils: Pupils are equal, round, and reactive to light.  Neck:     Musculoskeletal: Normal range of motion and neck supple.     Thyroid: No thyroid mass, thyromegaly or thyroid tenderness.     Vascular: No carotid  bruit or JVD.     Trachea: Trachea and phonation normal.  Cardiovascular:     Rate and Rhythm: Normal rate and regular rhythm.     Chest Wall: PMI is not displaced.     Pulses: Normal pulses.     Heart sounds: Normal heart sounds. No murmur. No friction rub. No gallop.   Pulmonary:     Effort: Pulmonary effort is normal. No respiratory distress.     Breath sounds: Normal breath sounds. No wheezing.  Abdominal:     General: Bowel sounds are normal. There is no distension or abdominal bruit.     Palpations: Abdomen is soft. There is no hepatomegaly or splenomegaly.     Tenderness: There is no abdominal tenderness. There is no right CVA tenderness or left CVA tenderness.     Hernia: No hernia is present.  Musculoskeletal:     Right shoulder: He exhibits decreased range of motion, tenderness and pain. He exhibits no bony tenderness, no swelling, no effusion, no crepitus, no deformity, no laceration, no spasm, normal pulse and normal strength.     Left shoulder: He exhibits decreased range of motion, tenderness and pain. He exhibits no bony tenderness, no swelling, no effusion, no crepitus, no deformity, no laceration, no spasm, normal pulse and normal strength.     Right elbow: Normal.    Left elbow: Normal.     Cervical back: Normal.     Right lower leg: No edema.     Left lower leg: No edema.  Lymphadenopathy:     Cervical: No cervical adenopathy.  Skin:    General: Skin is warm and dry.      Capillary Refill: Capillary refill takes less than 2 seconds.     Coloration: Skin is not cyanotic, jaundiced or pale.     Findings: No rash.  Neurological:     General: No focal deficit present.     Mental Status: He is alert and oriented to person, place, and time.     Cranial Nerves: Cranial nerves are intact. No cranial nerve deficit.     Sensory: Sensation is intact. No sensory deficit.     Motor: Motor function is intact. No weakness.     Coordination: Coordination is intact. Coordination normal.     Gait: Gait is intact. Gait normal.     Deep Tendon Reflexes: Reflexes are normal and symmetric. Reflexes normal.  Psychiatric:        Attention and Perception: Attention and perception normal.        Mood and Affect: Mood and affect normal.        Speech: Speech normal.        Behavior: Behavior normal. Behavior is cooperative.        Thought Content: Thought content normal.        Cognition and Memory: Cognition and memory normal.        Judgment: Judgment normal.     Results for orders placed or performed in visit on 09/01/18  Fecal occult blood, imunochemical   Specimen: Stool   STOOL  Result Value Ref Range   Fecal Occult Bld Positive (A) Negative     Joint Injection/Arthrocentesis  Date/Time: 05/04/2019 2:30 PM Performed by: Baruch Gouty, FNP Authorized by: Baruch Gouty, FNP  Indications: pain  Body area: shoulder Joint: right shoulder Local anesthesia used: yes  Anesthesia: Local anesthesia used: yes Local Anesthetic: lidocaine spray  Sedation: Patient sedated: no  Preparation: Patient was prepped and draped in the  usual sterile fashion. Needle size: 20 G Ultrasound guidance: no Approach: posterior Aspirate amount: 0 mL Methylprednisolone amount: 60 mg Lidocaine 2% amount: 3.5 mL Patient tolerance: patient tolerated the procedure well with no immediate complications Comments: Pts name, DOB, allergies, and procedure verified prior to starting. Verbal  consent from pt obtained. Risks and benefits discussed.       Pertinent labs & imaging results that were available during my care of the patient were reviewed by me and considered in my medical decision making.  Assessment & Plan:  Isabel was seen today for medical management of chronic issues, hyperlipidemia, hypertension and pain mgmt.  Diagnoses and all orders for this visit:  Essential hypertension BP elevated in office today but logs form home are ranging 120-130/60-70 on a regular basis. Pt likely has white coat syndrome.  BP well controlled at home, slightly elevated in office today. Changes were not made in regimen today. Daily blood pressure log given with instructions on how to fill out and told to bring to next visit. Gaol BP 130/80. Pt aware to report any persistent high or low readings. DASH diet and exercise encouraged. Exercise at least 150 minutes per week and increase as tolerated. Goal BMI > 25. Stress management encouraged. Smoking cessation discussed. Avoid excessive alcohol. Avoid NSAID's. Avoid more than 2000 mg of sodium daily. Medications as prescribed. Follow up as scheduled.  -     losartan (COZAAR) 50 MG tablet; Take 1 tablet (50 mg total) by mouth daily. -     CMP14+EGFR -     CBC with Differential/Platelet -     Thyroid Panel With TSH  Pure hypercholesterolemia Diet encouraged - increase intake of fresh fruits and vegetables, increase intake of lean proteins. Bake, broil, or grill foods. Avoid fried, greasy, and fatty foods. Avoid fast foods. Increase intake of fiber-rich whole grains.  Exercise encouraged - at least 150 minutes per week and advance as tolerated.  Goal BMI < 25.  Will initiate therapy if warranted. Follow up in 3-6 months as discussed.  -     Lipid panel  Anxiety with depression Pt has been on Ativan twice daily for anxiety control for several years. Pt states he has never been on a daily controller medication. Will initiate fluoxetine today  and pt to slowly taper off of Ativan. Pt aware of how to wean off properly. Pt to follow up for reevaluation in 2 weeks. Medications as prescribed.  -     FLUoxetine (PROZAC) 20 MG capsule; Take 1 capsule (20 mg total) by mouth daily.  Vitamin D deficiency On oral repletion therapy. Does have chronic arthralgias. Will check level today and change repletion therapy if warranted.  -     VITAMIN D 25 Hydroxy (Vit-D Deficiency, Fractures)  Chronic right shoulder pain Worsening chronic right shoulder pain. Joint injection in office today. Pt tolerated will. Continue medications as prescribed, only as needed. Report any new or worsening symptoms.  -     methylPREDNISolone acetate (DEPO-MEDROL) injection 60 mg -     Joint Injection/Arthrocentesis     Continue all other maintenance medications.  Follow up plan: Return in about 2 weeks (around 05/18/2019), or if symptoms worsen or fail to improve, for anxiety.  Continue healthy lifestyle choices, including diet (rich in fruits, vegetables, and lean proteins, and low in salt and simple carbohydrates) and exercise (at least 30 minutes of moderate physical activity daily).  Educational handout given for joint injection   The above assessment and management  plan was discussed with the patient. The patient verbalized understanding of and has agreed to the management plan. Patient is aware to call the clinic if symptoms persist or worsen. Patient is aware when to return to the clinic for a follow-up visit. Patient educated on when it is appropriate to go to the emergency department.   Monia Pouch, FNP-C [facility] [phone] 05/04/19

## 2019-05-04 NOTE — Patient Instructions (Signed)
Joint Steroid Injection A joint steroid injection is a procedure to relieve swelling and pain in a joint. Steroids are medicines that reduce inflammation. In this procedure, your health care provider uses a syringe and a needle to inject a steroid medicine into a painful and inflamed joint. A pain-relieving medicine (anesthetic) may be injected along with the steroid. In some cases, your health care provider may use an imaging technique such as ultrasound or fluoroscopy to guide the injection. Joints that are often treated with steroid injections include the knee, shoulder, hip, and spine. These injections may also be used in the elbow, ankle, and joints of the hands or feet. You may have joint steroid injections as part of your treatment for inflammation caused by:  Gout.  Rheumatoid arthritis.  Advanced wear-and-tear arthritis (osteoarthritis).  Tendinitis.  Bursitis. Joint steroid injections may be repeated, but having them too often can damage a joint or the skin over the joint. You should not have joint steroid injections less than 6 weeks apart or more than four times a year. Tell a health care provider about:  Any allergies you have.  All medicines you are taking, including vitamins, herbs, eye drops, creams, and over-the-counter medicines.  Any problems you or family members have had with anesthetic medicines.  Any blood disorders you have.  Any surgeries you have had.  Any medical conditions you have.  Whether you are pregnant or may be pregnant. What are the risks? Generally, this is a safe treatment. However, problems may occur, including:  Infection.  Bleeding.  Allergic reactions to medicines.  Damage to the joint or tissues around the joint.  Thinning of skin or loss of skin color over the joint.  Temporary flushing of the face or chest.  Temporary increase in pain.  Temporary increase in blood sugar.  Failure to relieve inflammation or pain. What  happens before the treatment?  You may have imaging tests of your joint.  Ask your health care provider about: ? Changing or stopping your regular medicines. This is especially important if you are taking diabetes medicines or blood thinners. ? Taking medicines such as aspirin and ibuprofen. These medicines can thin your blood. Do not take these medicines unless your health care provider tells you to take them. ? Taking over-the-counter medicines, vitamins, herbs, and supplements.  Ask your health care provider if you can drive yourself home after the procedure. What happens during the treatment?   Your health care provider will position you for the injection and locate the injection site over your joint.  The skin over the joint will be cleaned with a germ-killing soap.  Your health care provider may: ? Spray a numbing solution (topical anesthetic) over the injection site. ? Inject a local anesthetic under the skin above your joint.  The needle will be placed through your skin into your joint. Your health care provider may use imaging to guide the needle to the right spot for the injection. If imaging is used, a special contrast dye may be injected to confirm that the needle is in the correct location.  The steroid medicine will be injected into your joint.  Anesthetic may be injected along with the steroid. This may be a medicine that relieves pain for a short time (short-acting anesthetic) or for a longer time (long-acting anesthetic).  The needle will be removed, and an adhesive bandage (dressing) will be placed over the injection site. The procedure may vary among health care providers and hospitals. What can I   expect after the treatment?  You will be able to go home after the treatment.  It is normal to feel slight flushing for a few days after the injection.  After the treatment, it is common to have an increase in joint pain after the anesthetic has worn off. This may  happen about an hour after a short-acting anesthetic or about 8 hours after a longer-acting anesthetic.  You should begin to feel relief from joint pain and swelling after 24 to 48 hours. Follow these instructions at home: Injection site care  Leave the adhesive dressing over your injection site in place until your health care provider says you can remove it.  Check your injection site every day for signs of infection. Check for: ? Redness, swelling, or pain. ? Fluid or blood. ? Warmth. ? Pus or a bad smell. Activity  Return to your normal activities as told by your health care provider. Ask your health care provider what activities are safe for you. You may be asked to limit activities that put stress on the joint for a few days.  Do joint exercises as told by your health care provider.  Do not take baths, swim, or use a hot tub until your health care provider approves. Managing pain, stiffness, and swelling   If directed, put ice on the joint. ? Put ice in a plastic bag. ? Place a towel between your skin and the bag. ? Leave the ice on for 20 minutes, 2-3 times a day.  Raise (elevate) your joint above the level of your heart when you are sitting or lying down. General instructions  Take over-the-counter and prescription medicines only as told by your health care provider.  Do not use any products that contain nicotine or tobacco, such as cigarettes, e-cigarettes, and chewing tobacco. These can delay joint healing. If you need help quitting, ask your health care provider.  If you have diabetes, be aware that your blood sugar may be slightly elevated for several days after the injection.  Keep all follow-up visits as told by your health care provider. This is important. Contact a health care provider if you have:  Chills or a fever.  Any signs of infection at your injection site.  Increased pain or swelling or no relief after 2 days. Summary  A joint steroid injection  is a treatment to relieve pain and swelling in a joint.  Steroids are medicines that reduce inflammation. Your health care provider may add an anesthetic along with the steroid.  You may have joint steroid injections as part of your arthritis treatment.  Joint steroid injections may be repeated, but having them too often can damage a joint or the skin over the joint.  Contact your health care provider if you have a fever, chills, or signs of infection or if you get no relief from joint pain or swelling. This information is not intended to replace advice given to you by your health care provider. Make sure you discuss any questions you have with your health care provider. Document Released: 05/18/2018 Document Revised: 05/18/2018 Document Reviewed: 05/18/2018 Elsevier Patient Education  2020 [employer]  

## 2019-05-05 ENCOUNTER — Telehealth: Payer: Self-pay | Admitting: Family Medicine

## 2019-05-05 DIAGNOSIS — G8929 Other chronic pain: Secondary | ICD-10-CM | POA: Diagnosis not present

## 2019-05-05 NOTE — Telephone Encounter (Signed)
Patient concerned about the fluoxetine (Prozac) and side effects.  Advised patient that not every patient experiences the side effects listed but if he started to have any of the side effect to contact this office to evaluate

## 2019-05-05 NOTE — Addendum Note (Signed)
Addended by: Zannie Cove on: 05/05/2019 10:51 AM   Modules accepted: Orders

## 2019-05-05 NOTE — Addendum Note (Signed)
Addended by: Earlene Plater on: 05/05/2019 10:51 AM   Modules accepted: Orders

## 2019-05-07 LAB — TOXASSURE SELECT 13 (MW), URINE

## 2019-05-16 ENCOUNTER — Other Ambulatory Visit: Payer: Self-pay | Admitting: Family Medicine

## 2019-05-17 ENCOUNTER — Other Ambulatory Visit: Payer: Self-pay

## 2019-05-18 ENCOUNTER — Ambulatory Visit (INDEPENDENT_AMBULATORY_CARE_PROVIDER_SITE_OTHER): Payer: Medicare Other | Admitting: Family Medicine

## 2019-05-18 ENCOUNTER — Encounter: Payer: Self-pay | Admitting: Family Medicine

## 2019-05-18 ENCOUNTER — Other Ambulatory Visit: Payer: Self-pay | Admitting: Family Medicine

## 2019-05-18 VITALS — BP 144/96 | HR 109 | Temp 99.0°F | Ht 66.0 in | Wt 169.0 lb

## 2019-05-18 DIAGNOSIS — F411 Generalized anxiety disorder: Secondary | ICD-10-CM | POA: Insufficient documentation

## 2019-05-18 DIAGNOSIS — F339 Major depressive disorder, recurrent, unspecified: Secondary | ICD-10-CM

## 2019-05-18 DIAGNOSIS — M150 Primary generalized (osteo)arthritis: Secondary | ICD-10-CM

## 2019-05-18 DIAGNOSIS — M159 Polyosteoarthritis, unspecified: Secondary | ICD-10-CM

## 2019-05-18 MED ORDER — BUSPIRONE HCL 5 MG PO TABS: 5.0000 mg | ORAL_TABLET | Freq: Three times a day (TID) | ORAL | 3 refills | Status: DC

## 2019-05-18 MED ORDER — BUSPIRONE HCL 5 MG PO TABS: 5.0000 mg | ORAL_TABLET | Freq: Three times a day (TID) | ORAL | 2 refills | Status: AC

## 2019-05-18 MED ORDER — HYDROCODONE-ACETAMINOPHEN 5-325 MG PO TABS: 1.0000 | ORAL_TABLET | Freq: Four times a day (QID) | ORAL | 0 refills | Status: DC | PRN

## 2019-05-18 MED ORDER — LORAZEPAM 0.5 MG PO TABS: 0.2500 mg | ORAL_TABLET | Freq: Two times a day (BID) | ORAL | 0 refills | Status: DC

## 2019-05-18 MED ORDER — LORAZEPAM 0.5 MG PO TABS: 0.2500 mg | ORAL_TABLET | Freq: Two times a day (BID) | ORAL | 0 refills | Status: AC

## 2019-05-18 NOTE — Progress Notes (Signed)
Subjective:  Patient ID: Joshua Bowman, male    DOB: 20-Sep-1946, 73 y.o.   MRN: [id number]  Patient Care Team: Baruch Gouty, FNP as PCP - General (Family Medicine) Minus Breeding, MD as Consulting Physician (Cardiology) Susa Day, MD as Consulting Physician (Orthopedic Surgery) Gatha Mayer, MD as Consulting Physician (Gastroenterology) Harlen Labs, MD as Referring Physician (Optometry) Hayden Pedro, MD as Consulting Physician (Ophthalmology)   Chief Complaint:  Depression (2 week follow up )   HPI: Joshua Bowman is a 73 y.o. male presenting on 05/18/2019 for Depression (2 week follow up )   1. Depression, recurrent ([place])   2. GAD (generalized anxiety disorder)  Pt following up today after initiation of fluoxetine for control of depression and anxiety. Pt was to titrate slowly off of lorazepam due to chronic use of opioid pain medications. Pt states he has not stopped taking the lorazepam completely. States he last took some last night to help him sleep. Pt states he has been taking the fluoxetine and has not noticed a difference in his anxiety or depression. Pt states he is taking the medications on a daily basis as prescribed. No adverse drug reactions.  Depression screen [facility] 2/9 05/18/2019 05/04/2019 02/16/2019 08/25/2018 04/22/2018  Decreased Interest 0 0 0 1 1  Down, Depressed, Hopeless 1 1 1 1  0  PHQ - 2 Score 1 1 1 2 1   Altered sleeping - - - 2 -  Tired, decreased energy 0 - - 1 -  Change in appetite 0 - - 0 -  Feeling bad or failure about yourself  0 - - 1 -  Trouble concentrating 1 - - 0 -  Moving slowly or fidgety/restless 0 - - 0 -  Suicidal thoughts 0 - - 0 -  PHQ-9 Score - - - 6 -  Difficult doing work/chores - - - Somewhat difficult -   GAD 7 : Generalized Anxiety Score 05/18/2019 05/04/2019  Nervous, Anxious, on Edge 1 1  Control/stop worrying 1 1  Worry too much - different things 1 1  Trouble relaxing 0 0  Restless 0 0  Easily annoyed or  irritable 0 0  Afraid - awful might happen 0 0  Total GAD 7 Score 3 3      3. Primary osteoarthritis involving multiple joints  Pain assessment: Cause of pain- osteoarthritis of multiple joints Pain location- multiple joints, shoulders, knees, back, hands Pain on scale of 1-10- 7-8/10 Frequency- daily What increases pain-activity, certain movements What makes pain Better- rest, medications  Effects on ADL - minimal Any change in general medical condition-none  Current opioids rx- Norco 5 / 325 mg # meds rx- #120 Effectiveness of current meds-good Adverse reactions form pain meds-none Morphine equivalent- 20 MEDD  Pill count performed-No Last drug screen - 05/05/2019 Urine drug screen today- No Was the [place] reviewed- yes  If yes were their any concerning findings? - none  Opioid Risk  05/18/2019  Alcohol 3  Illegal Drugs 0  Rx Drugs 0  Alcohol 3  Illegal Drugs 0  Rx Drugs 0  Age between 16-45 years  0  History of Preadolescent Sexual Abuse 0  Psychological Disease 0  Depression 1  Opioid Risk Tool Scoring 7  Opioid Risk Interpretation Moderate Risk     Pain contract signed on: 05/05/2019      Relevant past medical, surgical, family, and social history reviewed and updated as indicated.  Allergies and medications reviewed and  updated. Date reviewed: Chart in Epic.   Past Medical History:  Diagnosis Date   Anxiety    Arthritis    Bilateral carotid artery stenosis    Minimal 2012    Colon polyps - attenuated polyposis syndrome    multiple    COPD (chronic obstructive pulmonary disease) (HCC)    Duodenitis    Gastritis    Hemorrhoids    Hyperlipidemia    Hyperplasia of prostate    IBS (irritable bowel syndrome)    Pre-diabetes    Sigmoid diverticulosis    mild     Past Surgical History:  Procedure Laterality Date   APPENDECTOMY  1969   COLONOSCOPY     ESOPHAGOGASTRODUODENOSCOPY     EYE SURGERY     laser - left eye - Dr  Zigmund Daniel   lipoma rt shoulder  01/2001   outpatient    nasal surgery ( spurs )  70's   rt. shoulder -rotator cuff repair  08/2005   Dr. Maxie Better    TONSILLECTOMY      Social History   Socioeconomic History   Marital status: Married    Spouse name: dale    Number of children: 1   Years of education: Not on file   Highest education level: Not on file  Occupational History   Occupation: retired     Comment: [employer]er, [employer]   Social Needs   Emergency planning/management officer strain: Not on file   Food insecurity    Worry: Not on file    Inability: Not on file   Transportation needs    Medical: Not on file    Non-medical: Not on file  Tobacco Use   Smoking status: Current Every Day Smoker    Packs/day: 0.50    Types: Cigarettes    Start date: 09/29/1980   Smokeless tobacco: Former Systems developer  Substance and Sexual Activity   Alcohol use: No   Drug use: No   Sexual activity: Not on file  Lifestyle   Physical activity    Days per week: Not on file    Minutes per session: Not on file   Stress: Not on file  Relationships   Social connections    Talks on phone: Not on file    Gets together: Not on file    Attends religious service: Not on file    Active member of club or organization: Not on file    Attends meetings of clubs or organizations: Not on file    Relationship status: Not on file   Intimate partner violence    Fear of current or ex partner: Not on file    Emotionally abused: Not on file    Physically abused: Not on file    Forced sexual activity: Not on file  Other Topics Concern   Not on file  Social History Narrative   Retired from a nursing home.  Lives with second wife.     Outpatient Encounter Medications as of 05/18/2019  Medication Sig   albuterol (PROAIR HFA) 108 (90 BASE) MCG/ACT inhaler INHALE ONE OR TWO PUFFS BY MOUTH FOUR TIMES DAILY AS NEEDED   aspirin 325 MG tablet Take 325 mg by mouth daily.   Cholecalciferol (VITAMIN D3)  5000 UNITS CAPS Take 1 tablet by mouth. 2-3 x week   diclofenac sodium (VOLTAREN) 1 % GEL Apply 4 g topically 2 (two) times daily.   FLUoxetine (PROZAC) 20 MG capsule Take 1 capsule (20 mg total) by mouth daily.   [START  ON 07/24/2019] HYDROcodone-acetaminophen (NORCO/VICODIN) 5-325 MG tablet Take 1 tablet by mouth every 6 (six) hours as needed for moderate pain.   [START ON 06/24/2019] HYDROcodone-acetaminophen (NORCO/VICODIN) 5-325 MG tablet Take 1 tablet by mouth every 6 (six) hours as needed for moderate pain.   [START ON 05/24/2019] HYDROcodone-acetaminophen (NORCO/VICODIN) 5-325 MG tablet Take 1 tablet by mouth every 6 (six) hours as needed for moderate pain.   losartan (COZAAR) 50 MG tablet Take 1 tablet (50 mg total) by mouth daily.   SYMBICORT 160-4.5 MCG/ACT inhaler INHALE 2 PUFFS BY MOUTH INTO THE LUNGS TWICE A DAY (RINSE MOUTH AFTER EACH USE)   [DISCONTINUED] HYDROcodone-acetaminophen (NORCO/VICODIN) 5-325 MG tablet Take 1 tablet by mouth every 6 (six) hours as needed for moderate pain.   [DISCONTINUED] HYDROcodone-acetaminophen (NORCO/VICODIN) 5-325 MG tablet Take 1 tablet by mouth every 6 (six) hours as needed for moderate pain.   [DISCONTINUED] HYDROcodone-acetaminophen (NORCO/VICODIN) 5-325 MG tablet Take 1 tablet by mouth every 6 (six) hours as needed for moderate pain.   [DISCONTINUED] LORazepam (ATIVAN) 0.5 MG tablet Take 1 tablet (0.5 mg total) by mouth 2 (two) times daily as needed.   busPIRone (BUSPAR) 5 MG tablet Take 1 tablet (5 mg total) by mouth 3 (three) times daily.   LORazepam (ATIVAN) 0.5 MG tablet Take 0.5 tablets (0.25 mg total) by mouth 2 (two) times daily.   [DISCONTINUED] busPIRone (BUSPAR) 5 MG tablet Take 1 tablet (5 mg total) by mouth 3 (three) times daily.   [DISCONTINUED] busPIRone (BUSPAR) 5 MG tablet Take 1 tablet (5 mg total) by mouth 3 (three) times daily.   [DISCONTINUED] busPIRone (BUSPAR) 5 MG tablet Take 1 tablet (5 mg total) by mouth 3  (three) times daily.   [DISCONTINUED] LORazepam (ATIVAN) 0.5 MG tablet Take 0.5 tablets (0.25 mg total) by mouth 2 (two) times daily.   No facility-administered encounter medications on file as of 05/18/2019.     Allergies  Allergen Reactions   Lipitor [Atorvastatin] Other (See Comments)    myalgias   Sulfa Antibiotics Itching and Rash    Review of Systems  Constitutional: Negative for activity change, appetite change, chills, diaphoresis and fatigue.  HENT: Negative.   Eyes: Negative.  Negative for photophobia and visual disturbance.  Respiratory: Negative for cough, chest tightness, shortness of breath and wheezing.   Cardiovascular: Positive for palpitations. Negative for chest pain and leg swelling.  Gastrointestinal: Negative for abdominal pain, blood in stool, constipation, diarrhea, nausea and vomiting.  Endocrine: Negative.  Negative for cold intolerance, heat intolerance, polydipsia, polyphagia and polyuria.  Genitourinary: Negative for decreased urine volume, difficulty urinating, dysuria, frequency and urgency.  Musculoskeletal: Positive for arthralgias, joint swelling and myalgias. Negative for back pain, neck pain and neck stiffness.  Skin: Negative.  Negative for color change, pallor, rash and wound.  Allergic/Immunologic: Negative.   Neurological: Negative for dizziness, tremors, seizures, syncope, facial asymmetry, speech difficulty, weakness, light-headedness, numbness and headaches.  Hematological: Negative.   Psychiatric/Behavioral: Positive for agitation, decreased concentration, dysphoric mood and sleep disturbance. Negative for behavioral problems, confusion, hallucinations, self-injury and suicidal ideas. The patient is nervous/anxious. The patient is not hyperactive.   All other systems reviewed and are negative.       Objective:  BP (!) 144/96 (BP Location: Right Arm, Cuff Size: Normal)    Pulse (!) 109    Temp 99 F (37.2 C)    Ht 5\' 6"  (1.676 m)    Wt  169 lb (76.7 kg)    BMI 27.28 kg/m  Wt Readings from Last 3 Encounters:  05/18/19 169 lb (76.7 kg)  05/04/19 169 lb (76.7 kg)  02/16/19 167 lb (75.8 kg)    Physical Exam Vitals signs and nursing note reviewed.  Constitutional:      General: He is not in acute distress.    Appearance: Normal appearance. He is well-developed, well-groomed and overweight. He is not ill-appearing, toxic-appearing or diaphoretic.  HENT:     Head: Normocephalic and atraumatic.     Jaw: There is normal jaw occlusion.     Right Ear: Hearing normal.     Left Ear: Hearing normal.     Nose: Nose normal.     Mouth/Throat:     Lips: Pink.     Mouth: Mucous membranes are moist.     Pharynx: Oropharynx is clear. Uvula midline.  Eyes:     General: Lids are normal.     Extraocular Movements: Extraocular movements intact.     Conjunctiva/sclera: Conjunctivae normal.     Pupils: Pupils are equal, round, and reactive to light.  Neck:     Musculoskeletal: Normal range of motion and neck supple.     Thyroid: No thyroid mass, thyromegaly or thyroid tenderness.     Vascular: No carotid bruit or JVD.     Trachea: Trachea and phonation normal.  Cardiovascular:     Rate and Rhythm: Normal rate and regular rhythm.     Chest Wall: PMI is not displaced.     Pulses: Normal pulses.     Heart sounds: Normal heart sounds. No murmur. No friction rub. No gallop.   Pulmonary:     Effort: Pulmonary effort is normal. No respiratory distress.     Breath sounds: Normal breath sounds. No wheezing.  Abdominal:     General: Bowel sounds are normal. There is no distension or abdominal bruit.     Palpations: Abdomen is soft. There is no hepatomegaly or splenomegaly.     Tenderness: There is no abdominal tenderness. There is no right CVA tenderness or left CVA tenderness.     Hernia: No hernia is present.  Musculoskeletal:     Right shoulder: He exhibits decreased range of motion (ROM improved from previous visit. ), tenderness  and pain. He exhibits no bony tenderness, no swelling, no effusion, no crepitus, no deformity, no laceration, no spasm, normal pulse and normal strength.     Left shoulder: He exhibits decreased range of motion, tenderness and pain. He exhibits no bony tenderness, no swelling, no effusion, no crepitus, no deformity, no laceration, no spasm, normal pulse and normal strength.     Right hip: Normal.     Left hip: Normal.     Right knee: He exhibits normal range of motion, no swelling, no effusion, no ecchymosis, no deformity, no laceration, no erythema, normal alignment, no LCL laxity, normal patellar mobility, no bony tenderness, normal meniscus and no MCL laxity. Tenderness found.     Left knee: He exhibits normal range of motion, no swelling, no effusion, no ecchymosis, no deformity, no laceration, no erythema, normal alignment, no LCL laxity, normal patellar mobility, no bony tenderness, normal meniscus and no MCL laxity. Tenderness found. No medial joint line, no lateral joint line, no LCL and no patellar tendon tenderness noted.     Right ankle: Normal.     Left ankle: Normal.     Cervical back: Normal.     Right forearm: Normal.     Left forearm: Normal.     Right hand: He  exhibits tenderness. He exhibits normal range of motion, no bony tenderness, normal two-point discrimination, normal capillary refill, no deformity, no laceration and no swelling. Normal sensation noted. Normal strength noted.     Left hand: He exhibits tenderness. He exhibits normal range of motion, no bony tenderness, normal two-point discrimination, normal capillary refill, no deformity, no laceration and no swelling. Normal sensation noted. Normal strength noted.     Right lower leg: No edema.     Left lower leg: No edema.  Lymphadenopathy:     Cervical: No cervical adenopathy.  Skin:    General: Skin is warm and dry.     Capillary Refill: Capillary refill takes less than 2 seconds.     Coloration: Skin is not cyanotic,  jaundiced or pale.     Findings: No rash.  Neurological:     General: No focal deficit present.     Mental Status: He is alert and oriented to person, place, and time.     Cranial Nerves: Cranial nerves are intact.     Sensory: Sensation is intact.     Motor: Motor function is intact.     Coordination: Coordination is intact.     Gait: Gait is intact.     Deep Tendon Reflexes: Reflexes are normal and symmetric.  Psychiatric:        Attention and Perception: Attention and perception normal.        Mood and Affect: Mood and affect normal.        Speech: Speech normal.        Behavior: Behavior normal. Behavior is cooperative.        Thought Content: Thought content normal.        Cognition and Memory: Cognition and memory normal.        Judgment: Judgment normal.     Results for orders placed or performed in visit on 05/04/19  ToxASSURE Select 13 (MW), Urine  Result Value Ref Range   Summary Note        Pertinent labs & imaging results that were available during my care of the patient were reviewed by me and considered in my medical decision making.  Assessment & Plan:  Tajon was seen today for depression.  Diagnoses and all orders for this visit:  Depression, recurrent ([place]) GAD (generalized anxiety disorder) Long discussion about tapering off of lorazepam and starting Buspar for anxiety. Pt will continue fluoxetine. Pt aware to cut lorazepam in half and only take as night as needed for sleep and anxiety. Pt aware to slowly start cutting back on the number of nights he takes the lorazepam. Pt aware of acute withdrawal symptoms and when to seek emergent evaluation and treatment. Will slowly wean off and start Buspar. Pt verbalized understanding and reasoning behind the cessation of the lorazepam. Pt aware to follow up in 1 month or sooner if needed.  -     LORazepam (ATIVAN) 0.5 MG tablet; Take 0.5 tablets (0.25 mg total) by mouth 2 (two) times daily. -     busPIRone (BUSPAR) 5  MG tablet; Take 1 tablet (5 mg total) by mouth 3 (three) times daily.  Primary osteoarthritis involving multiple joints Chronic pain with long term use of narcotic pain medications. Will continue below. Pt aware to avoid OTC tylenol and any alcohol.  -     HYDROcodone-acetaminophen (NORCO/VICODIN) 5-325 MG tablet; Take 1 tablet by mouth every 6 (six) hours as needed for moderate pain. -     HYDROcodone-acetaminophen (NORCO/VICODIN) 5-325 MG  tablet; Take 1 tablet by mouth every 6 (six) hours as needed for moderate pain. -     HYDROcodone-acetaminophen (NORCO/VICODIN) 5-325 MG tablet; Take 1 tablet by mouth every 6 (six) hours as needed for moderate pain.     Continue all other maintenance medications.  Follow up plan: Return in about 3 months (around 08/18/2019), or if symptoms worsen or fail to improve, for Pain, GAD.  Continue healthy lifestyle choices, including diet (rich in fruits, vegetables, and lean proteins, and low in salt and simple carbohydrates) and exercise (at least 30 minutes of moderate physical activity daily).  Educational handout given for depression   The above assessment and management plan was discussed with the patient. The patient verbalized understanding of and has agreed to the management plan. Patient is aware to call the clinic if symptoms persist or worsen. Patient is aware when to return to the clinic for a follow-up visit. Patient educated on when it is appropriate to go to the emergency department.   Monia Pouch, FNP-C [facility] [phone] 05/18/19

## 2019-05-18 NOTE — Patient Instructions (Signed)
If your symptoms worsen or you have thoughts of suicide/homicide, PLEASE SEEK IMMEDIATE MEDICAL ATTENTION.  You may always call the National Suicide Hotline.  This is available 24 hours a day, 7 days a week.  Their number is: 1-800-273-8255  Taking the medicine as directed and not missing any doses is one of the best things you can do to treat your depression.  Here are some things to keep in mind:  1) Side effects (stomach upset, some increased anxiety) may happen before you notice a benefit.  These side effects typically go away over time. 2) Changes to your dose of medicine or a change in medication all together is sometimes necessary 3) Most people need to be on medication at least 12 months 4) Many people will notice an improvement within two weeks but the full effect of the medication can take up to 4-6 weeks 5) Stopping the medication when you start feeling better often results in a return of symptoms 6) Never discontinue your medication without contacting a health care professional first.  Some medications require gradual discontinuation/ taper and can make you sick if you stop them abruptly.  If your symptoms worsen or you have thoughts of suicide/homicide, PLEASE SEEK IMMEDIATE MEDICAL ATTENTION.  You may always call:  National Suicide Hotline: 800-273-8255 [place] Crisis Line: 336-832-9700 Crisis Recovery in [place]: 800-939-5911   These are available 24 hours a day, 7 days a week.   

## 2019-07-19 ENCOUNTER — Other Ambulatory Visit: Payer: Self-pay

## 2019-07-20 ENCOUNTER — Encounter: Payer: Self-pay | Admitting: Family Medicine

## 2019-07-20 ENCOUNTER — Ambulatory Visit (INDEPENDENT_AMBULATORY_CARE_PROVIDER_SITE_OTHER): Payer: Medicare Other | Admitting: Family Medicine

## 2019-07-20 ENCOUNTER — Ambulatory Visit (INDEPENDENT_AMBULATORY_CARE_PROVIDER_SITE_OTHER): Payer: Medicare Other

## 2019-07-20 VITALS — BP 147/89 | HR 95 | Temp 99.0°F | Resp 20 | Ht 66.0 in | Wt 168.0 lb

## 2019-07-20 DIAGNOSIS — M25511 Pain in right shoulder: Secondary | ICD-10-CM

## 2019-07-20 DIAGNOSIS — M159 Polyosteoarthritis, unspecified: Secondary | ICD-10-CM

## 2019-07-20 DIAGNOSIS — F339 Major depressive disorder, recurrent, unspecified: Secondary | ICD-10-CM

## 2019-07-20 DIAGNOSIS — Z6827 Body mass index (BMI) 27.0-27.9, adult: Secondary | ICD-10-CM | POA: Insufficient documentation

## 2019-07-20 DIAGNOSIS — I10 Essential (primary) hypertension: Secondary | ICD-10-CM

## 2019-07-20 DIAGNOSIS — Z23 Encounter for immunization: Secondary | ICD-10-CM

## 2019-07-20 DIAGNOSIS — F411 Generalized anxiety disorder: Secondary | ICD-10-CM

## 2019-07-20 DIAGNOSIS — N401 Enlarged prostate with lower urinary tract symptoms: Secondary | ICD-10-CM

## 2019-07-20 DIAGNOSIS — G8929 Other chronic pain: Secondary | ICD-10-CM

## 2019-07-20 DIAGNOSIS — M8949 Other hypertrophic osteoarthropathy, multiple sites: Secondary | ICD-10-CM

## 2019-07-20 DIAGNOSIS — J418 Mixed simple and mucopurulent chronic bronchitis: Secondary | ICD-10-CM

## 2019-07-20 DIAGNOSIS — E559 Vitamin D deficiency, unspecified: Secondary | ICD-10-CM | POA: Diagnosis not present

## 2019-07-20 DIAGNOSIS — N3943 Post-void dribbling: Secondary | ICD-10-CM

## 2019-07-20 MED ORDER — BUSPIRONE HCL 5 MG PO TABS: 5.0000 mg | ORAL_TABLET | Freq: Three times a day (TID) | ORAL | 6 refills | Status: AC

## 2019-07-20 MED ORDER — HYDROCODONE-ACETAMINOPHEN 5-325 MG PO TABS: 1.0000 | ORAL_TABLET | Freq: Four times a day (QID) | ORAL | 0 refills | Status: DC | PRN

## 2019-07-20 MED ORDER — BUDESONIDE-FORMOTEROL FUMARATE 160-4.5 MCG/ACT IN AERO: INHALATION_SPRAY | RESPIRATORY_TRACT | 6 refills | Status: DC

## 2019-07-20 NOTE — Patient Instructions (Addendum)
If your symptoms worsen or you have thoughts of suicide/homicide, PLEASE SEEK IMMEDIATE MEDICAL ATTENTION.  You may always call the National Suicide Hotline.  This is available 24 hours a day, 7 days a week.  Their number is: 1-800-273-8255  Taking the medicine as directed and not missing any doses is one of the best things you can do to treat your depression.  Here are some things to keep in mind:  1) Side effects (stomach upset, some increased anxiety) may happen before you notice a benefit.  These side effects typically go away over time. 2) Changes to your dose of medicine or a change in medication all together is sometimes necessary 3) Most people need to be on medication at least 12 months 4) Many people will notice an improvement within two weeks but the full effect of the medication can take up to 4-6 weeks 5) Stopping the medication when you start feeling better often results in a return of symptoms 6) Never discontinue your medication without contacting a health care professional first.  Some medications require gradual discontinuation/ taper and can make you sick if you stop them abruptly.  If your symptoms worsen or you have thoughts of suicide/homicide, PLEASE SEEK IMMEDIATE MEDICAL ATTENTION.  You may always call:  National Suicide Hotline: 800-273-8255 [place] Crisis Line: 336-832-9700 Crisis Recovery in [place]: 800-939-5911   These are available 24 hours a day, 7 days a week.   

## 2019-07-20 NOTE — Progress Notes (Signed)
Subjective:  Patient ID: Joshua Bowman, male    DOB: 14-Dec-1945, 73 y.o.   MRN: [id number]  Patient Care Team: Baruch Gouty, FNP as PCP - General (Family Medicine) Minus Breeding, MD as Consulting Physician (Cardiology) Susa Day, MD as Consulting Physician (Orthopedic Surgery) Gatha Mayer, MD as Consulting Physician (Gastroenterology) Harlen Labs, MD as Referring Physician (Optometry) Hayden Pedro, MD as Consulting Physician (Ophthalmology)   Chief Complaint:  Medical Management of Chronic Issues (3 mo - pain and GAD )  1. Primary osteoarthritis involving multiple joints 2. Chronic pain in right shoulder He does report worsening right shoulder pain. States he has noticed limited ROM due to the pain. No known injury. Pain worse with activity and heavy lifting.  Pain assessment: Cause of pain- arthritis Pain location- shoulders, back, knees Pain on scale of 1-10- 6/10 Frequency- daily What increases pain-activity / exertion What makes pain Better-rest and medications Effects on ADL - minimal Any change in general medical condition-no  Current opioids rx- Hydrocodone / Acetaminophen 5 / 325 mg # meds rx- #120 Effectiveness of current meds-great relief of pain Adverse reactions form pain meds-none Morphine equivalent- 20 MEDD  Pill count performed-No Last drug screen - 05/05/2019 Urine drug screen today- No Was the [facility] reviewed- yes  If yes were their any concerning findings? - no  Opioid Risk  05/18/2019  Alcohol 3  Illegal Drugs 0  Rx Drugs 0  Alcohol 3  Illegal Drugs 0  Rx Drugs 0  Age between 16-45 years  0  History of Preadolescent Sexual Abuse 0  Psychological Disease 0  Depression 1  Opioid Risk Tool Scoring 7  Opioid Risk Interpretation Moderate Risk   Pain contract signed on: 05/05/2019  3. Depression, recurrent ([place]) 4. GAD (generalized anxiety disorder) Doing very well on Buspar. Pt stopped fluoxetine due to side effects.  States he had a decreased libido and trouble sleeping. States his anxiety is controlled with the Buspar. States he takes this 1 to 3 times daily depending on anxiety level. He states he is feeling less anxious and better since last visit.   GAD 7 : Generalized Anxiety Score 07/20/2019 05/18/2019 05/04/2019  Nervous, Anxious, on Edge '1 1 1  ' Control/stop worrying '1 1 1  ' Worry too much - different things 0 1 1  Trouble relaxing 1 0 0  Restless 1 0 0  Easily annoyed or irritable 1 0 0  Afraid - awful might happen 0 0 0  Total GAD 7 Score '5 3 3  ' Anxiety Difficulty Not difficult at all - -   Depression screen [facility] 2/9 07/20/2019 05/18/2019 05/04/2019 02/16/2019 08/25/2018  Decreased Interest 1 0 0 0 1  Down, Depressed, Hopeless '1 1 1 1 1  ' PHQ - 2 Score '2 1 1 1 2  ' Altered sleeping 1 - - - 2  Tired, decreased energy 1 0 - - 1  Change in appetite 0 0 - - 0  Feeling bad or failure about yourself  0 0 - - 1  Trouble concentrating 0 1 - - 0  Moving slowly or fidgety/restless 0 0 - - 0  Suicidal thoughts 0 0 - - 0  PHQ-9 Score 4 - - - 6  Difficult doing work/chores Not difficult at all - - - Somewhat difficult    5. Essential hypertension Complaint with meds - Yes Current Medications - losartan Checking BP at home ranging 125/70, home log provided today Exercising Regularly - Yes  Watching Salt intake - Yes Pertinent ROS:  Headache - No Fatigue - No Visual Disturbances - No Chest pain - No Dyspnea - No Palpitations - No LE edema - No They report good compliance with medications and can restate their regimen by memory. No medication side effects.  Family, social, and smoking history reviewed.   BP Readings from Last 3 Encounters:  07/20/19 (!) 147/89  05/18/19 (!) 144/96  05/04/19 138/74   CMP Latest Ref Rng & Units 07/20/2019 08/25/2018 04/22/2018  Glucose 65 - 99 mg/dL 91 95 114(H)  BUN 8 - 27 mg/dL '19 11 11  ' Creatinine 0.76 - 1.27 mg/dL 0.92 1.08 1.05  Sodium 134 - 144 mmol/L 139  138 137  Potassium 3.5 - 5.2 mmol/L 4.9 4.3 4.5  Chloride 96 - 106 mmol/L 101 95(L) 96  CO2 20 - 29 mmol/L '26 22 22  ' Calcium 8.6 - 10.2 mg/dL 9.8 10.2 9.9  Total Protein 6.0 - 8.5 g/dL 6.5 6.8 6.4  Total Bilirubin 0.0 - 1.2 mg/dL 0.3 0.4 0.4  Alkaline Phos 39 - 117 IU/L 58 54 53  AST 0 - 40 IU/L '18 22 17  ' ALT 0 - 44 IU/L '11 18 11    ' 6. Vitamin D deficiency Pt is taking oral repletion therapy. Denies bone pain and tenderness, muscle weakness, fracture, and difficulty walking. Lab Results  Component Value Date   VD25OH 36.0 07/20/2019   VD25OH 33.8 08/25/2018   VD25OH 34.9 04/22/2018   Lab Results  Component Value Date   CALCIUM 9.8 07/20/2019     7. Benign prostatic hyperplasia with post-void dribbling Pt reports minimal symptoms. States he does have intermittent post-void dribbling.   8. BMI 27.0-27.9,adult Does try to watch diet and is very active daily.   9. Mixed simple and mucopurulent chronic bronchitis ([place]) Well controlled with Symbicort. Does continue to smoke. States he has cut back to 1/2 PPD most days.  Relevant past medical, surgical, family, and social history reviewed and updated as indicated.  Allergies and medications reviewed and updated. Date reviewed: Chart in Epic.   Past Medical History:  Diagnosis Date  . Anxiety   . Arthritis   . Bilateral carotid artery stenosis    Minimal 2012   . Colon polyps - attenuated polyposis syndrome    multiple   . COPD (chronic obstructive pulmonary disease) ([place])   . Duodenitis   . Gastritis   . Hemorrhoids   . Hyperlipidemia   . Hyperplasia of prostate   . IBS (irritable bowel syndrome)   . Pre-diabetes   . Sigmoid diverticulosis    mild     Past Surgical History:  Procedure Laterality Date  . APPENDECTOMY  1969  . COLONOSCOPY    . ESOPHAGOGASTRODUODENOSCOPY    . EYE SURGERY     laser - left eye - Dr Zigmund Daniel  . lipoma rt shoulder  01/2001   outpatient   . nasal surgery ( spurs )  70's  . rt.  shoulder -rotator cuff repair  08/2005   Dr. Maxie Better   . TONSILLECTOMY      Social History   Socioeconomic History  . Marital status: Married    Spouse name: dale   . Number of children: 1  . Years of education: Not on file  . Highest education level: Not on file  Occupational History  . 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 Financial resource strain: Not on file  .  Food insecurity    Worry: Not on file    Inability: Not on file  . Transportation needs    Medical: Not on file    Non-medical: Not on file  Tobacco Use  . Smoking status: Current Every Day Smoker    Packs/day: 0.50    Types: Cigarettes    Start date: 09/29/1980  . Smokeless tobacco: Former Network engineer and Sexual Activity  . Alcohol use: No  . Drug use: No  . Sexual activity: Not on file  Lifestyle  . Physical activity    Days per week: Not on file    Minutes per session: Not on file  . Stress: Not on file  Relationships  . Social Herbalist on phone: Not on file    Gets together: Not on file    Attends religious service: Not on file    Active member of club or organization: Not on file    Attends meetings of clubs or organizations: Not on file    Relationship status: Not on file  . Intimate partner violence    Fear of current or ex partner: Not on file    Emotionally abused: Not on file    Physically abused: Not on file    Forced sexual activity: Not on file  Other Topics Concern  . Not on file  Social History Narrative   Retired from a nursing home.  Lives with second wife.     Outpatient Encounter Medications as of 07/20/2019  Medication Sig  . albuterol (PROAIR HFA) 108 (90 BASE) MCG/ACT inhaler INHALE ONE OR TWO PUFFS BY MOUTH FOUR TIMES DAILY AS NEEDED  . aspirin 325 MG tablet Take 325 mg by mouth daily.  . budesonide-formoterol (SYMBICORT) 160-4.5 MCG/ACT inhaler INHALE 2 PUFFS BY MOUTH INTO THE LUNGS TWICE A DAY (RINSE MOUTH  AFTER EACH USE)  . busPIRone (BUSPAR) 5 MG tablet Take 1 tablet (5 mg total) by mouth 3 (three) times daily.  . Cholecalciferol (VITAMIN D3) 5000 UNITS CAPS Take 1 tablet by mouth. 2-3 x week  . diclofenac sodium (VOLTAREN) 1 % GEL Apply 4 g topically 2 (two) times daily.  Derrill Memo ON 07/24/2019] HYDROcodone-acetaminophen (NORCO/VICODIN) 5-325 MG tablet Take 1 tablet by mouth every 6 (six) hours as needed for moderate pain.  Marland Kitchen losartan (COZAAR) 50 MG tablet Take 1 tablet (50 mg total) by mouth daily.  . [DISCONTINUED] busPIRone (BUSPAR) 5 MG tablet Take 5 mg by mouth 3 (three) times daily.  . [DISCONTINUED] HYDROcodone-acetaminophen (NORCO/VICODIN) 5-325 MG tablet Take 1 tablet by mouth every 6 (six) hours as needed for moderate pain.  . [DISCONTINUED] SYMBICORT 160-4.5 MCG/ACT inhaler INHALE 2 PUFFS BY MOUTH INTO THE LUNGS TWICE A DAY (RINSE MOUTH AFTER EACH USE)  . HYDROcodone-acetaminophen (NORCO/VICODIN) 5-325 MG tablet Take 1 tablet by mouth every 6 (six) hours as needed for moderate pain.  Marland Kitchen HYDROcodone-acetaminophen (NORCO/VICODIN) 5-325 MG tablet Take 1 tablet by mouth every 6 (six) hours as needed for moderate pain.  . [DISCONTINUED] FLUoxetine (PROZAC) 20 MG capsule Take 1 capsule (20 mg total) by mouth daily. (Patient not taking: Reported on 07/20/2019)  . [DISCONTINUED] HYDROcodone-acetaminophen (NORCO/VICODIN) 5-325 MG tablet Take 1 tablet by mouth every 6 (six) hours as needed for moderate pain. (Patient not taking: Reported on 07/20/2019)  . [DISCONTINUED] HYDROcodone-acetaminophen (NORCO/VICODIN) 5-325 MG tablet Take 1 tablet by mouth every 6 (six) hours as needed for moderate pain. (Patient not taking: Reported on 07/20/2019)   No facility-administered encounter  medications on file as of 07/20/2019.     Allergies  Allergen Reactions  . Lipitor [Atorvastatin] Other (See Comments)    myalgias  . Sulfa Antibiotics Itching and Rash    Review of Systems  Constitutional: Negative  for activity change, appetite change, chills, diaphoresis, fatigue, fever and unexpected weight change.  HENT: Negative.   Eyes: Negative.  Negative for photophobia and visual disturbance.  Respiratory: Positive for cough. Negative for chest tightness and shortness of breath.   Cardiovascular: Negative for chest pain, palpitations and leg swelling.  Gastrointestinal: Negative for abdominal distention, abdominal pain, anal bleeding, blood in stool, constipation, diarrhea, nausea, rectal pain and vomiting.  Endocrine: Negative.  Negative for cold intolerance, heat intolerance, polydipsia, polyphagia and polyuria.  Genitourinary: Negative for decreased urine volume, difficulty urinating, dysuria, flank pain, frequency, hematuria, penile pain, penile swelling, scrotal swelling, testicular pain and urgency.  Musculoskeletal: Positive for arthralgias, back pain and myalgias. Negative for neck pain and neck stiffness.  Skin: Negative.  Negative for color change and pallor.  Allergic/Immunologic: Negative.   Neurological: Negative for dizziness, tremors, seizures, syncope, facial asymmetry, speech difficulty, weakness, light-headedness, numbness and headaches.  Hematological: Negative.  Does not bruise/bleed easily.  Psychiatric/Behavioral: Negative for confusion, hallucinations, sleep disturbance and suicidal ideas.  All other systems reviewed and are negative.       Objective:  BP (!) 147/89   Pulse 95   Temp 99 F (37.2 C)   Resp 20   Ht '5\' 6"'  (1.676 m)   Wt 168 lb (76.2 kg)   SpO2 97%   BMI 27.12 kg/m    Wt Readings from Last 3 Encounters:  07/20/19 168 lb (76.2 kg)  05/18/19 169 lb (76.7 kg)  05/04/19 169 lb (76.7 kg)    Physical Exam Vitals signs and nursing note reviewed.  Constitutional:      General: He is not in acute distress.    Appearance: Normal appearance. He is well-developed, well-groomed and overweight. He is not ill-appearing, toxic-appearing or diaphoretic.   HENT:     Head: Normocephalic and atraumatic.     Jaw: There is normal jaw occlusion.     Right Ear: Hearing normal.     Left Ear: Hearing normal.     Nose: Nose normal.     Mouth/Throat:     Lips: Pink.     Mouth: Mucous membranes are moist.     Pharynx: Oropharynx is clear. Uvula midline.  Eyes:     General: Lids are normal.     Extraocular Movements: Extraocular movements intact.     Conjunctiva/sclera: Conjunctivae normal.     Pupils: Pupils are equal, round, and reactive to light.  Neck:     Musculoskeletal: Normal range of motion and neck supple.     Thyroid: No thyroid mass, thyromegaly or thyroid tenderness.     Vascular: No carotid bruit or JVD.     Trachea: Trachea and phonation normal.  Cardiovascular:     Rate and Rhythm: Normal rate and regular rhythm.     Chest Wall: PMI is not displaced.     Pulses: Normal pulses.     Heart sounds: Normal heart sounds. No murmur. No friction rub. No gallop.   Pulmonary:     Effort: Pulmonary effort is normal. No respiratory distress.     Breath sounds: Normal breath sounds. No wheezing.  Abdominal:     General: Bowel sounds are normal. There is no distension or abdominal bruit.     Palpations: Abdomen  is soft. There is no hepatomegaly or splenomegaly.     Tenderness: There is no abdominal tenderness. There is no right CVA tenderness or left CVA tenderness.     Hernia: No hernia is present.  Musculoskeletal:     Right shoulder: He exhibits decreased range of motion, tenderness and pain. He exhibits no bony tenderness, no swelling, no effusion, no crepitus, no deformity, no laceration, no spasm, normal pulse and normal strength.     Left shoulder: He exhibits decreased range of motion and tenderness. He exhibits no bony tenderness, no swelling, no effusion, no crepitus, no deformity, no laceration, no pain, no spasm, normal pulse and normal strength.     Right elbow: Normal.    Left elbow: Normal.     Right hip: Normal.     Left  hip: Normal.     Right knee: He exhibits decreased range of motion. He exhibits no swelling, no effusion, no ecchymosis, no deformity, no laceration, no erythema, normal alignment, no LCL laxity, normal patellar mobility, no bony tenderness, normal meniscus and no MCL laxity. Tenderness found.     Left knee: He exhibits decreased range of motion. He exhibits no swelling, no effusion, no ecchymosis, no deformity, no laceration, no erythema, normal alignment, no LCL laxity, normal patellar mobility, no bony tenderness, normal meniscus and no MCL laxity. Tenderness found.     Right ankle: Normal.     Left ankle: Normal.     Lumbar back: He exhibits decreased range of motion and pain. He exhibits no tenderness, no bony tenderness, no swelling, no edema, no deformity, no laceration, no spasm and normal pulse.     Right upper leg: Normal.     Left upper leg: Normal.     Right lower leg: No edema.     Left lower leg: No edema.  Lymphadenopathy:     Cervical: No cervical adenopathy.  Skin:    General: Skin is warm and dry.     Capillary Refill: Capillary refill takes less than 2 seconds.     Coloration: Skin is not cyanotic, jaundiced or pale.     Findings: No rash.  Neurological:     General: No focal deficit present.     Mental Status: He is alert and oriented to person, place, and time.     Cranial Nerves: Cranial nerves are intact.     Sensory: Sensation is intact.     Motor: Motor function is intact.     Coordination: Coordination is intact.     Gait: Gait abnormal (antalgic gait).     Deep Tendon Reflexes: Reflexes are normal and symmetric.  Psychiatric:        Attention and Perception: Attention and perception normal.        Mood and Affect: Mood and affect normal.        Speech: Speech normal.        Behavior: Behavior normal. Behavior is cooperative.        Thought Content: Thought content normal.        Cognition and Memory: Cognition and memory normal.        Judgment: Judgment  normal.     Results for orders placed or performed in visit on 07/20/19  CMP14+EGFR  Result Value Ref Range   Glucose 91 65 - 99 mg/dL   BUN 19 8 - 27 mg/dL   Creatinine, Ser 0.92 0.76 - 1.27 mg/dL   GFR calc non Af Amer 82 >59 mL/min/1.73   GFR calc  Af Amer 95 >59 mL/min/1.73   BUN/Creatinine Ratio 21 10 - 24   Sodium 139 134 - 144 mmol/L   Potassium 4.9 3.5 - 5.2 mmol/L   Chloride 101 96 - 106 mmol/L   CO2 26 20 - 29 mmol/L   Calcium 9.8 8.6 - 10.2 mg/dL   Total Protein 6.5 6.0 - 8.5 g/dL   Albumin 4.4 3.7 - 4.7 g/dL   Globulin, Total 2.1 1.5 - 4.5 g/dL   Albumin/Globulin Ratio 2.1 1.2 - 2.2   Bilirubin Total 0.3 0.0 - 1.2 mg/dL   Alkaline Phosphatase 58 39 - 117 IU/L   AST 18 0 - 40 IU/L   ALT 11 0 - 44 IU/L  CBC with Differential/Platelet  Result Value Ref Range   WBC 9.6 3.4 - 10.8 x10E3/uL   RBC 5.58 4.14 - 5.80 x10E6/uL   Hemoglobin 17.7 13.0 - 17.7 g/dL   Hematocrit 51.6 (H) 37.5 - 51.0 %   MCV 93 79 - 97 fL   MCH 31.7 26.6 - 33.0 pg   MCHC 34.3 31.5 - 35.7 g/dL   RDW 12.7 11.6 - 15.4 %   Platelets 272 150 - 450 x10E3/uL   Neutrophils 63 Not Estab. %   Lymphs 25 Not Estab. %   Monocytes 10 Not Estab. %   Eos 1 Not Estab. %   Basos 1 Not Estab. %   Neutrophils Absolute 6.1 1.4 - 7.0 x10E3/uL   Lymphocytes Absolute 2.4 0.7 - 3.1 x10E3/uL   Monocytes Absolute 0.9 0.1 - 0.9 x10E3/uL   EOS (ABSOLUTE) 0.1 0.0 - 0.4 x10E3/uL   Basophils Absolute 0.1 0.0 - 0.2 x10E3/uL   Immature Granulocytes 0 Not Estab. %   Immature Grans (Abs) 0.0 0.0 - 0.1 x10E3/uL  Lipid panel  Result Value Ref Range   Cholesterol, Total 187 100 - 199 mg/dL   Triglycerides 93 0 - 149 mg/dL   HDL 51 >39 mg/dL   VLDL Cholesterol Cal 17 5 - 40 mg/dL   LDL Chol Calc (NIH) 119 (H) 0 - 99 mg/dL   Chol/HDL Ratio 3.7 0.0 - 5.0 ratio  Thyroid Panel With TSH  Result Value Ref Range   TSH 1.340 0.450 - 4.500 uIU/mL   T4, Total 7.6 4.5 - 12.0 ug/dL   T3 Uptake Ratio 27 24 - 39 %   Free Thyroxine  Index 2.1 1.2 - 4.9  VITAMIN D 25 Hydroxy (Vit-D Deficiency, Fractures)  Result Value Ref Range   Vit D, 25-Hydroxy 36.0 30.0 - 100.0 ng/mL       Pertinent labs & imaging results that were available during my care of the patient were reviewed by me and considered in my medical decision making.  Assessment & Plan:  [place] was seen today for medical management of chronic issues.  Diagnoses and all orders for this visit:  Primary osteoarthritis involving multiple joints Chronic pain in right shoulder Worsening right shoulder pain. Imaging concerning for complete rotator cuff tear. Will refer to ortho. Continue pain medications as prescribed. Follow up with ortho as discussed.  -     DG Shoulder Right; Future -     HYDROcodone-acetaminophen (NORCO/VICODIN) 5-325 MG tablet; Take 1 tablet by mouth every 6 (six) hours as needed for moderate pain. -     HYDROcodone-acetaminophen (NORCO/VICODIN) 5-325 MG tablet; Take 1 tablet by mouth every 6 (six) hours as needed for moderate pain. -     HYDROcodone-acetaminophen (NORCO/VICODIN) 5-325 MG tablet; Take 1 tablet by mouth every 6 (six)  hours as needed for moderate pain.  Depression, recurrent ([place]) GAD (generalized anxiety disorder) Doing well, continue below. Report any new or worsening symptoms.  -     Thyroid Panel With TSH -     busPIRone (BUSPAR) 5 MG tablet; Take 1 tablet (5 mg total) by mouth 3 (three) times daily.  Essential hypertension BP fairly controlled in office. Pt has home logs with great readings. Elevated BP in office may be due to white coat syndrome. Changes were not made in regimen today. Goal BP is 130/80. Pt aware to report any persistent high or low readings. DASH diet and exercise encouraged. Exercise at least 150 minutes per week and increase as tolerated. Goal BMI > 25. Stress management encouraged. Avoid nicotine and tobacco product use. Avoid excessive alcohol and NSAID's. Avoid more than 2000 mg of sodium daily.  Medications as prescribed. Follow up as scheduled.  -     CMP14+EGFR -     CBC with Differential/Platelet -     Lipid panel  Vitamin D deficiency Labs pending. Continue repletion therapy. If indicated, will change repletion dosage. Eat foods rich in Vit D including milk, orange juice, yogurt with vitamin D added, salmon or mackerel, canned tuna fish, cereals with vitamin D added, and cod liver oil. Get out in the sun but make sure to wear at least SPF 30 sunscreen.  -     VITAMIN D 25 Hydroxy (Vit-D Deficiency, Fractures)  Benign prostatic hyperplasia with post-void dribbling Minimal symptoms. Will check PSA today.  -     PR PSA, TOTAL SCREENING  BMI 27.0-27.9,adult Diet and exercise encouraged. Labs pending.  -     CMP14+EGFR -     CBC with Differential/Platelet -     Lipid panel -     Thyroid Panel With TSH  Mixed simple and mucopurulent chronic bronchitis ([facility]) Well controlled, continue below.  -     budesonide-formoterol (SYMBICORT) 160-4.5 MCG/ACT inhaler; INHALE 2 PUFFS BY MOUTH INTO THE LUNGS TWICE A DAY (RINSE MOUTH AFTER EACH USE)  Need for immunization against influenza -     Flu Vaccine QUAD High Dose(Fluad)     Continue all other maintenance medications.  Follow up plan: Return in about 3 months (around 10/20/2019), or if symptoms worsen or fail to improve, for GAD, Pain.  Continue healthy lifestyle choices, including diet (rich in fruits, vegetables, and lean proteins, and low in salt and simple carbohydrates) and exercise (at least 30 minutes of moderate physical activity daily).  Educational handout given for depression   The above assessment and management plan was discussed with the patient. The patient verbalized understanding of and has agreed to the management plan. Patient is aware to call the clinic if they develop any new symptoms or if symptoms persist or worsen. Patient is aware when to return to the clinic for a follow-up visit. Patient educated on when  it is appropriate to go to the emergency department.   Monia Pouch, FNP-C [facility] [phone]

## 2019-07-21 LAB — THYROID PANEL WITH TSH
Free Thyroxine Index: 2.1 (ref 1.2–4.9)
T3 Uptake Ratio: 27 % (ref 24–39)
T4, Total: 7.6 ug/dL (ref 4.5–12.0)
TSH: 1.34 u[IU]/mL (ref 0.450–4.500)

## 2019-07-21 LAB — VITAMIN D 25 HYDROXY (VIT D DEFICIENCY, FRACTURES): Vit D, 25-Hydroxy: 36 ng/mL (ref 30.0–100.0)

## 2019-07-21 LAB — CBC WITH DIFFERENTIAL/PLATELET
Basophils Absolute: 0.1 10*3/uL (ref 0.0–0.2)
Basos: 1 %
EOS (ABSOLUTE): 0.1 10*3/uL (ref 0.0–0.4)
Eos: 1 %
Hematocrit: 51.6 % — ABNORMAL HIGH (ref 37.5–51.0)
Hemoglobin: 17.7 g/dL (ref 13.0–17.7)
Immature Grans (Abs): 0 10*3/uL (ref 0.0–0.1)
Immature Granulocytes: 0 %
Lymphocytes Absolute: 2.4 10*3/uL (ref 0.7–3.1)
Lymphs: 25 %
MCH: 31.7 pg (ref 26.6–33.0)
MCHC: 34.3 g/dL (ref 31.5–35.7)
MCV: 93 fL (ref 79–97)
Monocytes Absolute: 0.9 10*3/uL (ref 0.1–0.9)
Monocytes: 10 %
Neutrophils Absolute: 6.1 10*3/uL (ref 1.4–7.0)
Neutrophils: 63 %
Platelets: 272 10*3/uL (ref 150–450)
RBC: 5.58 x10E6/uL (ref 4.14–5.80)
RDW: 12.7 % (ref 11.6–15.4)
WBC: 9.6 10*3/uL (ref 3.4–10.8)

## 2019-07-21 LAB — CMP14+EGFR
ALT: 11 IU/L (ref 0–44)
AST: 18 IU/L (ref 0–40)
Albumin/Globulin Ratio: 2.1 (ref 1.2–2.2)
Albumin: 4.4 g/dL (ref 3.7–4.7)
Alkaline Phosphatase: 58 IU/L (ref 39–117)
BUN/Creatinine Ratio: 21 (ref 10–24)
BUN: 19 mg/dL (ref 8–27)
Bilirubin Total: 0.3 mg/dL (ref 0.0–1.2)
CO2: 26 mmol/L (ref 20–29)
Calcium: 9.8 mg/dL (ref 8.6–10.2)
Chloride: 101 mmol/L (ref 96–106)
Creatinine, Ser: 0.92 mg/dL (ref 0.76–1.27)
GFR calc Af Amer: 95 mL/min/{1.73_m2} (ref >59)
GFR calc non Af Amer: 82 mL/min/{1.73_m2} (ref >59)
Globulin, Total: 2.1 g/dL (ref 1.5–4.5)
Glucose: 91 mg/dL (ref 65–99)
Potassium: 4.9 mmol/L (ref 3.5–5.2)
Sodium: 139 mmol/L (ref 134–144)
Total Protein: 6.5 g/dL (ref 6.0–8.5)

## 2019-07-21 LAB — LIPID PANEL
Chol/HDL Ratio: 3.7 ratio (ref 0.0–5.0)
Cholesterol, Total: 187 mg/dL (ref 100–199)
HDL: 51 mg/dL (ref >39)
LDL Chol Calc (NIH): 119 mg/dL — ABNORMAL HIGH (ref 0–99)
Triglycerides: 93 mg/dL (ref 0–149)
VLDL Cholesterol Cal: 17 mg/dL (ref 5–40)

## 2019-10-18 ENCOUNTER — Other Ambulatory Visit: Payer: Self-pay

## 2019-10-19 ENCOUNTER — Ambulatory Visit (INDEPENDENT_AMBULATORY_CARE_PROVIDER_SITE_OTHER): Payer: Medicare Other | Admitting: Family Medicine

## 2019-10-19 ENCOUNTER — Encounter: Payer: Self-pay | Admitting: Family Medicine

## 2019-10-19 VITALS — BP 146/89 | HR 116 | Temp 99.6°F | Resp 20 | Ht 66.0 in | Wt 171.0 lb

## 2019-10-19 DIAGNOSIS — F339 Major depressive disorder, recurrent, unspecified: Secondary | ICD-10-CM | POA: Diagnosis not present

## 2019-10-19 DIAGNOSIS — G8929 Other chronic pain: Secondary | ICD-10-CM | POA: Diagnosis not present

## 2019-10-19 DIAGNOSIS — M8949 Other hypertrophic osteoarthropathy, multiple sites: Secondary | ICD-10-CM | POA: Diagnosis not present

## 2019-10-19 DIAGNOSIS — F411 Generalized anxiety disorder: Secondary | ICD-10-CM

## 2019-10-19 DIAGNOSIS — M159 Polyosteoarthritis, unspecified: Secondary | ICD-10-CM

## 2019-10-19 DIAGNOSIS — M25511 Pain in right shoulder: Secondary | ICD-10-CM | POA: Diagnosis not present

## 2019-10-19 DIAGNOSIS — N3943 Post-void dribbling: Secondary | ICD-10-CM

## 2019-10-19 DIAGNOSIS — N401 Enlarged prostate with lower urinary tract symptoms: Secondary | ICD-10-CM

## 2019-10-19 MED ORDER — HYDROCODONE-ACETAMINOPHEN 5-325 MG PO TABS: 1.0000 | ORAL_TABLET | Freq: Four times a day (QID) | ORAL | 0 refills | Status: AC | PRN

## 2019-10-19 MED ORDER — BUSPIRONE HCL 7.5 MG PO TABS: 7.5000 mg | ORAL_TABLET | Freq: Three times a day (TID) | ORAL | 3 refills | Status: DC

## 2019-10-19 MED ORDER — FINASTERIDE 5 MG PO TABS: 5.0000 mg | ORAL_TABLET | Freq: Every day | ORAL | 3 refills | Status: DC

## 2019-10-19 MED ORDER — HYDROCODONE-ACETAMINOPHEN 5-325 MG PO TABS: 1.0000 | ORAL_TABLET | Freq: Four times a day (QID) | ORAL | 0 refills | Status: DC | PRN

## 2019-10-19 MED ORDER — METHYLPREDNISOLONE ACETATE 80 MG/ML IJ SUSP
60.0000 mg | Freq: Once | INTRAMUSCULAR | Status: AC
  Administered 2019-10-19: 11:00:00 60 mg via INTRA_ARTICULAR

## 2019-10-19 NOTE — Patient Instructions (Signed)

## 2019-10-19 NOTE — Progress Notes (Signed)
Subjective:  Patient ID: Joshua Bowman, male    DOB: 10-22-1945, 74 y.o.   MRN: [id number]  Patient Care Team: Baruch Gouty, FNP as PCP - General (Family Medicine) Minus Breeding, MD as Consulting Physician (Cardiology) Susa Day, MD as Consulting Physician (Orthopedic Surgery) Gatha Mayer, MD as Consulting Physician (Gastroenterology) Harlen Labs, MD as Referring Physician (Optometry) Hayden Pedro, MD as Consulting Physician (Ophthalmology)   Chief Complaint:  Medical Management of Chronic Issues, Hypertension, and Medication Refill (3 mo CSA )   HPI: [name] is a 74 y.o. male presenting on 10/19/2019 for Medical Management of Chronic Issues, Hypertension, and Medication Refill (3 mo CSA )  1. GAD (generalized anxiety disorder) Pt reports increasing anxiety and restlessness due to being in the house all of the time due to the cold weather. States the [place] was helping at first but does not feel this is beneficial anymore.  GAD 7 : Generalized Anxiety Score 10/19/2019 07/20/2019 05/18/2019 05/04/2019  Nervous, Anxious, on Edge '1 1 1 1  ' Control/stop worrying '1 1 1 1  ' Worry too much - different things 1 0 1 1  Trouble relaxing 2 1 0 0  Restless 1 1 0 0  Easily annoyed or irritable 1 1 0 0  Afraid - awful might happen 1 0 0 0  Total GAD 7 Score '8 5 3 3  ' Anxiety Difficulty Somewhat difficult Not difficult at all - -      2. Depression, recurrent ([place]) Pt feels his depressive symptoms are not as bad as his anxiety symptoms. States symptoms have increased because he is not able to get out of the house and do anything.   Depression screen [facility] 2/9 10/19/2019 07/20/2019 05/18/2019 05/04/2019 02/16/2019  Decreased Interest 1 1 0 0 0  Down, Depressed, Hopeless '1 1 1 1 1  ' PHQ - 2 Score '2 2 1 1 1  ' Altered sleeping 2 1 - - -  Tired, decreased energy 1 1 0 - -  Change in appetite 0 0 0 - -  Feeling bad or failure about yourself  0 0 0 - -  Trouble concentrating 0  0 1 - -  Moving slowly or fidgety/restless 1 0 0 - -  Suicidal thoughts 0 0 0 - -  PHQ-9 Score 6 4 - - -  Difficult doing work/chores Somewhat difficult Not difficult at all - - -  Some recent data might be hidden     3. Chronic pain in right shoulder 4. Primary osteoarthritis involving multiple joints Has ongoing multiple joint pain. States his right shoulder is still bothering him. He has not followed up with orthopedics. States he has not received an appointment. States the joint injection really helped for about 3 months.  Pain assessment: Cause of pain- arthritis of multiple joints Pain location- shoulders, back, knees Pain on scale of 1-10- 6/10 most days Frequency- daily What increases pain-activity, exertion, certain movements What makes pain Better-medications and rest Effects on ADL - minimal Any change in general medical condition-no  Current opioids rx- Hydrocodone / Acetaminophen 5/325 mg  # meds rx-  #90 Effectiveness of current meds- good relief of pain, only taking 1-3 pills per day as needed, sometimes more depending on how active he is Adverse reactions form pain meds-none Morphine equivalent- 20 MEDD  Pill count performed-No Last drug screen - 05/05/2019 Urine drug screen today- No Was the [place] reviewed- y  If yes were their any  concerning findings? - no  Opioid Risk  05/18/2019  Alcohol 3  Illegal Drugs 0  Rx Drugs 0  Alcohol 3  Illegal Drugs 0  Rx Drugs 0  Age between 16-45 years  0  History of Preadolescent Sexual Abuse 0  Psychological Disease 0  Depression 1  Opioid Risk Tool Scoring 7  Opioid Risk Interpretation Moderate Risk     Pain contract signed on: 05/05/2019   5. Benign prostatic hyperplasia with post-void dribbling Pt states he has noticed an increase in nocturia and post void dribbling. States he has to get up several times per night to go to the bathroom. No dysuria, hematuria, abdominal pain, rectal pain, scrotal pain or  swelling, or testicular pain or swelling.      Relevant past medical, surgical, family, and social history reviewed and updated as indicated.  Allergies and medications reviewed and updated. Date reviewed: Chart in Epic.   Past Medical History:  Diagnosis Date  . Anxiety   . Arthritis   . Bilateral carotid artery stenosis    Minimal 2012   . Colon polyps - attenuated polyposis syndrome    multiple   . COPD (chronic obstructive pulmonary disease) ([place])   . Duodenitis   . Gastritis   . Hemorrhoids   . Hyperlipidemia   . Hyperplasia of prostate   . IBS (irritable bowel syndrome)   . Pre-diabetes   . Sigmoid diverticulosis    mild     Past Surgical History:  Procedure Laterality Date  . APPENDECTOMY  1969  . COLONOSCOPY    . ESOPHAGOGASTRODUODENOSCOPY    . EYE SURGERY     laser - left eye - Dr Zigmund Daniel  . lipoma rt shoulder  01/2001   outpatient   . nasal surgery ( spurs )  70's  . rt. shoulder -rotator cuff repair  08/2005   Dr. Maxie Better   . TONSILLECTOMY      Social History   Socioeconomic History  . Marital status: Married    Spouse name: dale   . Number of children: 1  . Years of education: Not on file  . Highest education level: Not on file  Occupational History  . 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 Smoking status: Current Every Day Smoker    Packs/day: 0.50    Types: Cigarettes    Start date: 09/29/1980  . Smokeless tobacco: Former Network engineer and Sexual Activity  . Alcohol use: No  . Drug use: No  . Sexual activity: Not on file  Other Topics Concern  . Not on file  Social History Narrative   Retired from a nursing home.  Lives with second wife.    Social Determinants of Health   Financial Resource Strain:   . Difficulty of Paying Living Expenses: Not on file  Food Insecurity:   . Worried About Charity fundraiser in the Last Year: Not on file  . Ran Out of Food in the Last Year: Not on file    Transportation Needs:   . Lack of Transportation (Medical): Not on file  . Lack of Transportation (Non-Medical): Not on file  Physical Activity:   . Days of Exercise per Week: Not on file  . Minutes of Exercise per Session: Not on file  Stress:   . Feeling of Stress : Not on file  Social Connections:   . Frequency of Communication with Friends and Family: Not on file  .  Frequency of Social Gatherings with Friends and Family: Not on file  . Attends Religious Services: Not on file  . Active Member of Clubs or Organizations: Not on file  . Attends Archivist Meetings: Not on file  . Marital Status: Not on file  Intimate Partner Violence:   . Fear of Current or Ex-Partner: Not on file  . Emotionally Abused: Not on file  . Physically Abused: Not on file  . Sexually Abused: Not on file    Outpatient Encounter Medications as of 10/19/2019  Medication Sig  . aspirin 325 MG tablet Take 325 mg by mouth daily.  . budesonide-formoterol (SYMBICORT) 160-4.5 MCG/ACT inhaler INHALE 2 PUFFS BY MOUTH INTO THE LUNGS TWICE A DAY (RINSE MOUTH AFTER EACH USE)  . Cholecalciferol (VITAMIN D3) 5000 UNITS CAPS Take 1 tablet by mouth. 2-3 x week  . diclofenac sodium (VOLTAREN) 1 % GEL Apply 4 g topically 2 (two) times daily.  Marland Kitchen HYDROcodone-acetaminophen (NORCO/VICODIN) 5-325 MG tablet Take 1 tablet by mouth every 6 (six) hours as needed for moderate pain.  Marland Kitchen losartan (COZAAR) 50 MG tablet Take 1 tablet (50 mg total) by mouth daily.  . [DISCONTINUED] busPIRone (BUSPAR) 5 MG tablet Take 5 mg by mouth 3 (three) times daily.  . [DISCONTINUED] HYDROcodone-acetaminophen (NORCO/VICODIN) 5-325 MG tablet Take 1 tablet by mouth every 6 (six) hours as needed for moderate pain.  . busPIRone (BUSPAR) 7.5 MG tablet Take 1 tablet (7.5 mg total) by mouth 3 (three) times daily.  . finasteride (PROSCAR) 5 MG tablet Take 1 tablet (5 mg total) by mouth at bedtime.  Marland Kitchen HYDROcodone-acetaminophen (NORCO/VICODIN) 5-325 MG  tablet Take 1 tablet by mouth every 6 (six) hours as needed for moderate pain.  Marland Kitchen HYDROcodone-acetaminophen (NORCO/VICODIN) 5-325 MG tablet Take 1 tablet by mouth every 6 (six) hours as needed for moderate pain.  . [DISCONTINUED] albuterol (PROAIR HFA) 108 (90 BASE) MCG/ACT inhaler INHALE ONE OR TWO PUFFS BY MOUTH FOUR TIMES DAILY AS NEEDED  . [DISCONTINUED] HYDROcodone-acetaminophen (NORCO/VICODIN) 5-325 MG tablet Take 1 tablet by mouth every 6 (six) hours as needed for moderate pain. (Patient not taking: Reported on 10/19/2019)  . [DISCONTINUED] HYDROcodone-acetaminophen (NORCO/VICODIN) 5-325 MG tablet Take 1 tablet by mouth every 6 (six) hours as needed for moderate pain. (Patient not taking: Reported on 10/19/2019)  . [EXPIRED] methylPREDNISolone acetate (DEPO-MEDROL) injection 60 mg    No facility-administered encounter medications on file as of 10/19/2019.    Allergies  Allergen Reactions  . Lipitor [Atorvastatin] Other (See Comments)    myalgias  . Sulfa Antibiotics Itching and Rash    Review of Systems  Constitutional: Negative for activity change, appetite change, chills, diaphoresis, fatigue, fever and unexpected weight change.  HENT: Negative.   Eyes: Negative.  Negative for photophobia and visual disturbance.  Respiratory: Positive for cough (chronic, COPD). Negative for chest tightness and shortness of breath.   Cardiovascular: Negative for chest pain, palpitations and leg swelling.  Gastrointestinal: Negative for abdominal pain, blood in stool, constipation, diarrhea, nausea and vomiting.  Endocrine: Negative.  Negative for polydipsia, polyphagia and polyuria.  Genitourinary: Positive for frequency. Negative for decreased urine volume, difficulty urinating, discharge, dysuria, flank pain, genital sores, hematuria, penile pain, penile swelling, scrotal swelling, testicular pain and urgency.  Musculoskeletal: Positive for arthralgias, joint swelling and myalgias.  Skin: Negative.    Allergic/Immunologic: Negative.   Neurological: Negative for dizziness, tremors, seizures, syncope, facial asymmetry, speech difficulty, weakness, light-headedness, numbness and headaches.  Hematological: Negative.   Psychiatric/Behavioral: Positive for  dysphoric mood and sleep disturbance. Negative for agitation, behavioral problems, confusion, decreased concentration, hallucinations, self-injury and suicidal ideas. The patient is nervous/anxious. The patient is not hyperactive.   All other systems reviewed and are negative.       Objective:  BP (!) 146/89   Pulse (!) 116   Temp 99.6 F (37.6 C)   Resp 20   Ht '5\' 6"'  (1.676 m)   Wt 171 lb (77.6 kg)   SpO2 97%   BMI 27.60 kg/m    Wt Readings from Last 3 Encounters:  10/19/19 171 lb (77.6 kg)  07/20/19 168 lb (76.2 kg)  05/18/19 169 lb (76.7 kg)    Physical Exam Vitals and nursing note reviewed.  Constitutional:      General: He is not in acute distress.    Appearance: Normal appearance. He is well-developed, well-groomed and overweight. He is not ill-appearing, toxic-appearing or diaphoretic.  HENT:     Head: Normocephalic and atraumatic.     Jaw: There is normal jaw occlusion.     Right Ear: Hearing normal.     Left Ear: Hearing normal.     Nose: Nose normal.     Mouth/Throat:     Lips: Pink.     Mouth: Mucous membranes are moist.     Pharynx: Oropharynx is clear. Uvula midline.  Eyes:     General: Lids are normal.     Extraocular Movements: Extraocular movements intact.     Conjunctiva/sclera: Conjunctivae normal.     Pupils: Pupils are equal, round, and reactive to light.  Neck:     Thyroid: No thyroid mass, thyromegaly or thyroid tenderness.     Vascular: No carotid bruit or JVD.     Trachea: Trachea and phonation normal.  Cardiovascular:     Rate and Rhythm: Normal rate and regular rhythm.     Chest Wall: PMI is not displaced.     Pulses: Normal pulses.     Heart sounds: Normal heart sounds. No murmur. No  friction rub. No gallop.   Pulmonary:     Effort: Pulmonary effort is normal. No respiratory distress.     Breath sounds: Normal breath sounds. No wheezing.  Abdominal:     General: Bowel sounds are normal. There is no distension or abdominal bruit.     Palpations: Abdomen is soft. There is no hepatomegaly or splenomegaly.     Tenderness: There is no abdominal tenderness. There is no right CVA tenderness or left CVA tenderness.     Hernia: No hernia is present.  Musculoskeletal:     Right shoulder: Tenderness present. No swelling, deformity, effusion, laceration, bony tenderness or crepitus. Decreased range of motion. Decreased strength. Normal pulse.     Right upper arm: Normal.     Right elbow: Normal.     Right wrist: Normal.     Left wrist: Normal.     Right hand: Swelling and tenderness present. No deformity, lacerations or bony tenderness. Decreased range of motion. Normal strength. Normal sensation. There is no disruption of two-point discrimination. Normal capillary refill. Normal pulse.     Left hand: Swelling and tenderness present. No deformity, lacerations or bony tenderness. Decreased range of motion. Normal strength. Normal sensation. There is no disruption of two-point discrimination. Normal capillary refill. Normal pulse.     Cervical back: Normal, normal range of motion and neck supple.     Right upper leg: Normal.     Left upper leg: Normal.     Right knee:  No swelling, erythema, bony tenderness or crepitus. Decreased range of motion. Tenderness present. No LCL laxity, MCL laxity, ACL laxity or PCL laxity. Normal alignment, normal meniscus and normal patellar mobility. Normal pulse.     Left knee: No swelling, erythema, bony tenderness or crepitus. Decreased range of motion. Tenderness present. No LCL laxity, MCL laxity, ACL laxity or PCL laxity.Normal alignment, normal meniscus and normal patellar mobility. Normal pulse.     Right lower leg: Normal. No edema.     Left lower  leg: Normal. No edema.  Lymphadenopathy:     Cervical: No cervical adenopathy.  Skin:    General: Skin is warm and dry.     Capillary Refill: Capillary refill takes less than 2 seconds.     Coloration: Skin is not cyanotic, jaundiced or pale.     Findings: No rash.  Neurological:     General: No focal deficit present.     Mental Status: He is alert and oriented to person, place, and time.     Cranial Nerves: Cranial nerves are intact. No cranial nerve deficit.     Sensory: Sensation is intact. No sensory deficit.     Motor: Motor function is intact. No weakness.     Coordination: Coordination is intact. Coordination normal.     Gait: Gait abnormal (antalgic).     Deep Tendon Reflexes: Reflexes are normal and symmetric. Reflexes normal.  Psychiatric:        Attention and Perception: Attention and perception normal.        Mood and Affect: Mood and affect normal. Mood is not anxious or depressed.        Speech: Speech normal.        Behavior: Behavior normal. Behavior is cooperative.        Thought Content: Thought content normal.        Cognition and Memory: Cognition and memory normal.        Judgment: Judgment normal.     Results for orders placed or performed in visit on 07/20/19  CMP14+EGFR  Result Value Ref Range   Glucose 91 65 - 99 mg/dL   BUN 19 8 - 27 mg/dL   Creatinine, Ser 0.92 0.76 - 1.27 mg/dL   GFR calc non Af Amer 82 >59 mL/min/1.73   GFR calc Af Amer 95 >59 mL/min/1.73   BUN/Creatinine Ratio 21 10 - 24   Sodium 139 134 - 144 mmol/L   Potassium 4.9 3.5 - 5.2 mmol/L   Chloride 101 96 - 106 mmol/L   CO2 26 20 - 29 mmol/L   Calcium 9.8 8.6 - 10.2 mg/dL   Total Protein 6.5 6.0 - 8.5 g/dL   Albumin 4.4 3.7 - 4.7 g/dL   Globulin, Total 2.1 1.5 - 4.5 g/dL   Albumin/Globulin Ratio 2.1 1.2 - 2.2   Bilirubin Total 0.3 0.0 - 1.2 mg/dL   Alkaline Phosphatase 58 39 - 117 IU/L   AST 18 0 - 40 IU/L   ALT 11 0 - 44 IU/L  CBC with Differential/Platelet  Result Value  Ref Range   WBC 9.6 3.4 - 10.8 x10E3/uL   RBC 5.58 4.14 - 5.80 x10E6/uL   Hemoglobin 17.7 13.0 - 17.7 g/dL   Hematocrit 51.6 (H) 37.5 - 51.0 %   MCV 93 79 - 97 fL   MCH 31.7 26.6 - 33.0 pg   MCHC 34.3 31.5 - 35.7 g/dL   RDW 12.7 11.6 - 15.4 %   Platelets 272 150 - 450 x10E3/uL  Neutrophils 63 Not Estab. %   Lymphs 25 Not Estab. %   Monocytes 10 Not Estab. %   Eos 1 Not Estab. %   Basos 1 Not Estab. %   Neutrophils Absolute 6.1 1.4 - 7.0 x10E3/uL   Lymphocytes Absolute 2.4 0.7 - 3.1 x10E3/uL   Monocytes Absolute 0.9 0.1 - 0.9 x10E3/uL   EOS (ABSOLUTE) 0.1 0.0 - 0.4 x10E3/uL   Basophils Absolute 0.1 0.0 - 0.2 x10E3/uL   Immature Granulocytes 0 Not Estab. %   Immature Grans (Abs) 0.0 0.0 - 0.1 x10E3/uL  Lipid panel  Result Value Ref Range   Cholesterol, Total 187 100 - 199 mg/dL   Triglycerides 93 0 - 149 mg/dL   HDL 51 >39 mg/dL   VLDL Cholesterol Cal 17 5 - 40 mg/dL   LDL Chol Calc (NIH) 119 (H) 0 - 99 mg/dL   Chol/HDL Ratio 3.7 0.0 - 5.0 ratio  Thyroid Panel With TSH  Result Value Ref Range   TSH 1.340 0.450 - 4.500 uIU/mL   T4, Total 7.6 4.5 - 12.0 ug/dL   T3 Uptake Ratio 27 24 - 39 %   Free Thyroxine Index 2.1 1.2 - 4.9  VITAMIN D 25 Hydroxy (Vit-D Deficiency, Fractures)  Result Value Ref Range   Vit D, 25-Hydroxy 36.0 30.0 - 100.0 ng/mL     Joint Injection/Arthrocentesis  Date/Time: 10/19/2019 11:22 AM Performed by: Baruch Gouty, FNP Authorized by: Baruch Gouty, FNP  Indications: pain  Body area: shoulder Joint: right shoulder Local anesthesia used: yes  Anesthesia: Local anesthesia used: yes Local Anesthetic: lidocaine spray  Sedation: Patient sedated: no  Preparation: Patient was prepped and draped in the usual sterile fashion. Needle size: 22 G Ultrasound guidance: no Approach: posterior Aspirate amount: 0 mL Methylprednisolone amount: 60 mg Lidocaine 2% amount: 4 mL Patient tolerance: patient tolerated the procedure well with no immediate  complications Comments: Pts name DOB, allergies, and procedure verified prior to event. Verbal consent obtained.      Pertinent labs & imaging results that were available during my care of the patient were reviewed by me and considered in my medical decision making.  Assessment & Plan:  Montay was seen today for medical management of chronic issues, hypertension and medication refill.  Diagnoses and all orders for this visit:  GAD (generalized anxiety disorder)  Depression, recurrent ([place]) Pt notes an increase in symptoms over the last few weeks from being in the house. States the Buspar was helping at first but is no longer effective. Will increase to 7.5 mg per day. Pt declines SSRI or SNRI therapy, states he can not tolerate the side effects. Pt to report any new, worsening, or persistent symptoms. Follow up as discussed.  -     busPIRone (BUSPAR) 7.5 MG tablet; Take 1 tablet (7.5 mg total) by mouth 3 (three) times daily.  Chronic pain in right shoulder Ongoing and worsening pain in right shoulder. Has not had follow up with ortho as discussed. Will place new referral today. Pt would like joint injection today as this was very beneficial in the past. Last injection more than 3 months ago. Shoulder injection today. Continue pain medications as prescribed as needed.  -     HYDROcodone-acetaminophen (NORCO/VICODIN) 5-325 MG tablet; Take 1 tablet by mouth every 6 (six) hours as needed for moderate pain. -     HYDROcodone-acetaminophen (NORCO/VICODIN) 5-325 MG tablet; Take 1 tablet by mouth every 6 (six) hours as needed for moderate pain. -  HYDROcodone-acetaminophen (NORCO/VICODIN) 5-325 MG tablet; Take 1 tablet by mouth every 6 (six) hours as needed for moderate pain. -     Ambulatory referral to Orthopedic Surgery -     Joint Injection/Arthrocentesis -     methylPREDNISolone acetate (DEPO-MEDROL) injection 60 mg  Primary osteoarthritis involving multiple joints Has been taking 1-3  pills per day as needed for pain. Will decrease amount to 90 pills per month. Pt aware to only take as needed for significant pain. Sedation precautions given. Follow up in 3 months.  -     HYDROcodone-acetaminophen (NORCO/VICODIN) 5-325 MG tablet; Take 1 tablet by mouth every 6 (six) hours as needed for moderate pain. -     HYDROcodone-acetaminophen (NORCO/VICODIN) 5-325 MG tablet; Take 1 tablet by mouth every 6 (six) hours as needed for moderate pain. -     HYDROcodone-acetaminophen (NORCO/VICODIN) 5-325 MG tablet; Take 1 tablet by mouth every 6 (six) hours as needed for moderate pain.  Benign prostatic hyperplasia with post-void dribbling Worsening symptoms. Unable to take Flomax due to sulfa allergy. Will trial Proscar and check PSA today. Pt declined referral to urology at this time. States if PSA is elevated he will consider going.  -     finasteride (PROSCAR) 5 MG tablet; Take 1 tablet (5 mg total) by mouth at bedtime. -     PSA, total and free     Continue all other maintenance medications.  Follow up plan: Return in about 3 months (around 01/17/2020), or if symptoms worsen or fail to improve.  Continue healthy lifestyle choices, including diet (rich in fruits, vegetables, and lean proteins, and low in salt and simple carbohydrates) and exercise (at least 30 minutes of moderate physical activity daily).  Educational handout given for GAD  The above assessment and management plan was discussed with the patient. The patient verbalized understanding of and has agreed to the management plan. Patient is aware to call the clinic if they develop any new symptoms or if symptoms persist or worsen. Patient is aware when to return to the clinic for a follow-up visit. Patient educated on when it is appropriate to go to the emergency department.   Monia Pouch, FNP-C [facility] [phone]

## 2019-10-20 ENCOUNTER — Ambulatory Visit: Payer: Medicare Other | Admitting: Family Medicine

## 2019-10-20 ENCOUNTER — Other Ambulatory Visit: Payer: Self-pay | Admitting: *Deleted

## 2019-10-20 DIAGNOSIS — R972 Elevated prostate specific antigen [PSA]: Secondary | ICD-10-CM

## 2019-10-20 LAB — PSA, TOTAL AND FREE
PSA, Free Pct: 15.4 %
PSA, Free: 0.37 ng/mL
Prostate Specific Ag, Serum: 2.4 ng/mL (ref 0.0–4.0)

## 2019-10-28 IMAGING — DX DG SHOULDER 2+V*R*
3 series · 3 of 3 positions shown · non-contrast
Comparison: 09/14/2015 right shoulder radiographs

CLINICAL DATA: Right shoulder pain and decreased range of motion.
No reported injury. Prior right shoulder surgery.

EXAM:
RIGHT SHOULDER - 2+ VIEW

[shoulder ap]
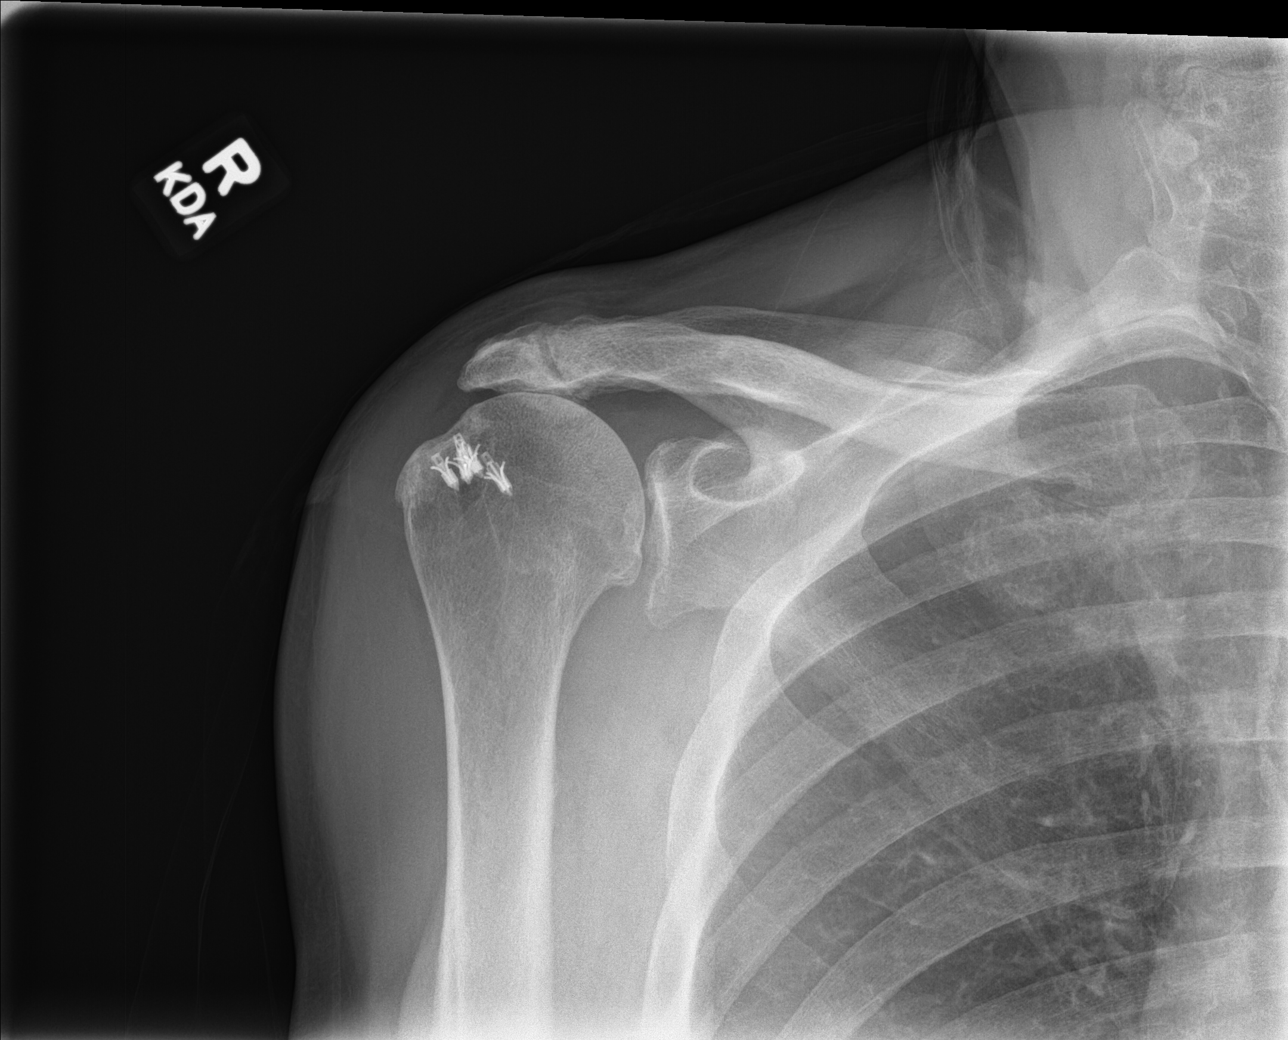

[shoulder obl]
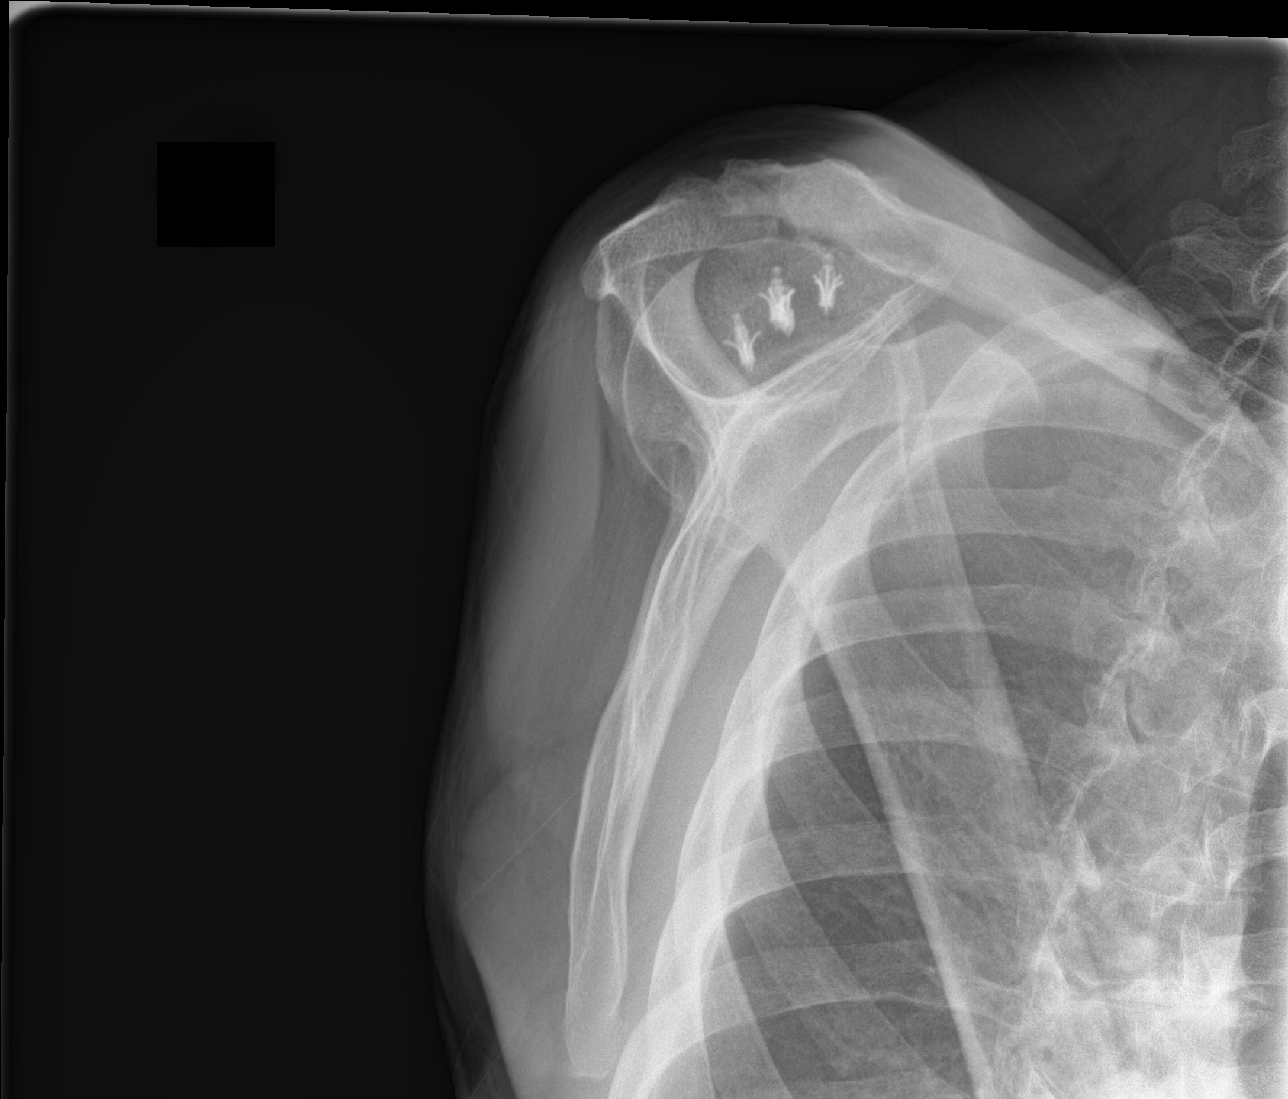

[shoulder axial]
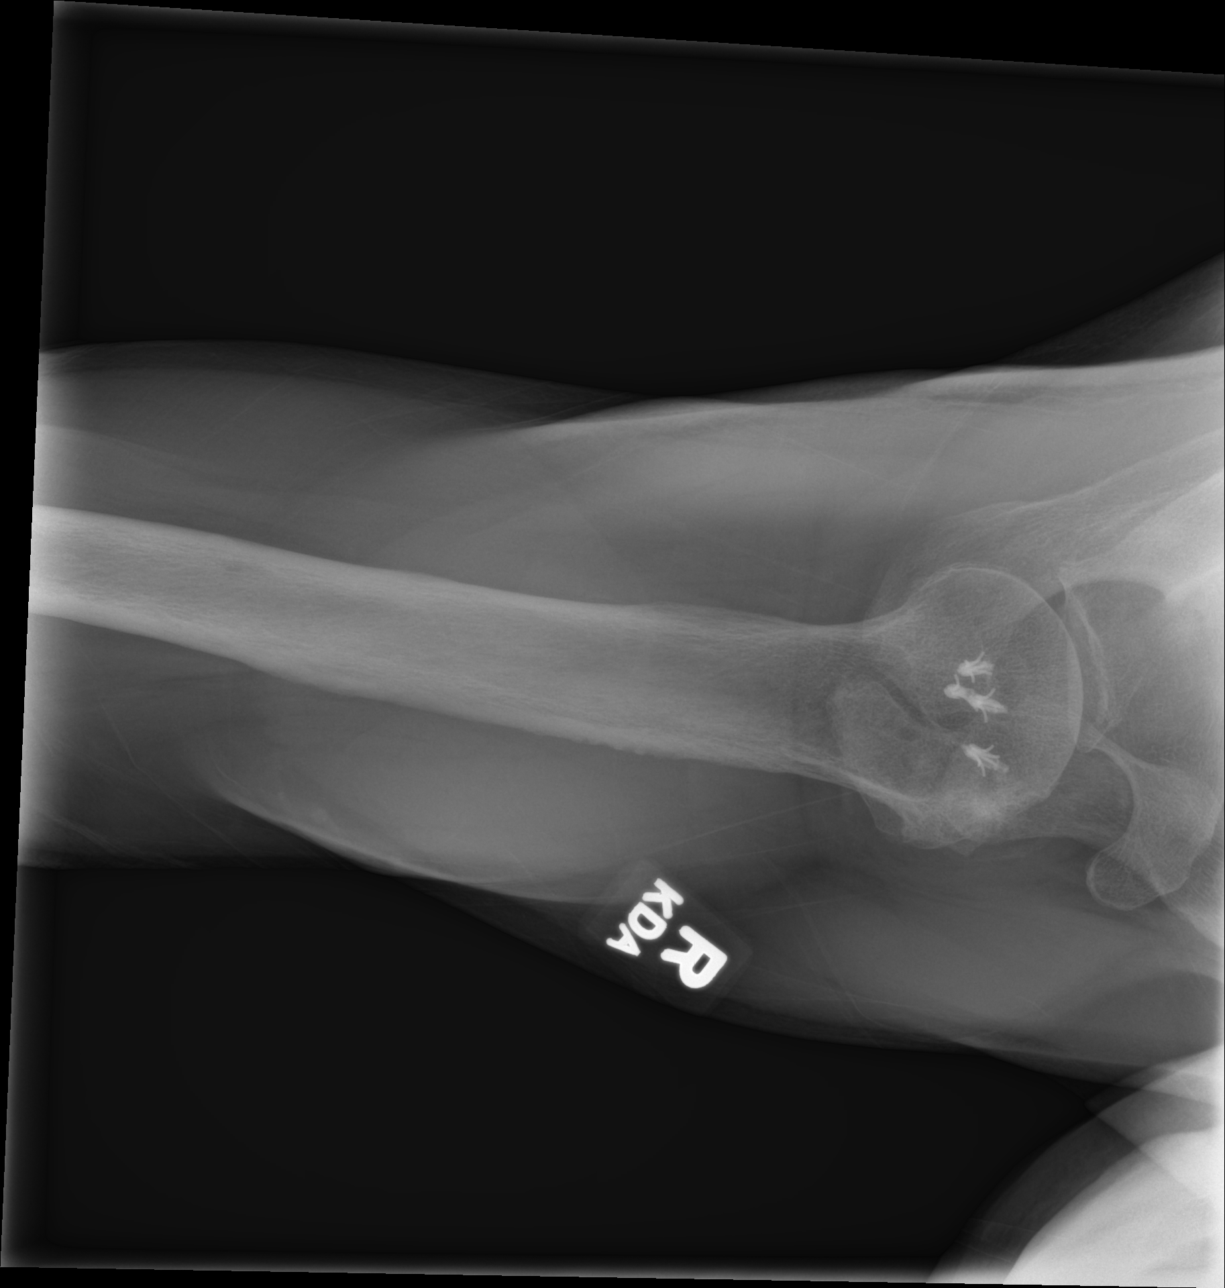

[3 of 3 positions shown; findings below may reference images not displayed]

FINDINGS: No fracture. No glenohumeral dislocation. No evidence of
acromioclavicular separation. High-riding right humeral head, new
from prior. No suspicious focal osseous lesions. Soft tissue anchors
overlie lateral right humeral head. Moderate acromioclavicular joint
osteoarthritis. Mild glenohumeral joint osteoarthritis.
IMPRESSION: 1. New high-riding right humeral head, indicative of complete right
rotator cuff tear versus CPPD arthropathy.
2. Moderate right AC joint osteoarthritis.
3. Mild right glenohumeral joint osteoarthritis.

## 2019-11-12 ENCOUNTER — Other Ambulatory Visit: Payer: Self-pay | Admitting: Family Medicine

## 2019-11-12 DIAGNOSIS — F411 Generalized anxiety disorder: Secondary | ICD-10-CM

## 2019-12-02 ENCOUNTER — Encounter: Payer: Self-pay | Admitting: *Deleted

## 2019-12-22 ENCOUNTER — Telehealth: Payer: Self-pay | Admitting: Family Medicine

## 2019-12-22 NOTE — Chronic Care Management (AMB) (Signed)
  Chronic Care Management   Note  12/22/2019 Name: Joshua Bowman MRN: [id number] DOB: 04-03-1946  Joshua Bowman is a 74 y.o. year old male who is a primary care patient of Rakes, Connye Burkitt, FNP. I reached out to [name] by phone today in response to a referral sent by Joshua Bowman health plan.     Joshua Bowman was given information about Chronic Care Management services today including:  1. CCM service includes personalized support from designated clinical staff supervised by his physician, including individualized plan of care and coordination with other care providers 2. 24/7 contact phone numbers for assistance for urgent and routine care needs. 3. Service will only be billed when office clinical staff spend 20 minutes or more in a month to coordinate care. 4. Only one practitioner may furnish and bill the service in a calendar month. 5. The patient may stop CCM services at any time (effective at the end of the month) by phone call to the office staff. 6. The patient will be responsible for cost sharing (co-pay) of up to 20% of the service fee (after annual deductible is met).  Patient's wife Joshua Bowman  did not agree to enrollment in care management services and does not wish to consider at this time.  Follow up plan: The patient's wife has been provided with contact information for the care management team and has been advised to call with any health related questions or concerns.   Noreene Larsson, [place], [place], [place] [address] Direct Dial: [phone] [email or web address]: [email or web address]

## 2019-12-23 ENCOUNTER — Ambulatory Visit: Payer: Medicare Other | Admitting: Urology

## 2020-01-05 ENCOUNTER — Other Ambulatory Visit: Payer: Self-pay

## 2020-01-05 ENCOUNTER — Other Ambulatory Visit: Payer: Self-pay | Admitting: Family Medicine

## 2020-01-05 ENCOUNTER — Ambulatory Visit (INDEPENDENT_AMBULATORY_CARE_PROVIDER_SITE_OTHER): Payer: Medicare Other | Admitting: Family Medicine

## 2020-01-05 ENCOUNTER — Encounter: Payer: Self-pay | Admitting: Family Medicine

## 2020-01-05 VITALS — BP 121/83 | HR 102 | Temp 99.8°F | Ht 66.0 in | Wt 168.2 lb

## 2020-01-05 DIAGNOSIS — F411 Generalized anxiety disorder: Secondary | ICD-10-CM

## 2020-01-05 DIAGNOSIS — M8949 Other hypertrophic osteoarthropathy, multiple sites: Secondary | ICD-10-CM

## 2020-01-05 DIAGNOSIS — M25511 Pain in right shoulder: Secondary | ICD-10-CM

## 2020-01-05 DIAGNOSIS — I10 Essential (primary) hypertension: Secondary | ICD-10-CM | POA: Diagnosis not present

## 2020-01-05 DIAGNOSIS — M159 Polyosteoarthritis, unspecified: Secondary | ICD-10-CM

## 2020-01-05 DIAGNOSIS — G8929 Other chronic pain: Secondary | ICD-10-CM

## 2020-01-05 DIAGNOSIS — F339 Major depressive disorder, recurrent, unspecified: Secondary | ICD-10-CM

## 2020-01-05 MED ORDER — LOSARTAN POTASSIUM 50 MG PO TABS: 50.0000 mg | ORAL_TABLET | Freq: Every day | ORAL | 6 refills | Status: DC

## 2020-01-05 MED ORDER — HYDROCODONE-ACETAMINOPHEN 5-325 MG PO TABS: 1.0000 | ORAL_TABLET | Freq: Four times a day (QID) | ORAL | 0 refills | Status: DC | PRN

## 2020-01-05 MED ORDER — BUSPIRONE HCL 7.5 MG PO TABS: 7.5000 mg | ORAL_TABLET | Freq: Three times a day (TID) | ORAL | 1 refills | Status: DC

## 2020-01-05 MED ORDER — HYDROCODONE-ACETAMINOPHEN 5-325 MG PO TABS: 1.0000 | ORAL_TABLET | Freq: Four times a day (QID) | ORAL | 0 refills | Status: AC | PRN

## 2020-01-05 NOTE — Patient Instructions (Signed)

## 2020-01-05 NOTE — Progress Notes (Signed)
Subjective:  Patient ID: Joshua Bowman, male    DOB: 01-15-1946, 74 y.o.   MRN: [id number]  Patient Care Team: Claretta Fraise, MD as PCP - General (Family Medicine) Minus Breeding, MD as Consulting Physician (Cardiology) Susa Day, MD as Consulting Physician (Orthopedic Surgery) Gatha Mayer, MD as Consulting Physician (Gastroenterology) Harlen Labs, MD as Referring Physician (Optometry) Hayden Pedro, MD as Consulting Physician (Ophthalmology)   Chief Complaint:  Medical Management of Chronic Issues   HPI: Joshua Bowman is a 74 y.o. male presenting on 01/05/2020 for Medical Management of Chronic Issues   1. Chronic pain in right shoulder 2. Primary osteoarthritis involving multiple joints Pain assessment: Cause of pain- osteoarthritis of multiple joints Pain location- shoulders, knees, back Pain on scale of 1-10- 6/10 Frequency- daily What increases pain-activity, exertion, certain movements What makes pain Better-rest and medication Effects on ADL - minimal Any change in general medical condition-no  Current opioids rx- Hydrocodone / Acetaminophen 5/325 mg # meds rx- #90 Effectiveness of current meds-great relief of pain, some days with more need for medications Adverse reactions form pain meds-none Morphine equivalent- 20 MEDD  Pill count performed-No Last drug screen - 05/05/2019 Urine drug screen today- No Was the [place] reviewed- yes  If yes were their any concerning findings? - no  Opioid Risk  05/18/2019  Alcohol 3  Illegal Drugs 0  Rx Drugs 0  Alcohol 3  Illegal Drugs 0  Rx Drugs 0  Age between 16-45 years  0  History of Preadolescent Sexual Abuse 0  Psychological Disease 0  Depression 1  Opioid Risk Tool Scoring 7  Opioid Risk Interpretation Moderate Risk   Pain contract signed on:05/05/2019   3. Essential hypertension Compliant with medications and tolerating well. Home blood pressure logs look great. Has been avoiding salt in  diet. No headaches, chest pain, shortness of breath, or leg swelling.   4. GAD (generalized anxiety disorder) Feels his anxiety can be worse at times, especially when inactive. States he has tried SSRI and SNRI therapy in the past and did not tolerate them. States the Buspar is doing ok for symptom management.  GAD 7 : Generalized Anxiety Score 01/05/2020 10/19/2019 07/20/2019 05/18/2019  Nervous, Anxious, on Edge 2 1 1 1   Control/stop worrying 1 1 1 1   Worry too much - different things 1 1 0 1  Trouble relaxing 2 2 1  0  Restless 1 1 1  0  Easily annoyed or irritable 1 1 1  0  Afraid - awful might happen 0 1 0 0  Total GAD 7 Score 8 8 5 3   Anxiety Difficulty Not difficult at all Somewhat difficult Not difficult at all -     5. Depression, recurrent ([place]) Feels depression symptoms are well controlled.    Depression screen [facility] 2/9 01/05/2020 10/19/2019 07/20/2019 05/18/2019 05/04/2019  Decreased Interest 1 1 1  0 0  Down, Depressed, Hopeless 0 1 1 1 1   PHQ - 2 Score 1 2 2 1 1   Altered sleeping 1 2 1  - -  Tired, decreased energy 1 1 1  0 -  Change in appetite 0 0 0 0 -  Feeling bad or failure about yourself  0 0 0 0 -  Trouble concentrating 0 0 0 1 -  Moving slowly or fidgety/restless 0 1 0 0 -  Suicidal thoughts 0 0 0 0 -  PHQ-9 Score 3 6 4  - -  Difficult doing work/chores Not difficult at all Somewhat  difficult Not difficult at all - -  Some recent data might be hidden      Relevant past medical, surgical, family, and social history reviewed and updated as indicated.  Allergies and medications reviewed and updated. Date reviewed: Chart in Epic.   Past Medical History:  Diagnosis Date  . Anxiety   . Arthritis   . Bilateral carotid artery stenosis    Minimal 2012   . Colon polyps - attenuated polyposis syndrome    multiple   . COPD (chronic obstructive pulmonary disease) ([place])   . Duodenitis   . Gastritis   . Hemorrhoids   . Hyperlipidemia   . Hyperplasia of prostate   . IBS  (irritable bowel syndrome)   . Pre-diabetes   . Sigmoid diverticulosis    mild     Past Surgical History:  Procedure Laterality Date  . APPENDECTOMY  1969  . COLONOSCOPY    . ESOPHAGOGASTRODUODENOSCOPY    . EYE SURGERY     laser - left eye - Dr Zigmund Daniel  . lipoma rt shoulder  01/2001   outpatient   . nasal surgery ( spurs )  70's  . rt. shoulder -rotator cuff repair  08/2005   Dr. Maxie Better   . TONSILLECTOMY      Social History   Socioeconomic History  . Marital status: Married    Spouse name: dale   . Number of children: 1  . Years of education: Not on file  . Highest education level: Not on file  Occupational History  . 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 Smoking status: Current Every Day Smoker    Packs/day: 0.50    Types: Cigarettes    Start date: 09/29/1980  . Smokeless tobacco: Former Network engineer and Sexual Activity  . Alcohol use: No  . Drug use: No  . Sexual activity: Not on file  Other Topics Concern  . Not on file  Social History Narrative   Retired from a nursing home.  Lives with second wife.    Social Determinants of Health   Financial Resource Strain:   . Difficulty of Paying Living Expenses:   Food Insecurity:   . Worried About Charity fundraiser in the Last Year:   . Arboriculturist in the Last Year:   Transportation Needs:   . Film/video editor (Medical):   Marland Kitchen Lack of Transportation (Non-Medical):   Physical Activity:   . Days of Exercise per Week:   . Minutes of Exercise per Session:   Stress:   . Feeling of Stress :   Social Connections:   . Frequency of Communication with Friends and Family:   . Frequency of Social Gatherings with Friends and Family:   . Attends Religious Services:   . Active Member of Clubs or Organizations:   . Attends Archivist Meetings:   Marland Kitchen Marital Status:   Intimate Partner Violence:   . Fear of Current or Ex-Partner:   . Emotionally Abused:     Marland Kitchen Physically Abused:   . Sexually Abused:     Outpatient Encounter Medications as of 01/05/2020  Medication Sig  . aspirin 325 MG tablet Take 325 mg by mouth daily.  . budesonide-formoterol (SYMBICORT) 160-4.5 MCG/ACT inhaler INHALE 2 PUFFS BY MOUTH INTO THE LUNGS TWICE A DAY (RINSE MOUTH AFTER EACH USE)  . Cholecalciferol (VITAMIN D3) 5000 UNITS CAPS Take 1 tablet by mouth. 2-3 x week  .  diclofenac sodium (VOLTAREN) 1 % GEL Apply 4 g topically 2 (two) times daily.  Derrill Memo ON 03/05/2020] HYDROcodone-acetaminophen (NORCO/VICODIN) 5-325 MG tablet Take 1 tablet by mouth every 6 (six) hours as needed for moderate pain.  Marland Kitchen HYDROcodone-acetaminophen (NORCO/VICODIN) 5-325 MG tablet Take 1 tablet by mouth every 6 (six) hours as needed for moderate pain.  Marland Kitchen losartan (COZAAR) 50 MG tablet Take 1 tablet (50 mg total) by mouth daily.  . [DISCONTINUED] finasteride (PROSCAR) 5 MG tablet Take 1 tablet (5 mg total) by mouth at bedtime.  . [DISCONTINUED] HYDROcodone-acetaminophen (NORCO/VICODIN) 5-325 MG tablet Take 1 tablet by mouth every 6 (six) hours as needed for moderate pain.  . [DISCONTINUED] HYDROcodone-acetaminophen (NORCO/VICODIN) 5-325 MG tablet Take 1 tablet by mouth every 6 (six) hours as needed for moderate pain.  . [DISCONTINUED] losartan (COZAAR) 50 MG tablet Take 1 tablet (50 mg total) by mouth daily.  . busPIRone (BUSPAR) 7.5 MG tablet Take 1 tablet (7.5 mg total) by mouth 3 (three) times daily.  Derrill Memo ON 02/04/2020] HYDROcodone-acetaminophen (NORCO/VICODIN) 5-325 MG tablet Take 1 tablet by mouth every 6 (six) hours as needed for moderate pain.   No facility-administered encounter medications on file as of 01/05/2020.    Allergies  Allergen Reactions  . Lipitor [Atorvastatin] Other (See Comments)    myalgias  . Sulfa Antibiotics Itching and Rash    Review of Systems  Constitutional: Negative for activity change, appetite change, chills, diaphoresis, fatigue, fever and unexpected weight  change.  HENT: Negative.   Eyes: Negative.  Negative for photophobia and visual disturbance.  Respiratory: Negative for cough, chest tightness and shortness of breath.   Cardiovascular: Negative for chest pain, palpitations and leg swelling.  Gastrointestinal: Negative for abdominal pain, blood in stool, constipation, diarrhea, nausea and vomiting.  Endocrine: Negative.  Negative for cold intolerance, heat intolerance, polydipsia, polyphagia and polyuria.  Genitourinary: Negative for decreased urine volume, difficulty urinating, dysuria, frequency and urgency.  Musculoskeletal: Positive for arthralgias, back pain, gait problem, joint swelling and myalgias.  Skin: Negative.   Allergic/Immunologic: Negative.   Neurological: Negative for dizziness, tremors, seizures, syncope, facial asymmetry, speech difficulty, weakness, light-headedness, numbness and headaches.  Hematological: Negative.   Psychiatric/Behavioral: Positive for agitation, decreased concentration, dysphoric mood and sleep disturbance. Negative for behavioral problems, confusion, hallucinations, self-injury and suicidal ideas. The patient is nervous/anxious. The patient is not hyperactive.   All other systems reviewed and are negative.       Objective:  BP 121/83   Pulse (!) 102   Temp 99.8 F (37.7 C)   Ht 5\' 6"  (1.676 m)   Wt 168 lb 3.2 oz (76.3 kg)   SpO2 97%   BMI 27.15 kg/m    Wt Readings from Last 3 Encounters:  01/05/20 168 lb 3.2 oz (76.3 kg)  10/19/19 171 lb (77.6 kg)  07/20/19 168 lb (76.2 kg)    Physical Exam Vitals and nursing note reviewed.  Constitutional:      General: He is not in acute distress.    Appearance: Normal appearance. He is well-developed, well-groomed and overweight. He is not ill-appearing, toxic-appearing or diaphoretic.  HENT:     Head: Normocephalic and atraumatic.     Jaw: There is normal jaw occlusion.     Right Ear: Hearing normal.     Left Ear: Hearing normal.     Nose:  Nose normal.     Mouth/Throat:     Lips: Pink.     Mouth: Mucous membranes are moist.  Pharynx: Oropharynx is clear. Uvula midline.  Eyes:     General: Lids are normal.     Extraocular Movements: Extraocular movements intact.     Conjunctiva/sclera: Conjunctivae normal.     Pupils: Pupils are equal, round, and reactive to light.  Neck:     Thyroid: No thyroid mass, thyromegaly or thyroid tenderness.     Vascular: No carotid bruit or JVD.     Trachea: Trachea and phonation normal.  Cardiovascular:     Rate and Rhythm: Normal rate and regular rhythm.     Chest Wall: PMI is not displaced.     Pulses: Normal pulses.     Heart sounds: Normal heart sounds. No murmur. No friction rub. No gallop.   Pulmonary:     Effort: Pulmonary effort is normal. No respiratory distress.     Breath sounds: Normal breath sounds. No wheezing.  Abdominal:     General: Bowel sounds are normal. There is no distension or abdominal bruit.     Palpations: Abdomen is soft. There is no hepatomegaly or splenomegaly.     Tenderness: There is no abdominal tenderness. There is no right CVA tenderness or left CVA tenderness.     Hernia: No hernia is present.  Musculoskeletal:     Right shoulder: Decreased range of motion. Normal strength.     Left shoulder: Decreased range of motion. Normal strength.     Right elbow: Normal.     Left elbow: Normal.     Right wrist: No swelling or tenderness. Decreased range of motion.     Left wrist: No swelling or tenderness. Decreased range of motion.     Right hand: Swelling present. Decreased range of motion.     Left hand: Swelling present. Decreased range of motion.     Cervical back: Normal, normal range of motion and neck supple.     Thoracic back: Normal.     Lumbar back: Tenderness present. No swelling, edema, deformity, signs of trauma, lacerations, spasms or bony tenderness. Decreased range of motion. Negative right straight leg raise test and negative left straight  leg raise test. No scoliosis.     Right hip: Normal.     Left hip: Normal.     Right knee: Swelling present. Decreased range of motion. Tenderness present.     Left knee: Swelling present. Decreased range of motion. Tenderness present.     Right lower leg: No edema.     Left lower leg: No edema.     Right ankle: Normal.     Left ankle: Normal.  Lymphadenopathy:     Cervical: No cervical adenopathy.  Skin:    General: Skin is warm and dry.     Capillary Refill: Capillary refill takes less than 2 seconds.     Coloration: Skin is not cyanotic, jaundiced or pale.     Findings: No rash.  Neurological:     General: No focal deficit present.     Mental Status: He is alert and oriented to person, place, and time.     Cranial Nerves: Cranial nerves are intact. No cranial nerve deficit.     Sensory: Sensation is intact. No sensory deficit.     Motor: Motor function is intact. No weakness.     Coordination: Coordination is intact. Coordination normal.     Gait: Gait abnormal (antalgic).     Deep Tendon Reflexes: Reflexes are normal and symmetric. Reflexes normal.  Psychiatric:        Attention and Perception: Attention  and perception normal.        Mood and Affect: Mood and affect normal.        Speech: Speech normal.        Behavior: Behavior normal. Behavior is cooperative.        Thought Content: Thought content normal.        Cognition and Memory: Cognition and memory normal.        Judgment: Judgment normal.     Results for orders placed or performed in visit on 10/19/19  PSA, total and free  Result Value Ref Range   Prostate Specific Ag, Serum 2.4 0.0 - 4.0 ng/mL   PSA, Free 0.37 N/A ng/mL   PSA, Free Pct 15.4 %       Pertinent labs & imaging results that were available during my care of the patient were reviewed by me and considered in my medical decision making.  Assessment & Plan:  Myshon was seen today for medical management of chronic issues.  Diagnoses and all orders  for this visit:  Chronic pain in right shoulder Primary osteoarthritis involving multiple joints Doing well on current regimen. Will continue below.  -     HYDROcodone-acetaminophen (NORCO/VICODIN) 5-325 MG tablet; Take 1 tablet by mouth every 6 (six) hours as needed for moderate pain. -     HYDROcodone-acetaminophen (NORCO/VICODIN) 5-325 MG tablet; Take 1 tablet by mouth every 6 (six) hours as needed for moderate pain. -     HYDROcodone-acetaminophen (NORCO/VICODIN) 5-325 MG tablet; Take 1 tablet by mouth every 6 (six) hours as needed for moderate pain.  Essential hypertension BP well controlled. Changes were not made in regimen today. Goal BP is 130/80. Pt aware to report any persistent high or low readings. DASH diet and exercise encouraged. Exercise at least 150 minutes per week and increase as tolerated. Goal BMI > 25. Stress management encouraged. Avoid nicotine and tobacco product use. Avoid excessive alcohol and NSAID's. Avoid more than 2000 mg of sodium daily. Medications as prescribed. Follow up as scheduled.  -     losartan (COZAAR) 50 MG tablet; Take 1 tablet (50 mg total) by mouth daily.  GAD (generalized anxiety disorder) Denies SSRI or SNRI therapy. Doing well with below. Will continue.  -     busPIRone (BUSPAR) 7.5 MG tablet; Take 1 tablet (7.5 mg total) by mouth 3 (three) times daily.  Depression, recurrent ([place]) Well controlled at this time. Pt aware to report any new, worsening, or persistent symptoms.     Continue all other maintenance medications.  Follow up plan: Return in about 3 months (around 04/05/2020), or if symptoms worsen or fail to improve, for chronic pain.    Continue healthy lifestyle choices, including diet (rich in fruits, vegetables, and lean proteins, and low in salt and simple carbohydrates) and exercise (at least 30 minutes of moderate physical activity daily).  Educational handout given for chronic pain  The above assessment and management plan  was discussed with the patient. The patient verbalized understanding of and has agreed to the management plan. Patient is aware to call the clinic if they develop any new symptoms or if symptoms persist or worsen. Patient is aware when to return to the clinic for a follow-up visit. Patient educated on when it is appropriate to go to the emergency department.   Monia Pouch, FNP-C [facility] [phone]

## 2020-01-12 ENCOUNTER — Other Ambulatory Visit: Payer: Self-pay | Admitting: Family Medicine

## 2020-01-12 DIAGNOSIS — N3943 Post-void dribbling: Secondary | ICD-10-CM

## 2020-01-16 ENCOUNTER — Ambulatory Visit: Payer: Medicare Other | Admitting: Family Medicine

## 2020-02-03 ENCOUNTER — Ambulatory Visit: Payer: Medicare Other | Admitting: Urology

## 2020-02-21 ENCOUNTER — Other Ambulatory Visit: Payer: Self-pay | Admitting: *Deleted

## 2020-02-21 DIAGNOSIS — F411 Generalized anxiety disorder: Secondary | ICD-10-CM

## 2020-02-21 MED ORDER — BUSPIRONE HCL 7.5 MG PO TABS: 7.5000 mg | ORAL_TABLET | Freq: Three times a day (TID) | ORAL | 1 refills | Status: DC

## 2020-02-23 ENCOUNTER — Other Ambulatory Visit: Payer: Self-pay | Admitting: *Deleted

## 2020-02-23 ENCOUNTER — Telehealth: Payer: Self-pay | Admitting: *Deleted

## 2020-02-23 ENCOUNTER — Ambulatory Visit (INDEPENDENT_AMBULATORY_CARE_PROVIDER_SITE_OTHER): Payer: Medicare Other | Admitting: *Deleted

## 2020-02-23 VITALS — BP 129/74 | HR 96 | Ht 66.0 in | Wt 168.0 lb

## 2020-02-23 DIAGNOSIS — Z1211 Encounter for screening for malignant neoplasm of colon: Secondary | ICD-10-CM | POA: Diagnosis not present

## 2020-02-23 DIAGNOSIS — Z0000 Encounter for general adult medical examination without abnormal findings: Secondary | ICD-10-CM

## 2020-02-23 DIAGNOSIS — F411 Generalized anxiety disorder: Secondary | ICD-10-CM

## 2020-02-23 MED ORDER — BUSPIRONE HCL 7.5 MG PO TABS: 7.5000 mg | ORAL_TABLET | Freq: Three times a day (TID) | ORAL | 1 refills | Status: DC

## 2020-02-23 NOTE — Telephone Encounter (Signed)
While on the phone with patient during AWV patient states that he has been suffering with Depression and Anxiety.  Was prescribed Lorazepam previously by Dr. Laurance Flatten and patient states that this medication helped, patient was also prescribed Hydrocodone by Dr. Laurance Flatten and states that he has been taking this for 12 years.  Lorazepam was discontinued by Monia Pouch, FNP and patient was started on Buspirone, Hydrocodone Rx was given and patient to wean off. Patient states that he feels like the Buspirone with not helping and also states that he tried to stop Hydrocodone "cold [place]" a month or so ago and experienced withdrawals so patient reverted back to Alcohol to help with the withdrawal effects of the hydrocodone and to help with anxiety and depression.  Patient states that he was drinking about 10 beers daily but quit 2 weeks ago.  Patient also states that on 02/22/2020 he picked up refill of Hydrocodone and patient is now taking 1/2 tablet daily to help get this medication out of his system and does not want to revert back to alcohol to mask the pain, depression and anxiety.  Patient also states that he had an old prescription of Alprazolam and has taken 2 in the last 2 weeks to help with the Anxiety Attacks and states that the Buspirone is "just not taking the Edge off".  Patient does not want to take any addictive medications due to past history of Alcohol abuse, Hydrocodone use and Ativan use.  Patient states that he has always taken Hydrocodone and Ativan as prescribed and never abuse these medications.  PHQ9 with score of 18 and GAD7 with score of 13 was done during Televisit AWV.  No suicidal thoughts or attempts and no thoughts about hurting himself or anyone else.  Has a follow up appt scheduled for 04/06/2020, Advised that I think he will need to be seen sooner.  Appt made for Wednesday 02/29/2020 at 1:40 with Dr. Livia Snellen.  Patient aware of this appt

## 2020-02-23 NOTE — Telephone Encounter (Signed)
Thanks for the assessment. I will be happy to see him.

## 2020-02-23 NOTE — Telephone Encounter (Signed)
Apt has been scheduled.

## 2020-02-23 NOTE — Patient Instructions (Addendum)
[place] Maintenance Summary and Written Plan of Care  Joshua Bowman ,  Thank you for allowing me to perform your Medicare Annual Wellness Visit and for your ongoing commitment to your health.   Health Maintenance & Immunization History Health Maintenance  Topic Date Due  . COVID-19 Vaccine (1) Never done  . COLONOSCOPY  11/19/2018  . INFLUENZA VACCINE  04/29/2020  . TETANUS/TDAP  12/04/2027  . Hepatitis C Screening  Completed  . PNA vac Low Risk Adult  Completed   Immunization History  Administered Date(s) Administered  . Fluad Quad(high Dose 65+) 07/20/2019  . Influenza Whole 06/20/2015  . Influenza, High Dose Seasonal PF 06/20/2016, 07/22/2018  . Influenza,inj,Quad PF,6+ Mos 08/09/2014, 08/04/2017  . Pneumococcal Conjugate-13 12/06/2013  . Pneumococcal Polysaccharide-23 11/19/2011  . Td 12/03/2017    These are the patient goals that we discussed: Goals Addressed            This Visit's Progress   . Exercise 3x per week (30 min per time)       02/23/2020 AWV Goal: Exercise for General Health   Patient will verbalize understanding of the benefits of increased physical activity:  Exercising regularly is important. It will improve your overall fitness, flexibility, and endurance.  Regular exercise also will improve your overall health. It can help you control your weight, reduce stress, and improve your bone density.  Over the next year, patient will increase physical activity as tolerated with a goal of at least 150 minutes of moderate physical activity per week.   You can tell that you are exercising at a moderate intensity if your heart starts beating faster and you start breathing faster but can still hold a conversation.  Moderate-intensity exercise ideas include:  Walking 1 mile (1.6 km) in about 15 minutes  Biking  Hiking  Golfing  Dancing  Water aerobics  Patient will verbalize understanding of everyday activities that  increase physical activity by providing examples like the following: ? Yard work, such as: ? Pushing a Conservation officer, nature ? Raking and bagging leaves ? Washing your car ? Pushing a stroller ? Shoveling snow ? Gardening ? Washing windows or floors  Patient will be able to explain general safety guidelines for exercising:   Before you start a new exercise program, talk with your health care provider.  Do not exercise so much that you hurt yourself, feel dizzy, or get very short of breath.  Wear comfortable clothes and wear shoes with good support.  Drink plenty of water while you exercise to prevent dehydration or heat stroke.  Work out until your breathing and your heartbeat get faster.     . Quit Smoking       02/23/2020 AWV Goal: Tobacco Cessation  Smoking cessation instruction/counseling given:  counseled patient on the dangers of tobacco use, advised patient to stop smoking, and reviewed strategies to maximize success   Patient will verbalize understanding of the health risks associated with smoking/tobacco use  Lung cancer or lung disease, such as COPD  Heart disease.  Stroke.  Heart attack  Infertility  Osteoporosis and bone fractures. . Patient will create a plan to quit smoking/using tobacco  Pick a date to quit.   Write down the reasons why you are quitting and put it where you will see it often.  Identify the people, places, things, and activities that make you want to smoke (triggers) and avoid them. Make sure to take these actions: ? Throw away all cigarettes at  home, at work, and in your car. ? Throw away smoking accessories, such as Scientist, research (medical). ? Clean your car and make sure to empty the ashtray. ? Clean your home, including curtains and carpets.  Tell your family, friends, and coworkers that you are quitting. Support from your loved ones can make quitting easier.  Talk with your health care provider about your options for quitting  smoking.  Find out what treatment options are covered by your health insurance. . Patient will be able to demonstrate knowledge of tobacco cessation strategies that may maximize success  Quitting "cold [place]" is more successful than gradually quitting.  Attending in-person counseling to help you build problem-solving skills.   Finding resources and support systems that can help you to quit smoking and remain smoke-free after you quit. These resources are most helpful when you use them often. They can include: ? Online chats with a Social worker. ? Telephone quitlines. ? Careers information officer. ? Support groups or group counseling. ? Text messaging programs. ? Mobile phone applications.  Taking medicines to help you quit smoking: ? Nicotine patches, gum, or lozenges. ? Nicotine inhalers or sprays. ? Non-nicotine medicine that is taken by mouth. . Patient will note get discouraged if the process is difficult . Over the next year, patient will stop smoking or using other forms of tobacco  Smoking cessation instruction/counseling given:  counseled patient on the dangers of tobacco use, advised patient to stop smoking, and reviewed strategies to maximize success          This is a list of Health Maintenance Items that are overdue or due now: Health Maintenance Due  Topic Date Due  . COVID-19 Vaccine (1) Never done  . COLONOSCOPY  11/19/2018     Orders/Referrals Placed Today: No orders of the defined types were placed in this encounter.  (Contact our referral department at [phone] if you have not spoken with someone about your referral appointment within the next 5 days)    Follow-up Plan Follow up with Dr. Livia Snellen as scheduled    Steps to Quit Smoking Smoking tobacco is the leading cause of preventable death. It can affect almost every organ in the body. Smoking puts you and people around you at risk for many serious, long-lasting (chronic) diseases. Quitting smoking  can be hard, but it is one of the best things that you can do for your health. It is never too late to quit. How do I get ready to quit? When you decide to quit smoking, make a plan to help you succeed. Before you quit:  Pick a date to quit. Set a date within the next 2 weeks to give you time to prepare.  Write down the reasons why you are quitting. Keep this list in places where you will see it often.  Tell your family, friends, and co-workers that you are quitting. Their support is important.  Talk with your doctor about the choices that may help you quit.  Find out if your health insurance will pay for these treatments.  Know the people, places, things, and activities that make you want to smoke (triggers). Avoid them. What first steps can I take to quit smoking?  Throw away all cigarettes at home, at work, and in your car.  Throw away the things that you use when you smoke, such as ashtrays and lighters.  Clean your car. Make sure to empty the ashtray.  Clean your home, including curtains and carpets. What can I do to  help me quit smoking? Talk with your doctor about taking medicines and seeing a counselor at the same time. You are more likely to succeed when you do both.  If you are pregnant or breastfeeding, talk with your doctor about counseling or other ways to quit smoking. Do not take medicine to help you quit smoking unless your doctor tells you to do so. To quit smoking: Quit right away  Quit smoking totally, instead of slowly cutting back on how much you smoke over a period of time.  Go to counseling. You are more likely to quit if you go to counseling sessions regularly. Take medicine You may take medicines to help you quit. Some medicines need a prescription, and some you can buy over-the-counter. Some medicines may contain a drug called nicotine to replace the nicotine in cigarettes. Medicines may:  Help you to stop having the desire to smoke (cravings).  Help  to stop the problems that come when you stop smoking (withdrawal symptoms). Your doctor may ask you to use:  Nicotine patches, gum, or lozenges.  Nicotine inhalers or sprays.  Non-nicotine medicine that is taken by mouth. Find resources Find resources and other ways to help you quit smoking and remain smoke-free after you quit. These resources are most helpful when you use them often. They include:  Online chats with a Social worker.  Phone quitlines.  Printed Furniture conservator/restorer.  Support groups or group counseling.  Text messaging programs.  Mobile phone apps. Use apps on your mobile phone or tablet that can help you stick to your quit plan. There are many free apps for mobile phones and tablets as well as websites. Examples include Quit Guide from the [employer] and smokefree.gov  What things can I do to make it easier to quit?   Talk to your family and friends. Ask them to support and encourage you.  Call a phone quitline (1-800-QUIT-NOW), reach out to support groups, or work with a Social worker.  Ask people who smoke to not smoke around you.  Avoid places that make you want to smoke, such as: ? Bars. ? Parties. ? Smoke-break areas at work.  Spend time with people who do not smoke.  Lower the stress in your life. Stress can make you want to smoke. Try these things to help your stress: ? Getting regular exercise. ? Doing deep-breathing exercises. ? Doing yoga. ? Meditating. ? Doing a body scan. To do this, close your eyes, focus on one area of your body at a time from head to toe. Notice which parts of your body are tense. Try to relax the muscles in those areas. How will I feel when I quit smoking? Day 1 to 3 weeks Within the first 24 hours, you may start to have some problems that come from quitting tobacco. These problems are very bad 2-3 days after you quit, but they do not often last for more than 2-3 weeks. You may get these symptoms:  Mood swings.  Feeling restless,  nervous, angry, or annoyed.  Trouble concentrating.  Dizziness.  Strong desire for high-sugar foods and nicotine.  Weight gain.  Trouble pooping (constipation).  Feeling like you may vomit (nausea).  Coughing or a sore throat.  Changes in how the medicines that you take for other issues work in your body.  Depression.  Trouble sleeping (insomnia). Week 3 and afterward After the first 2-3 weeks of quitting, you may start to notice more positive results, such as:  Better sense of smell and taste.  Less coughing and sore throat.  Slower heart rate.  Lower blood pressure.  Clearer skin.  Better breathing.  Fewer sick days. Quitting smoking can be hard. Do not give up if you fail the first time. Some people need to try a few times before they succeed. Do your best to stick to your quit plan, and talk with your doctor if you have any questions or concerns. Summary  Smoking tobacco is the leading cause of preventable death. Quitting smoking can be hard, but it is one of the best things that you can do for your health.  When you decide to quit smoking, make a plan to help you succeed.  Quit smoking right away, not slowly over a period of time.  When you start quitting, seek help from your doctor, family, or friends. This information is not intended to replace advice given to you by your health care provider. Make sure you discuss any questions you have with your health care provider. Document Revised: 06/10/2019 Document Reviewed: 12/04/2018 Elsevier Patient Education  [place] Directive  Advance directives are legal documents that let you make choices ahead of time about your health care and medical treatment in case you become unable to communicate for yourself. Advance directives are a way for you to make known your wishes to family, friends, and health care providers. This can let others know about your end-of-life care if you become unable to  communicate. Discussing and writing advance directives should happen over time rather than all at once. Advance directives can be changed depending on your situation and what you want, even after you have signed the advance directives. There are different types of advance directives, such as:  Medical power of attorney.  Living will.  Do not resuscitate (DNR) or do not attempt resuscitation (DNAR) order. Health care proxy and medical power of attorney A health care proxy is also called a health care agent. This is a person who is appointed to make medical decisions for you in cases where you are unable to make the decisions yourself. Generally, people choose someone they know well and trust to represent their preferences. Make sure to ask this person for an agreement to act as your proxy. A proxy may have to exercise judgment in the event of a medical decision for which your wishes are not known. A medical power of attorney is a legal document that names your health care proxy. Depending on the laws in your state, after the document is written, it may also need to be:  Signed.  Notarized.  Dated.  Copied.  Witnessed.  Incorporated into your medical record. You may also want to appoint someone to manage your money in a situation in which you are unable to do so. This is called a durable power of attorney for finances. It is a separate legal document from the durable power of attorney for health care. You may choose the same person or someone different from your health care proxy to act as your agent in money matters. If you do not appoint a proxy, or if there is a concern that the proxy is not acting in your best interests, a court may appoint a guardian to act on your behalf. Living will A living will is a set of instructions that state your wishes about medical care when you cannot express them yourself. Health care providers should keep a copy of your living will in your medical record.  You may  want to give a copy to family members or friends. To alert caregivers in case of an emergency, you can place a card in your wallet to let them know that you have a living will and where they can find it. A living will is used if you become:  Terminally ill.  Disabled.  Unable to communicate or make decisions. Items to consider in your living will include:  To use or not to use life-support equipment, such as dialysis machines and breathing machines (ventilators).  A DNR or DNAR order. This tells health care providers not to use cardiopulmonary resuscitation (CPR) if breathing or heartbeat stops.  To use or not to use tube feeding.  To be given or not to be given food and fluids.  Comfort (palliative) care when the goal becomes comfort rather than a cure.  Donation of organs and tissues. A living will does not give instructions for distributing your money and property if you should pass away. DNR or DNAR A DNR or DNAR order is a request not to have CPR in the event that your heart stops beating or you stop breathing. If a DNR or DNAR order has not been made and shared, a health care provider will try to help any patient whose heart has stopped or who has stopped breathing. If you plan to have surgery, talk with your health care provider about how your DNR or DNAR order will be followed if problems occur. What if I do not have an advance directive? If you do not have an advance directive, some states assign family decision makers to act on your behalf based on how closely you are related to them. Each state has its own laws about advance directives. You may want to check with your health care provider, attorney, or state representative about the laws in your state. Summary  Advance directives are the legal documents that allow you to make choices ahead of time about your health care and medical treatment in case you become unable to tell others about your care.  The process of  discussing and writing advance directives should happen over time. You can change the advance directives, even after you have signed them.  Advance directives include DNR or DNAR orders, living wills, and designating an agent as your medical power of attorney. This information is not intended to replace advice given to you by your health care provider. Make sure you discuss any questions you have with your health care provider. Document Revised: 04/14/2019 Document Reviewed: 04/14/2019 Elsevier Patient Education  [place].

## 2020-02-23 NOTE — Progress Notes (Signed)
MEDICARE ANNUAL WELLNESS VISIT  02/23/2020  Telephone Visit Disclaimer This Medicare AWV was conducted by telephone due to national recommendations for restrictions regarding the COVID-19 Pandemic (e.g. social distancing).  I verified, using two identifiers, that I am speaking with Joshua Bowman or their authorized healthcare agent. I discussed the limitations, risks, security, and privacy concerns of performing an evaluation and management service by telephone and the potential availability of an in-person appointment in the future. The patient expressed understanding and agreed to proceed.   Subjective:  Joshua Bowman is a 74 y.o. male patient of Stacks, Cletus Gash, MD who had a Medicare Annual Wellness Visit today via telephone. Joshua Bowman is Retired and lives with their spouse. he has 1 child. he reports that he is socially active and does interact with friends/family regularly. he is minimally physically active and enjoys woodworking.  Patient Care Team: Claretta Fraise, MD as PCP - General (Family Medicine) Minus Breeding, MD as Consulting Physician (Cardiology) Susa Day, MD as Consulting Physician (Orthopedic Surgery) Gatha Mayer, MD as Consulting Physician (Gastroenterology) Harlen Labs, MD as Referring Physician (Optometry) Hayden Pedro, MD as Consulting Physician (Ophthalmology)  Advanced Directives 02/23/2020 02/16/2019 11/19/2017  Does Patient Have a Medical Advance Directive? No No No  Would patient like information on creating a medical advance directive? Yes (MAU/Ambulatory/Procedural Areas - Information given) No - Patient declined No - Patient declined    Hospital Utilization Over the Past 12 Months: # of hospitalizations or ER visits: 0 # of surgeries: 0  Review of Systems    Patient reports that his overall health is worse compared to last year.  History obtained from chart review and the patient General ROS: positive for  - sleep  disturbance Psychological ROS: positive for - anxiety, concentration difficulties, depression, irritability and sleep disturbances  Patient Reported Readings (BP, Pulse, CBG, Weight, etc) BP 129/74 P    96  Pain Assessment Pain : No/denies pain     Current Medications & Allergies (verified) Allergies as of 02/23/2020      Reactions   Lipitor [atorvastatin] Other (See Comments)   myalgias   Sulfa Antibiotics Itching, Rash      Medication List       Accurate as of Feb 23, 2020  3:41 PM. If you have any questions, ask your nurse or doctor.        aspirin 325 MG tablet Take 325 mg by mouth every other day.   budesonide-formoterol 160-4.5 MCG/ACT inhaler Commonly known as: Symbicort INHALE 2 PUFFS BY MOUTH INTO THE LUNGS TWICE A DAY (RINSE MOUTH AFTER EACH USE)   busPIRone 7.5 MG tablet Commonly known as: BUSPAR Take 1 tablet (7.5 mg total) by mouth 3 (three) times daily.   diclofenac sodium 1 % Gel Commonly known as: Voltaren Apply 4 g topically 2 (two) times daily.   finasteride 5 MG tablet Commonly known as: PROSCAR TAKE 1 TABLET BY MOUTH EVERYDAY AT BEDTIME   HYDROcodone-acetaminophen 5-325 MG tablet Commonly known as: NORCO/VICODIN Take 1 tablet by mouth every 6 (six) hours as needed for moderate pain.   HYDROcodone-acetaminophen 5-325 MG tablet Commonly known as: NORCO/VICODIN Take 1 tablet by mouth every 6 (six) hours as needed for moderate pain. Start taking on: March 05, 2020   losartan 50 MG tablet Commonly known as: COZAAR Take 1 tablet (50 mg total) by mouth daily.   Vitamin D3 125 MCG (5000 UT) Caps Take 1 tablet by mouth. 2-3 x week  History (reviewed): Past Medical History:  Diagnosis Date  . Anxiety   . Arthritis   . Bilateral carotid artery stenosis    Minimal 2012   . Colon polyps - attenuated polyposis syndrome    multiple   . COPD (chronic obstructive pulmonary disease) ([place])   . Duodenitis   . Gastritis   . Hemorrhoids    . Hyperlipidemia   . Hyperplasia of prostate   . IBS (irritable bowel syndrome)   . Pre-diabetes   . Sigmoid diverticulosis    mild    Past Surgical History:  Procedure Laterality Date  . APPENDECTOMY  1969  . COLONOSCOPY    . ESOPHAGOGASTRODUODENOSCOPY    . EYE SURGERY     laser - left eye - Dr Zigmund Daniel  . lipoma rt shoulder  01/2001   outpatient   . nasal surgery ( spurs )  70's  . rt. shoulder -rotator cuff repair  08/2005   Dr. Maxie Better   . TONSILLECTOMY     Family History  Problem Relation Age of Onset  . Heart disease Mother        Atrial fib  . Prostate cancer Brother   . Heart disease Brother   . Congestive Heart Failure Father   . Post-traumatic stress disorder Father   . Arthritis Sister        back issues   . Heart attack Brother    Social History   Socioeconomic History  . Marital status: Married    Spouse name: dale   . Number of children: 1  . Years of education: 66  . Highest education level: Associate degree: occupational, Hotel manager, or vocational program  Occupational History  . 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 Smoking status: Current Every Day Smoker    Packs/day: 1.00    Years: 40.00    Pack years: 40.00    Types: Cigarettes    Start date: 09/29/1980  . Smokeless tobacco: Former Network engineer and Sexual Activity  . Alcohol use: Not Currently  . Drug use: No  . Sexual activity: Yes  Other Topics Concern  . Not on file  Social History Narrative   Retired from a nursing home.  Lives with second wife.    Social Determinants of Health   Financial Resource Strain: Low Risk   . Difficulty of Paying Living Expenses: Not hard at all  Food Insecurity: No Food Insecurity  . Worried About Charity fundraiser in the Last Year: Never true  . Ran Out of Food in the Last Year: Never true  Transportation Needs: No Transportation Needs  . Lack of Transportation (Medical): No  . Lack of  Transportation (Non-Medical): No  Physical Activity: Insufficiently Active  . Days of Exercise per Week: 2 days  . Minutes of Exercise per Session: 20 min  Stress: Stress Concern Present  . Feeling of Stress : To some extent  Social Connections: Not Isolated  . Frequency of Communication with Friends and Family: More than three times a week  . Frequency of Social Gatherings with Friends and Family: More than three times a week  . Attends Religious Services: More than 4 times per year  . Active Member of Clubs or Organizations: Yes  . Attends Archivist Meetings: More than 4 times per year  . Marital Status: Married    Activities of Daily Living In your present state of health, do you have any difficulty  performing the following activities: 02/23/2020  Hearing? N  Vision? N  Difficulty concentrating or making decisions? Y  Walking or climbing stairs? Y  Dressing or bathing? N  Doing errands, shopping? N  Preparing Food and eating ? N  Using the Toilet? N  In the past six months, have you accidently leaked urine? N  Do you have problems with loss of bowel control? N  Managing your Medications? N  Managing your Finances? N  Housekeeping or managing your Housekeeping? N  Some recent data might be hidden    Patient Education/ Literacy How often do you need to have someone help you when you read instructions, pamphlets, or other written materials from your doctor or pharmacy?: 1 - Never What is the last grade level you completed in school?: Associates Degree  Exercise Current Exercise Habits: Home exercise routine, Type of exercise: walking, Time (Minutes): 25, Frequency (Times/Week): 2, Weekly Exercise (Minutes/Week): 50, Intensity: Mild, Exercise limited by: None identified  Diet Patient reports consuming 3 meals a day and 1 snack(s) a day Patient reports that his primary diet is: Regular Patient reports that she does have regular access to food.   Depression  Screen PHQ 2/9 Scores 02/23/2020 01/05/2020 10/19/2019 07/20/2019 05/18/2019 05/04/2019 02/16/2019  PHQ - 2 Score 4 1 2 2 1 1 1   PHQ- 9 Score 18 3 6 4  - - -     Anxiety Screen GAD 7 : Generalized Anxiety Score 02/23/2020 01/05/2020 10/19/2019 07/20/2019  Nervous, Anxious, on Edge 3 2 1 1   Control/stop worrying 3 1 1 1   Worry too much - different things 1 1 1  0  Trouble relaxing 3 2 2 1   Restless 3 1 1 1   Easily annoyed or irritable 0 1 1 1   Afraid - awful might happen 0 0 1 0  Total GAD 7 Score 13 8 8 5   Anxiety Difficulty Somewhat difficult Not difficult at all Somewhat difficult Not difficult at all    Fall Risk Fall Risk  02/23/2020 01/05/2020 10/19/2019 07/20/2019 05/18/2019  Falls in the past year? 0 0 0 0 0  Number falls in past yr: 0 - - - -  Injury with Fall? 0 - - - -  Risk for fall due to : No Fall Risks - - - -  Follow up Falls evaluation completed - - - -     Objective:  Joshua Bowman seemed alert and oriented and he participated appropriately during our telephone visit.  Blood Pressure Weight BMI  BP Readings from Last 3 Encounters:  02/22/20 129/74  01/05/20 121/83  10/19/19 (!) 146/89   Wt Readings from Last 3 Encounters:  02/23/20 168 lb (76.2 kg)  01/05/20 168 lb 3.2 oz (76.3 kg)  10/19/19 171 lb (77.6 kg)   BMI Readings from Last 1 Encounters:  02/23/20 27.12 kg/m    *Unable to obtain current vital signs, weight, and BMI due to telephone visit type  Hearing/Vision  . Joshua Bowman did not seem to have difficulty with hearing/understanding during the telephone conversation . Reports that he has not had a formal eye exam by an eye care professional within the past year . Reports that he has not had a formal hearing evaluation within the past year *Unable to fully assess hearing and vision during telephone visit type  Cognitive Function: 6CIT Screen 02/23/2020 02/16/2019  What Year? 0 points 0 points  What month? 0 points 0 points  What time? 0 points 0 points  Count  back from  20 0 points 0 points  Months in reverse 0 points 0 points  Repeat phrase 0 points 0 points  Total Score 0 0   (Normal:0-7, Significant for Dysfunction: >8)  Normal Cognitive Function Screening: Yes   Immunization & Health Maintenance Record Immunization History  Administered Date(s) Administered  . Fluad Quad(high Dose 65+) 07/20/2019  . Influenza Whole 06/20/2015  . Influenza, High Dose Seasonal PF 06/20/2016, 07/22/2018  . Influenza,inj,Quad PF,6+ Mos 08/09/2014, 08/04/2017  . Pneumococcal Conjugate-13 12/06/2013  . Pneumococcal Polysaccharide-23 11/19/2011  . Td 12/03/2017    Health Maintenance  Topic Date Due  . COVID-19 Vaccine (1) Never done  . COLONOSCOPY  11/19/2018  . INFLUENZA VACCINE  04/29/2020  . TETANUS/TDAP  12/04/2027  . Hepatitis C Screening  Completed  . PNA vac Low Risk Adult  Completed       Assessment  This is a routine wellness examination for Joshua Bowman.  Health Maintenance: Due or Overdue Health Maintenance Due  Topic Date Due  . COVID-19 Vaccine (1) Never done  . COLONOSCOPY  11/19/2018    Joshua Bowman does not need a referral for Community Assistance: Care Management:   no Social Work:    no Prescription Assistance:  no Nutrition/Diabetes Education:  no   Plan:  Personalized Goals Goals Addressed            This Visit's Progress   . Exercise 3x per week (30 min per time)       02/23/2020 AWV Goal: Exercise for General Health   Patient will verbalize understanding of the benefits of increased physical activity:  Exercising regularly is important. It will improve your overall fitness, flexibility, and endurance.  Regular exercise also will improve your overall health. It can help you control your weight, reduce stress, and improve your bone density.  Over the next year, patient will increase physical activity as tolerated with a goal of at least 150 minutes of moderate physical activity per week.   You can  tell that you are exercising at a moderate intensity if your heart starts beating faster and you start breathing faster but can still hold a conversation.  Moderate-intensity exercise ideas include:  Walking 1 mile (1.6 km) in about 15 minutes  Biking  Hiking  Golfing  Dancing  Water aerobics  Patient will verbalize understanding of everyday activities that increase physical activity by providing examples like the following: ? Yard work, such as: ? Pushing a Conservation officer, nature ? Raking and bagging leaves ? Washing your car ? Pushing a stroller ? Shoveling snow ? Gardening ? Washing windows or floors  Patient will be able to explain general safety guidelines for exercising:   Before you start a new exercise program, talk with your health care provider.  Do not exercise so much that you hurt yourself, feel dizzy, or get very short of breath.  Wear comfortable clothes and wear shoes with good support.  Drink plenty of water while you exercise to prevent dehydration or heat stroke.  Work out until your breathing and your heartbeat get faster.     . Quit Smoking       02/23/2020 AWV Goal: Tobacco Cessation  Smoking cessation instruction/counseling given:  counseled patient on the dangers of tobacco use, advised patient to stop smoking, and reviewed strategies to maximize success   Patient will verbalize understanding of the health risks associated with smoking/tobacco use  Lung cancer or lung disease, such as COPD  Heart disease.  Stroke.  Heart  attack  Infertility  Osteoporosis and bone fractures. . Patient will create a plan to quit smoking/using tobacco  Pick a date to quit.   Write down the reasons why you are quitting and put it where you will see it often.  Identify the people, places, things, and activities that make you want to smoke (triggers) and avoid them. Make sure to take these actions: ? Throw away all cigarettes at home, at work, and in your  car. ? Throw away smoking accessories, such as Scientist, research (medical). ? Clean your car and make sure to empty the ashtray. ? Clean your home, including curtains and carpets.  Tell your family, friends, and coworkers that you are quitting. Support from your loved ones can make quitting easier.  Talk with your health care provider about your options for quitting smoking.  Find out what treatment options are covered by your health insurance. . Patient will be able to demonstrate knowledge of tobacco cessation strategies that may maximize success  Quitting "cold [place]" is more successful than gradually quitting.  Attending in-person counseling to help you build problem-solving skills.   Finding resources and support systems that can help you to quit smoking and remain smoke-free after you quit. These resources are most helpful when you use them often. They can include: ? Online chats with a Social worker. ? Telephone quitlines. ? Careers information officer. ? Support groups or group counseling. ? Text messaging programs. ? Mobile phone applications.  Taking medicines to help you quit smoking: ? Nicotine patches, gum, or lozenges. ? Nicotine inhalers or sprays. ? Non-nicotine medicine that is taken by mouth. . Patient will note get discouraged if the process is difficult . Over the next year, patient will stop smoking or using other forms of tobacco  Smoking cessation instruction/counseling given:  counseled patient on the dangers of tobacco use, advised patient to stop smoking, and reviewed strategies to maximize success        Personalized Health Maintenance & Screening Recommendations  Colorectal cancer screening Smoking cessation counseling Advanced directives: has NO advanced directive  - add't info requested. Referral to SW: no  Lung Cancer Screening Recommended: yes (Low Dose CT Chest recommended if Age 36-80 years, 30 pack-year currently smoking OR have quit w/in past 15  years) Hepatitis C Screening recommended: no HIV Screening recommended: no  Advanced Directives: Written information was prepared per patient's request.  Referrals & Orders Orders Placed This Encounter  Procedures  . Ambulatory referral to Gastroenterology    Follow-up Plan . Follow-up with Claretta Fraise, MD as planned . Discuss depression and anxiety at office visit. . Yearly Colonoscopy due.  Referral placed.   . Patient to look over Advance Directive Packet and discuss with Family   I have personally reviewed and noted the following in the patient's chart:   . Medical and social history . Use of alcohol, tobacco or illicit drugs  . Current medications and supplements . Functional ability and status . Nutritional status . Physical activity . Advanced directives . List of other physicians . Hospitalizations, surgeries, and ER visits in previous 12 months . Vitals . Screenings to include cognitive, depression, and falls . Referrals and appointments  In addition, I have reviewed and discussed with Joshua Bowman certain preventive protocols, quality metrics, and best practice recommendations. A written personalized care plan for preventive services as well as general preventive health recommendations is available and can be mailed to the patient at his request.      Wilfred Curtis  Hessie Diener, LPN  [id number]   AVS printed and mailed to patient

## 2020-02-29 ENCOUNTER — Encounter: Payer: Self-pay | Admitting: Family Medicine

## 2020-02-29 ENCOUNTER — Other Ambulatory Visit: Payer: Self-pay

## 2020-02-29 ENCOUNTER — Ambulatory Visit (INDEPENDENT_AMBULATORY_CARE_PROVIDER_SITE_OTHER): Payer: Medicare Other | Admitting: Family Medicine

## 2020-02-29 VITALS — BP 133/88 | HR 98 | Temp 98.3°F | Ht 66.0 in | Wt 167.4 lb

## 2020-02-29 DIAGNOSIS — M8949 Other hypertrophic osteoarthropathy, multiple sites: Secondary | ICD-10-CM | POA: Diagnosis not present

## 2020-02-29 DIAGNOSIS — Z79899 Other long term (current) drug therapy: Secondary | ICD-10-CM | POA: Diagnosis not present

## 2020-02-29 DIAGNOSIS — G8929 Other chronic pain: Secondary | ICD-10-CM | POA: Diagnosis not present

## 2020-02-29 DIAGNOSIS — M159 Polyosteoarthritis, unspecified: Secondary | ICD-10-CM

## 2020-02-29 DIAGNOSIS — M25511 Pain in right shoulder: Secondary | ICD-10-CM | POA: Diagnosis not present

## 2020-02-29 DIAGNOSIS — F411 Generalized anxiety disorder: Secondary | ICD-10-CM | POA: Diagnosis not present

## 2020-02-29 MED ORDER — BUSPIRONE HCL 5 MG PO TABS: 5.0000 mg | ORAL_TABLET | Freq: Three times a day (TID) | ORAL | 0 refills | Status: DC

## 2020-02-29 MED ORDER — DULOXETINE HCL 30 MG PO CPEP: 30.0000 mg | ORAL_CAPSULE | Freq: Every day | ORAL | 0 refills | Status: DC

## 2020-02-29 MED ORDER — HYDROCODONE-ACETAMINOPHEN 5-325 MG PO TABS: 0.5000 | ORAL_TABLET | Freq: Four times a day (QID) | ORAL | 0 refills | Status: DC | PRN

## 2020-02-29 MED ORDER — HYDROCODONE-ACETAMINOPHEN 5-325 MG PO TABS: 0.5000 | ORAL_TABLET | Freq: Three times a day (TID) | ORAL | 0 refills | Status: DC | PRN

## 2020-02-29 MED ORDER — BUSPIRONE HCL 5 MG PO TABS: 7.5000 mg | ORAL_TABLET | Freq: Three times a day (TID) | ORAL | 0 refills | Status: DC

## 2020-02-29 NOTE — Progress Notes (Signed)
Subjective:  Patient ID: Joshua Bowman, male    DOB: Feb 14, 1946  Age: 74 y.o. MRN: [id number]  CC: Anxiety and Depression   HPI  Joshua Bowman presents for patient has been followed over time for his problems with anxiety and depression.  For this he has been taking BuSpar 7-1/2 mg 3 times daily.  His GAD-7 and PHQ-9 scores are noted below.  Of specific interest is that he rates his anxiety as making it very difficult to get along with other people, take care of things at home and at work.  He scores in the severe anxiety range.  Patient says that he has had a drinking problem in the past.  He was recently out of his medication and went back to drinking.  Last week he took himself off of alcohol.  He had been drinking 6-12 beers daily.  He states that he is fidgety.  He gets no relief from any activity.  BuSpar has quit helping.  He had been weaned off of lorazepam. GAD 7 : Generalized Anxiety Score 02/29/2020 02/23/2020 01/05/2020 10/19/2019  Nervous, Anxious, on Edge 3 3 2 1   Control/stop worrying 2 3 1 1   Worry too much - different things 2 1 1 1   Trouble relaxing 3 3 2 2   Restless 3 3 1 1   Easily annoyed or irritable 1 0 1 1  Afraid - awful might happen 1 0 0 1  Total GAD 7 Score 15 13 8 8   Anxiety Difficulty Somewhat difficult Somewhat difficult Not difficult at all Somewhat difficult      Depression screen [facility] 2/9 02/29/2020 02/23/2020 01/05/2020  Decreased Interest 2 1 1   Down, Depressed, Hopeless 1 3 0  PHQ - 2 Score 3 4 1   Altered sleeping 2 3 1   Tired, decreased energy 1 3 1   Change in appetite 0 1 0  Feeling bad or failure about yourself  1 3 0  Trouble concentrating 1 1 0  Moving slowly or fidgety/restless 1 3 0  Suicidal thoughts 0 0 0  PHQ-9 Score 9 18 3   Difficult doing work/chores Somewhat difficult Very difficult Not difficult at all  Some recent data might be hidden   GAD 7 : Generalized Anxiety Score 02/29/2020 02/23/2020 01/05/2020 10/19/2019  Nervous, Anxious, on Edge  3 3 2 1   Control/stop worrying 2 3 1 1   Worry too much - different things 2 1 1 1   Trouble relaxing 3 3 2 2   Restless 3 3 1 1   Easily annoyed or irritable 1 0 1 1  Afraid - awful might happen 1 0 0 1  Total GAD 7 Score 15 13 8 8   Anxiety Difficulty Somewhat difficult Somewhat difficult Not difficult at all Somewhat difficult     History Joshua Bowman has a past medical history of Anxiety, Arthritis, Bilateral carotid artery stenosis, Colon polyps - attenuated polyposis syndrome, COPD (chronic obstructive pulmonary disease) ([place]), Duodenitis, Gastritis, Hemorrhoids, Hyperlipidemia, Hyperplasia of prostate, IBS (irritable bowel syndrome), Pre-diabetes, and Sigmoid diverticulosis.   He has a past surgical history that includes Appendectomy (1969); nasal surgery ( spurs ) (70's); lipoma rt shoulder (01/2001); rt. shoulder -rotator cuff repair (08/2005); Colonoscopy; Esophagogastroduodenoscopy; Tonsillectomy; and Eye surgery.   His family history includes Arthritis in his sister; Congestive Heart Failure in his father; Heart attack in his brother; Heart disease in his brother and mother; Post-traumatic stress disorder in his father; Prostate cancer in his brother.He reports that he has been smoking cigarettes. He started smoking  about 39 years ago. He has a 40.00 pack-year smoking history. He has quit using smokeless tobacco. He reports previous alcohol use. He reports that he does not use drugs.    ROS Review of Systems  Constitutional: Negative for fever.  Respiratory: Negative for shortness of breath.   Cardiovascular: Negative for chest pain.  Musculoskeletal: Negative for arthralgias.  Skin: Negative for rash.    Objective:  BP 133/88   Pulse 98   Temp 98.3 F (36.8 C) (Temporal)   Ht 5\' 6"  (1.676 m)   Wt 167 lb 6.4 oz (75.9 kg)   BMI 27.02 kg/m   BP Readings from Last 3 Encounters:  02/29/20 133/88  02/22/20 129/74  01/05/20 121/83    Wt Readings from Last 3 Encounters:    02/29/20 167 lb 6.4 oz (75.9 kg)  02/23/20 168 lb (76.2 kg)  01/05/20 168 lb 3.2 oz (76.3 kg)     Physical Exam Vitals reviewed.  Constitutional:      Appearance: He is well-developed.  HENT:     Head: Normocephalic and atraumatic.     Right Ear: Tympanic membrane and external ear normal. No decreased hearing noted.     Left Ear: Tympanic membrane and external ear normal. No decreased hearing noted.     Mouth/Throat:     Pharynx: No oropharyngeal exudate or posterior oropharyngeal erythema.  Eyes:     Pupils: Pupils are equal, round, and reactive to light.  Cardiovascular:     Rate and Rhythm: Normal rate and regular rhythm.     Heart sounds: No murmur.  Pulmonary:     Effort: No respiratory distress.     Breath sounds: Normal breath sounds.  Abdominal:     General: Bowel sounds are normal.     Palpations: Abdomen is soft. There is no mass.     Tenderness: There is no abdominal tenderness.  Musculoskeletal:     Cervical back: Normal range of motion and neck supple.       Assessment & Plan:   Joshua Bowman was seen today for anxiety and depression.  Diagnoses and all orders for this visit:  Controlled substance agreement signed -     ToxASSURE Select 13 (MW), Urine  Chronic pain in right shoulder -     Discontinue: HYDROcodone-acetaminophen (NORCO/VICODIN) 5-325 MG tablet; Take 0.5 tablets by mouth every 6 (six) hours as needed for moderate pain. -     HYDROcodone-acetaminophen (NORCO/VICODIN) 5-325 MG tablet; Take 0.5 tablets by mouth 3 (three) times daily as needed for moderate pain.  Primary osteoarthritis involving multiple joints -     Discontinue: HYDROcodone-acetaminophen (NORCO/VICODIN) 5-325 MG tablet; Take 0.5 tablets by mouth every 6 (six) hours as needed for moderate pain. -     HYDROcodone-acetaminophen (NORCO/VICODIN) 5-325 MG tablet; Take 0.5 tablets by mouth 3 (three) times daily as needed for moderate pain.  GAD (generalized anxiety disorder) -      Discontinue: busPIRone (BUSPAR) 5 MG tablet; Take 1.5 tablets (7.5 mg total) by mouth 3 (three) times daily. -     busPIRone (BUSPAR) 5 MG tablet; Take 1 tablet (5 mg total) by mouth 3 (three) times daily.  Other orders -     DULoxetine (CYMBALTA) 30 MG capsule; Take 1 capsule (30 mg total) by mouth daily. For one week then two daily. Take with a full stomach at suppertime       I have discontinued Joshua Bowman's HYDROcodone-acetaminophen, HYDROcodone-acetaminophen, and busPIRone. I have also changed his busPIRone and HYDROcodone-acetaminophen.  Additionally, I am having him start on DULoxetine. Lastly, I am having him maintain his aspirin, Vitamin D3, diclofenac sodium, budesonide-formoterol, losartan, and finasteride.  Allergies as of 02/29/2020      Reactions   Lipitor [atorvastatin] Other (See Comments)   myalgias   Sulfa Antibiotics Itching, Rash      Medication List       Accurate as of February 29, 2020 11:59 PM. If you have any questions, ask your nurse or doctor.        aspirin 325 MG tablet Take 325 mg by mouth every other day.   budesonide-formoterol 160-4.5 MCG/ACT inhaler Commonly known as: Symbicort INHALE 2 PUFFS BY MOUTH INTO THE LUNGS TWICE A DAY (RINSE MOUTH AFTER EACH USE)   busPIRone 5 MG tablet Commonly known as: BUSPAR Take 1 tablet (5 mg total) by mouth 3 (three) times daily. What changed:   medication strength  how much to take Changed by: Claretta Fraise, MD   diclofenac sodium 1 % Gel Commonly known as: Voltaren Apply 4 g topically 2 (two) times daily.   DULoxetine 30 MG capsule Commonly known as: Cymbalta Take 1 capsule (30 mg total) by mouth daily. For one week then two daily. Take with a full stomach at suppertime Started by: Claretta Fraise, MD   finasteride 5 MG tablet Commonly known as: PROSCAR TAKE 1 TABLET BY MOUTH EVERYDAY AT BEDTIME   HYDROcodone-acetaminophen 5-325 MG tablet Commonly known as: NORCO/VICODIN Take 0.5 tablets by  mouth 3 (three) times daily as needed for moderate pain. What changed:   how much to take  when to take this  Another medication with the same name was removed. Continue taking this medication, and follow the directions you see here. Changed by: Claretta Fraise, MD   losartan 50 MG tablet Commonly known as: COZAAR Take 1 tablet (50 mg total) by mouth daily.   Vitamin D3 125 MCG (5000 UT) Caps Take 1 tablet by mouth. 2-3 x week        Follow-up: Return in about 1 month (around 03/30/2020).  Claretta Fraise, M.D.

## 2020-03-01 ENCOUNTER — Other Ambulatory Visit: Payer: Self-pay | Admitting: *Deleted

## 2020-03-01 MED ORDER — ALBUTEROL SULFATE HFA 108 (90 BASE) MCG/ACT IN AERS
2.0000 | INHALATION_SPRAY | RESPIRATORY_TRACT | 4 refills | Status: DC | PRN
Start: 2020-03-01 — End: 2022-06-10

## 2020-03-03 LAB — TOXASSURE SELECT 13 (MW), URINE

## 2020-03-04 ENCOUNTER — Encounter: Payer: Self-pay | Admitting: Family Medicine

## 2020-03-22 ENCOUNTER — Other Ambulatory Visit: Payer: Self-pay | Admitting: Family Medicine

## 2020-03-23 ENCOUNTER — Telehealth: Payer: Self-pay | Admitting: *Deleted

## 2020-03-23 DIAGNOSIS — G8929 Other chronic pain: Secondary | ICD-10-CM

## 2020-03-23 DIAGNOSIS — M159 Polyosteoarthritis, unspecified: Secondary | ICD-10-CM

## 2020-03-23 MED ORDER — HYDROCODONE-ACETAMINOPHEN 5-325 MG PO TABS: 0.5000 | ORAL_TABLET | Freq: Three times a day (TID) | ORAL | 0 refills | Status: DC | PRN

## 2020-03-23 NOTE — Telephone Encounter (Signed)
Patient called stating he has spoke with pharmacy about hydrocodone Rx.  Dr. Livia Snellen only sent Rx with quantity of 1 tablet.  Hydrocodone every 6 hours was discontinued and changed to 3 times daily.   Patient does not have Rx at pharmacy and needs a refill.

## 2020-04-06 ENCOUNTER — Ambulatory Visit (INDEPENDENT_AMBULATORY_CARE_PROVIDER_SITE_OTHER): Payer: Medicare Other | Admitting: Family Medicine

## 2020-04-06 ENCOUNTER — Encounter: Payer: Self-pay | Admitting: Family Medicine

## 2020-04-06 ENCOUNTER — Other Ambulatory Visit: Payer: Self-pay

## 2020-04-06 DIAGNOSIS — M25511 Pain in right shoulder: Secondary | ICD-10-CM

## 2020-04-06 DIAGNOSIS — F411 Generalized anxiety disorder: Secondary | ICD-10-CM | POA: Diagnosis not present

## 2020-04-06 DIAGNOSIS — M8949 Other hypertrophic osteoarthropathy, multiple sites: Secondary | ICD-10-CM | POA: Diagnosis not present

## 2020-04-06 DIAGNOSIS — G8929 Other chronic pain: Secondary | ICD-10-CM

## 2020-04-06 DIAGNOSIS — M159 Polyosteoarthritis, unspecified: Secondary | ICD-10-CM

## 2020-04-06 MED ORDER — BUSPIRONE HCL 5 MG PO TABS: ORAL_TABLET | ORAL | 0 refills | Status: DC

## 2020-04-06 MED ORDER — HYDROCODONE-ACETAMINOPHEN 5-325 MG PO TABS: 0.5000 | ORAL_TABLET | Freq: Four times a day (QID) | ORAL | 0 refills | Status: AC | PRN

## 2020-04-06 MED ORDER — HYDROCODONE-ACETAMINOPHEN 5-325 MG PO TABS: 0.5000 | ORAL_TABLET | Freq: Four times a day (QID) | ORAL | 0 refills | Status: DC | PRN

## 2020-04-06 MED ORDER — DULOXETINE HCL 60 MG PO CPEP: 60.0000 mg | ORAL_CAPSULE | Freq: Two times a day (BID) | ORAL | 1 refills | Status: DC

## 2020-04-06 NOTE — Patient Instructions (Signed)
Take Cymbalta as follows: Take 30 mg each morning and 60 mg each evening for 1 week. Then increase to 60 mg each morning and 60 mg each evening. You may use to 30 mg tablets for each dose or a 160 mg tablet for each dose at your convenience.  He will taper off of the BuSpar by going to 1 tablet twice daily for a week then 1 at bedtime for a week then one half at bedtime for a week.

## 2020-04-06 NOTE — Progress Notes (Signed)
Subjective:  Patient ID: Joshua Bowman, male    DOB: 07/01/1946  Age: 74 y.o. MRN: [id number]  CC: Medical Management of Chronic Issues   HPI Pace Lamadrid Broadwell presents for Recheck of his chronic anxiety.  He has been weaning the BuSpar.  He is now on 5 mg 3 times a day.  He tells me that the Cymbalta started kicking in about 10 days ago.  He gets fidgety still 3-4 times a week but overall his anxiety is much decreased.  He continues with abstinence from alcohol now up to 2 months since his last drink.  He continues to take hydrocodone half tablet 3 times a day but has to have an extra one half during the night due to back and shoulder pain fairly frequently.  He is completely off of lorazepam now.  PDMP score shows his overdose risk to be 070 out of 1000 is NARx score for narcotic is 371 for sedative it is 250 and stimulant it is 0.  Each of his prescriptions for hydrocodone has been appropriately spaced for the right number of pills going back at least 2 years.  His last prescription for lorazepam was filled August 19 of 2020 his morphine milligram equivalent is 15.  Depression screen [facility] 2/9 02/29/2020 02/23/2020 01/05/2020  Decreased Interest 2 1 1   Down, Depressed, Hopeless 1 3 0  PHQ - 2 Score 3 4 1   Altered sleeping 2 3 1   Tired, decreased energy 1 3 1   Change in appetite 0 1 0  Feeling bad or failure about yourself  1 3 0  Trouble concentrating 1 1 0  Moving slowly or fidgety/restless 1 3 0  Suicidal thoughts 0 0 0  PHQ-9 Score 9 18 3   Difficult doing work/chores Somewhat difficult Very difficult Not difficult at all  Some recent data might be hidden    History Demetry has a past medical history of Anxiety, Arthritis, Bilateral carotid artery stenosis, Colon polyps - attenuated polyposis syndrome, COPD (chronic obstructive pulmonary disease) ([place]), Duodenitis, Gastritis, Hemorrhoids, Hyperlipidemia, Hyperplasia of prostate, IBS (irritable bowel syndrome), Pre-diabetes, and Sigmoid  diverticulosis.   He has a past surgical history that includes Appendectomy (1969); nasal surgery ( spurs ) (70's); lipoma rt shoulder (01/2001); rt. shoulder -rotator cuff repair (08/2005); Colonoscopy; Esophagogastroduodenoscopy; Tonsillectomy; and Eye surgery.   His family history includes Arthritis in his sister; Congestive Heart Failure in his father; Heart attack in his brother; Heart disease in his brother and mother; Post-traumatic stress disorder in his father; Prostate cancer in his brother.He reports that he has been smoking cigarettes. He started smoking about 39 years ago. He has a 40.00 pack-year smoking history. He has quit using smokeless tobacco. He reports previous alcohol use. He reports that he does not use drugs.    ROS Review of Systems  Constitutional: Negative for fever.  Respiratory: Negative for shortness of breath.   Cardiovascular: Negative for chest pain.  Musculoskeletal: Positive for arthralgias and back pain.  Skin: Negative for rash.    Objective:  BP (!) 162/91    Pulse (!) 101    Temp 98.1 F (36.7 C) (Temporal)    Resp 20    Ht 5\' 6"  (1.676 m)    Wt 168 lb 2 oz (76.3 kg)    SpO2 97%    BMI 27.14 kg/m   BP Readings from Last 3 Encounters:  04/06/20 (!) 162/91  02/29/20 133/88  02/22/20 129/74    Wt Readings from Last 3 Encounters:  04/06/20 168 lb 2 oz (76.3 kg)  02/29/20 167 lb 6.4 oz (75.9 kg)  02/23/20 168 lb (76.2 kg)     Physical Exam Vitals reviewed.  Constitutional:      Appearance: He is well-developed.  HENT:     Head: Normocephalic and atraumatic.     Right Ear: External ear normal.     Left Ear: External ear normal.     Mouth/Throat:     Pharynx: No oropharyngeal exudate or posterior oropharyngeal erythema.  Eyes:     Pupils: Pupils are equal, round, and reactive to light.  Cardiovascular:     Rate and Rhythm: Normal rate and regular rhythm.     Heart sounds: No murmur heard.   Pulmonary:     Effort: No respiratory  distress.     Breath sounds: Normal breath sounds.  Musculoskeletal:     Cervical back: Normal range of motion and neck supple.  Neurological:     Mental Status: He is alert and oriented to person, place, and time.       Assessment & Plan:   Kamal was seen today for medical management of chronic issues.  Diagnoses and all orders for this visit:  GAD (generalized anxiety disorder) -     busPIRone (BUSPAR) 5 MG tablet; Take 1 tablet twice daily for 1 week. Then decrease to 1 tablet at bedtime for 1 week. Then go to 1/2 tablet at bedtime for 1 week. Then discontinue this medication  Chronic pain in right shoulder -     HYDROcodone-acetaminophen (NORCO/VICODIN) 5-325 MG tablet; Take 0.5 tablets by mouth every 6 (six) hours as needed for moderate pain. -     HYDROcodone-acetaminophen (NORCO/VICODIN) 5-325 MG tablet; Take 0.5 tablets by mouth every 6 (six) hours as needed for moderate pain.  Primary osteoarthritis involving multiple joints -     HYDROcodone-acetaminophen (NORCO/VICODIN) 5-325 MG tablet; Take 0.5 tablets by mouth every 6 (six) hours as needed for moderate pain. -     HYDROcodone-acetaminophen (NORCO/VICODIN) 5-325 MG tablet; Take 0.5 tablets by mouth every 6 (six) hours as needed for moderate pain.  Other orders -     DULoxetine (CYMBALTA) 60 MG capsule; Take 1 capsule (60 mg total) by mouth 2 (two) times daily.       I have changed Renell W. Gonyer's busPIRone, HYDROcodone-acetaminophen, and DULoxetine. I am also having him start on HYDROcodone-acetaminophen. Additionally, I am having him maintain his aspirin, Vitamin D3, diclofenac sodium, budesonide-formoterol, losartan, finasteride, and albuterol.  Allergies as of 04/06/2020      Reactions   Lipitor [atorvastatin] Other (See Comments)   myalgias   Sulfa Antibiotics Itching, Rash      Medication List       Accurate as of April 06, 2020 11:59 PM. If you have any questions, ask your nurse or doctor.          albuterol 108 (90 Base) MCG/ACT inhaler Commonly known as: VENTOLIN HFA Inhale 2 puffs into the lungs every 4 (four) hours as needed for wheezing or shortness of breath.   aspirin 325 MG tablet Take 325 mg by mouth every other day.   budesonide-formoterol 160-4.5 MCG/ACT inhaler Commonly known as: Symbicort INHALE 2 PUFFS BY MOUTH INTO THE LUNGS TWICE A DAY (RINSE MOUTH AFTER EACH USE)   busPIRone 5 MG tablet Commonly known as: BUSPAR Take 1 tablet twice daily for 1 week. Then decrease to 1 tablet at bedtime for 1 week. Then go to 1/2 tablet at bedtime for 1  week. Then discontinue this medication What changed:   how much to take  how to take this  when to take this  additional instructions Changed by: Claretta Fraise, MD   diclofenac sodium 1 % Gel Commonly known as: Voltaren Apply 4 g topically 2 (two) times daily.   DULoxetine 60 MG capsule Commonly known as: CYMBALTA Take 1 capsule (60 mg total) by mouth 2 (two) times daily. What changed:   medication strength  See the new instructions. Changed by: Claretta Fraise, MD   finasteride 5 MG tablet Commonly known as: PROSCAR TAKE 1 TABLET BY MOUTH EVERYDAY AT BEDTIME   HYDROcodone-acetaminophen 5-325 MG tablet Commonly known as: NORCO/VICODIN Take 0.5 tablets by mouth every 6 (six) hours as needed for moderate pain. Start taking on: April 13, 2020 What changed: You were already taking a medication with the same name, and this prescription was added. Make sure you understand how and when to take each. Changed by: Claretta Fraise, MD   HYDROcodone-acetaminophen 5-325 MG tablet Commonly known as: NORCO/VICODIN Take 0.5 tablets by mouth every 6 (six) hours as needed for moderate pain. Start taking on: May 13, 2020 What changed:   when to take this  These instructions start on May 13, 2020. If you are unsure what to do until then, ask your doctor or other care provider. Changed by: Claretta Fraise, MD   losartan  50 MG tablet Commonly known as: COZAAR Take 1 tablet (50 mg total) by mouth daily.   Vitamin D3 125 MCG (5000 UT) Caps Take 1 tablet by mouth. 2-3 x week      Today's prescriptions reduce his morphine milligram equivalent to 10.  Follow-up: Return in about 2 months (around 06/07/2020).  Claretta Fraise, M.D.

## 2020-04-07 ENCOUNTER — Encounter: Payer: Self-pay | Admitting: Family Medicine

## 2020-04-09 ENCOUNTER — Other Ambulatory Visit: Payer: Self-pay | Admitting: Family Medicine

## 2020-04-09 DIAGNOSIS — F411 Generalized anxiety disorder: Secondary | ICD-10-CM

## 2020-04-13 ENCOUNTER — Telehealth: Payer: Self-pay | Admitting: Family Medicine

## 2020-04-13 NOTE — Telephone Encounter (Signed)
I called [facility] and they said he wasn't due to pick up his pain meds till 04/22/20. Advised pharmacy that Dr Livia Snellen had increased the amount of times therefore increasing his dose so the pharmacy was able to get the override and pt is aware can pick up medication.

## 2020-05-08 ENCOUNTER — Telehealth: Payer: Self-pay | Admitting: Family Medicine

## 2020-05-08 ENCOUNTER — Other Ambulatory Visit: Payer: Self-pay | Admitting: Family Medicine

## 2020-05-08 MED ORDER — DULOXETINE HCL 60 MG PO CPEP: 60.0000 mg | ORAL_CAPSULE | Freq: Every day | ORAL | 1 refills | Status: DC

## 2020-05-08 NOTE — Telephone Encounter (Signed)
I discussed with the patient.  He is having 2-3 loose bowel movements a day.  He is concerned about this coming from the increase in the Cymbalta.  He had been taking the Cymbalta at the twice daily dose for about a month before that started.  Therefore I want him to have a trial of going back to Cymbalta 60 mg once a day instead of twice a day.  If the diarrhea goes away and his anxiety/depression symptoms do not worsen then will leave things as is.  If on the other hand he gets more depressed will have to look at other options.

## 2020-05-16 ENCOUNTER — Telehealth: Payer: Self-pay | Admitting: Family Medicine

## 2020-06-08 ENCOUNTER — Other Ambulatory Visit: Payer: Self-pay | Admitting: Family Medicine

## 2020-06-08 DIAGNOSIS — J418 Mixed simple and mucopurulent chronic bronchitis: Secondary | ICD-10-CM

## 2020-06-12 ENCOUNTER — Other Ambulatory Visit: Payer: Self-pay

## 2020-06-12 ENCOUNTER — Encounter: Payer: Self-pay | Admitting: Family Medicine

## 2020-06-12 ENCOUNTER — Ambulatory Visit (INDEPENDENT_AMBULATORY_CARE_PROVIDER_SITE_OTHER): Payer: Medicare Other | Admitting: Family Medicine

## 2020-06-12 VITALS — BP 167/92 | HR 108 | Temp 98.4°F | Resp 20 | Ht 66.0 in | Wt 166.0 lb

## 2020-06-12 DIAGNOSIS — J418 Mixed simple and mucopurulent chronic bronchitis: Secondary | ICD-10-CM

## 2020-06-12 DIAGNOSIS — Z23 Encounter for immunization: Secondary | ICD-10-CM | POA: Diagnosis not present

## 2020-06-12 DIAGNOSIS — M25511 Pain in right shoulder: Secondary | ICD-10-CM

## 2020-06-12 DIAGNOSIS — I10 Essential (primary) hypertension: Secondary | ICD-10-CM

## 2020-06-12 DIAGNOSIS — M8949 Other hypertrophic osteoarthropathy, multiple sites: Secondary | ICD-10-CM

## 2020-06-12 DIAGNOSIS — N401 Enlarged prostate with lower urinary tract symptoms: Secondary | ICD-10-CM

## 2020-06-12 DIAGNOSIS — M159 Polyosteoarthritis, unspecified: Secondary | ICD-10-CM

## 2020-06-12 DIAGNOSIS — G8929 Other chronic pain: Secondary | ICD-10-CM

## 2020-06-12 DIAGNOSIS — N3943 Post-void dribbling: Secondary | ICD-10-CM

## 2020-06-12 MED ORDER — HYDROCODONE-ACETAMINOPHEN 5-325 MG PO TABS: 0.5000 | ORAL_TABLET | Freq: Four times a day (QID) | ORAL | 0 refills | Status: AC | PRN

## 2020-06-12 MED ORDER — COLESTIPOL HCL 5 G PO GRAN: 5.0000 g | GRANULES | Freq: Two times a day (BID) | ORAL | 12 refills | Status: DC

## 2020-06-12 MED ORDER — LOSARTAN POTASSIUM 50 MG PO TABS: 50.0000 mg | ORAL_TABLET | Freq: Every day | ORAL | 1 refills | Status: DC

## 2020-06-12 MED ORDER — DULOXETINE HCL 60 MG PO CPEP
60.0000 mg | ORAL_CAPSULE | Freq: Two times a day (BID) | ORAL | 1 refills | Status: DC
Start: 2020-06-12 — End: 2020-10-01

## 2020-06-12 MED ORDER — BREO ELLIPTA 200-25 MCG/INH IN AEPB: 1.0000 | INHALATION_SPRAY | Freq: Every day | RESPIRATORY_TRACT | 11 refills | Status: DC

## 2020-06-12 MED ORDER — HYDROCODONE-ACETAMINOPHEN 5-325 MG PO TABS: 0.5000 | ORAL_TABLET | Freq: Four times a day (QID) | ORAL | 0 refills | Status: DC | PRN

## 2020-06-12 MED ORDER — FINASTERIDE 5 MG PO TABS: ORAL_TABLET | ORAL | 1 refills | Status: DC

## 2020-06-12 NOTE — Progress Notes (Signed)
Subjective:  Patient ID: Joshua Bowman, male    DOB: 08/31/1946  Age: 74 y.o. MRN: [id number]  CC: Medical Management of Chronic Issues   HPI Joshua Bowman presents for recheck for his ongoing problems with anxiety and depression and diarrhea as well as his blood pressure.  He was able to discontinue the BuSpar.  He is down to 1 loose bowel movement a day most days but will have a second 1 a couple of times a week.  He has had no soiling or fecal incontinence.  He was able to increase the Cymbalta to the desired dose of 60 mg twice daily.  He says he is feeling much better than he has in quite some time.  He says he was feeling just awful when he came in for his first visit 6 months ago.  His home blood pressure readings were brought in he is checking about once a week and most of the readings are showing a systolic in the 782N to 562 range.  His diastolics are running in the mid 70s.  Patient tells me that he has 3 or 4 of his hydrocodone left.  He is scheduled to run out tomorrow.  He is taking a half of the hydrocodone 3 times a day every day and sometimes he has to take an extra half during the night when he wakes up in bed of intense pain.  Based on his having 3 to 4 pills left, that implies that he has only had 6-8 nights when he did not have to take an extra half pill.  PDMP review performed today shows no discrepancies again is prescription filling record for controlled substances.  Depression screen [facility] 2/9 06/12/2020 02/29/2020 02/23/2020  Decreased Interest 0 2 1  Down, Depressed, Hopeless 0 1 3  PHQ - 2 Score 0 3 4  Altered sleeping - 2 3  Tired, decreased energy - 1 3  Change in appetite - 0 1  Feeling bad or failure about yourself  - 1 3  Trouble concentrating - 1 1  Moving slowly or fidgety/restless - 1 3  Suicidal thoughts - 0 0  PHQ-9 Score - 9 18  Difficult doing work/chores - Somewhat difficult Very difficult  Some recent data might be hidden    History [name] has a  past medical history of Anxiety, Arthritis, Bilateral carotid artery stenosis, Colon polyps - attenuated polyposis syndrome, COPD (chronic obstructive pulmonary disease) ([place]), Duodenitis, Gastritis, Hemorrhoids, Hyperlipidemia, Hyperplasia of prostate, IBS (irritable bowel syndrome), Pre-diabetes, and Sigmoid diverticulosis.   He has a past surgical history that includes Appendectomy (1969); nasal surgery ( spurs ) (70's); lipoma rt shoulder (01/2001); rt. shoulder -rotator cuff repair (08/2005); Colonoscopy; Esophagogastroduodenoscopy; Tonsillectomy; and Eye surgery.   His family history includes Arthritis in his sister; Congestive Heart Failure in his father; Heart attack in his brother; Heart disease in his brother and mother; Post-traumatic stress disorder in his father; Prostate cancer in his brother.He reports that he has been smoking cigarettes. He started smoking about 39 years ago. He has a 40.00 pack-year smoking history. He has quit using smokeless tobacco. He reports previous alcohol use. He reports that he does not use drugs.    ROS Review of Systems  Constitutional: Negative for fever.  Respiratory: Negative for shortness of breath.   Cardiovascular: Negative for chest pain.  Gastrointestinal: Positive for diarrhea (his stools are loose, but only 1-2 daily). Negative for abdominal pain, nausea and rectal pain.  Musculoskeletal: Negative for  arthralgias.  Skin: Negative for rash.    Objective:  BP (!) 167/92    Pulse (!) 108    Temp 98.4 F (36.9 C) (Temporal)    Resp 20    Ht 5\' 6"  (1.676 m)    Wt 166 lb (75.3 kg)    SpO2 96%    BMI 26.79 kg/m   BP Readings from Last 3 Encounters:  06/12/20 (!) 167/92  04/06/20 (!) 162/91  02/29/20 133/88    Wt Readings from Last 3 Encounters:  06/12/20 166 lb (75.3 kg)  04/06/20 168 lb 2 oz (76.3 kg)  02/29/20 167 lb 6.4 oz (75.9 kg)     Physical Exam Vitals reviewed.  Constitutional:      Appearance: He is well-developed.    HENT:     Head: Normocephalic and atraumatic.     Right Ear: Tympanic membrane and external ear normal. No decreased hearing noted.     Left Ear: Tympanic membrane and external ear normal. No decreased hearing noted.     Mouth/Throat:     Pharynx: No oropharyngeal exudate or posterior oropharyngeal erythema.  Eyes:     Pupils: Pupils are equal, round, and reactive to light.  Cardiovascular:     Rate and Rhythm: Normal rate and regular rhythm.     Heart sounds: No murmur heard.   Pulmonary:     Effort: No respiratory distress.     Breath sounds: Normal breath sounds.  Abdominal:     General: Bowel sounds are normal.     Palpations: Abdomen is soft. There is no mass.     Tenderness: There is no abdominal tenderness.  Musculoskeletal:     Cervical back: Normal range of motion and neck supple.       Assessment & Plan:   Other was seen today for medical management of chronic issues.  Diagnoses and all orders for this visit:  Need for immunization against influenza -     Flu Vaccine QUAD High Dose(Fluad)  Chronic pain in right shoulder -     HYDROcodone-acetaminophen (NORCO/VICODIN) 5-325 MG tablet; Take 0.5 tablets by mouth every 6 (six) hours as needed for moderate pain. -     HYDROcodone-acetaminophen (NORCO/VICODIN) 5-325 MG tablet; Take 0.5 tablets by mouth every 6 (six) hours as needed for moderate pain. -     HYDROcodone-acetaminophen (NORCO/VICODIN) 5-325 MG tablet; Take 0.5 tablets by mouth every 6 (six) hours as needed for moderate pain.  Primary osteoarthritis involving multiple joints -     HYDROcodone-acetaminophen (NORCO/VICODIN) 5-325 MG tablet; Take 0.5 tablets by mouth every 6 (six) hours as needed for moderate pain. -     HYDROcodone-acetaminophen (NORCO/VICODIN) 5-325 MG tablet; Take 0.5 tablets by mouth every 6 (six) hours as needed for moderate pain. -     HYDROcodone-acetaminophen (NORCO/VICODIN) 5-325 MG tablet; Take 0.5 tablets by mouth every 6 (six)  hours as needed for moderate pain.  Mixed simple and mucopurulent chronic bronchitis (HCC) -     fluticasone furoate-vilanterol (BREO ELLIPTA) 200-25 MCG/INH AEPB; Inhale 1 puff into the lungs daily.  Benign prostatic hyperplasia with post-void dribbling -     finasteride (PROSCAR) 5 MG tablet; TAKE 1 TABLET BY MOUTH EVERYDAY AT BEDTIME  Essential hypertension -     losartan (COZAAR) 50 MG tablet; Take 1 tablet (50 mg total) by mouth daily.  Other orders -     colestipol (COLESTID) 5 g granules; Take 5 g by mouth 2 (two) times daily. -  DULoxetine (CYMBALTA) 60 MG capsule; Take 1 capsule (60 mg total) by mouth 2 (two) times daily.       I have discontinued Aiden W. Silberman's busPIRone. I have changed his budesonide-formoterol to [employer]. I have also changed his DULoxetine. Additionally, I am having him start on HYDROcodone-acetaminophen, HYDROcodone-acetaminophen, and colestipol. Lastly, I am having him maintain his aspirin, Vitamin D3, diclofenac sodium, albuterol, HYDROcodone-acetaminophen, finasteride, and losartan.  Allergies as of 06/12/2020      Reactions   Lipitor [atorvastatin] Other (See Comments)   myalgias   Sulfa Antibiotics Itching, Rash      Medication List       Accurate as of June 12, 2020  2:47 PM. If you have any questions, ask your nurse or doctor.        STOP taking these medications   budesonide-formoterol 160-4.5 MCG/ACT inhaler Commonly known as: Symbicort Replaced by: [place] Patter 200-25 MCG/INH Aepb Stopped by: Claretta Fraise, MD   busPIRone 5 MG tablet Commonly known as: BUSPAR Stopped by: Claretta Fraise, MD     TAKE these medications   albuterol 108 (90 Base) MCG/ACT inhaler Commonly known as: VENTOLIN HFA Inhale 2 puffs into the lungs every 4 (four) hours as needed for wheezing or shortness of breath.   aspirin 325 MG tablet Take 325 mg by mouth every other day.   Breo Ellipta 200-25 MCG/INH Aepb Generic drug: fluticasone  furoate-vilanterol Inhale 1 puff into the lungs daily. Replaces: budesonide-formoterol 160-4.5 MCG/ACT inhaler Started by: Claretta Fraise, MD   colestipol 5 g granules Commonly known as: COLESTID Take 5 g by mouth 2 (two) times daily. Started by: Claretta Fraise, MD   diclofenac sodium 1 % Gel Commonly known as: Voltaren Apply 4 g topically 2 (two) times daily.   DULoxetine 60 MG capsule Commonly known as: CYMBALTA Take 1 capsule (60 mg total) by mouth 2 (two) times daily. What changed:   when to take this  additional instructions Changed by: Claretta Fraise, MD   finasteride 5 MG tablet Commonly known as: PROSCAR TAKE 1 TABLET BY MOUTH EVERYDAY AT BEDTIME   HYDROcodone-acetaminophen 5-325 MG tablet Commonly known as: NORCO/VICODIN Take 0.5 tablets by mouth every 6 (six) hours as needed for moderate pain. What changed: Another medication with the same name was added. Make sure you understand how and when to take each. Changed by: Claretta Fraise, MD   HYDROcodone-acetaminophen 5-325 MG tablet Commonly known as: NORCO/VICODIN Take 0.5 tablets by mouth every 6 (six) hours as needed for moderate pain. Start taking on: July 12, 2020 What changed: You were already taking a medication with the same name, and this prescription was added. Make sure you understand how and when to take each. Changed by: Claretta Fraise, MD   HYDROcodone-acetaminophen 5-325 MG tablet Commonly known as: NORCO/VICODIN Take 0.5 tablets by mouth every 6 (six) hours as needed for moderate pain. Start taking on: August 11, 2020 What changed: You were already taking a medication with the same name, and this prescription was added. Make sure you understand how and when to take each. Changed by: Claretta Fraise, MD   losartan 50 MG tablet Commonly known as: COZAAR Take 1 tablet (50 mg total) by mouth daily.   Vitamin D3 125 MCG (5000 UT) Caps Take 1 tablet by mouth. 2-3 x week        Follow-up:  Return in about 3 months (around 09/11/2020).  Claretta Fraise, M.D.

## 2020-07-14 ENCOUNTER — Other Ambulatory Visit: Payer: Self-pay | Admitting: Nurse Practitioner

## 2020-07-14 DIAGNOSIS — J418 Mixed simple and mucopurulent chronic bronchitis: Secondary | ICD-10-CM

## 2020-09-05 ENCOUNTER — Encounter: Payer: Self-pay | Admitting: Family Medicine

## 2020-09-05 ENCOUNTER — Other Ambulatory Visit: Payer: Self-pay

## 2020-09-05 ENCOUNTER — Ambulatory Visit (INDEPENDENT_AMBULATORY_CARE_PROVIDER_SITE_OTHER): Payer: Medicare Other | Admitting: Family Medicine

## 2020-09-05 VITALS — BP 161/91 | HR 100 | Temp 97.8°F | Resp 20 | Ht 66.0 in | Wt 169.1 lb

## 2020-09-05 DIAGNOSIS — N3943 Post-void dribbling: Secondary | ICD-10-CM

## 2020-09-05 DIAGNOSIS — J418 Mixed simple and mucopurulent chronic bronchitis: Secondary | ICD-10-CM

## 2020-09-05 DIAGNOSIS — M25511 Pain in right shoulder: Secondary | ICD-10-CM | POA: Diagnosis not present

## 2020-09-05 DIAGNOSIS — J441 Chronic obstructive pulmonary disease with (acute) exacerbation: Secondary | ICD-10-CM | POA: Diagnosis not present

## 2020-09-05 DIAGNOSIS — M159 Polyosteoarthritis, unspecified: Secondary | ICD-10-CM

## 2020-09-05 DIAGNOSIS — M150 Primary generalized (osteo)arthritis: Secondary | ICD-10-CM

## 2020-09-05 DIAGNOSIS — N401 Enlarged prostate with lower urinary tract symptoms: Secondary | ICD-10-CM

## 2020-09-05 DIAGNOSIS — M8949 Other hypertrophic osteoarthropathy, multiple sites: Secondary | ICD-10-CM

## 2020-09-05 DIAGNOSIS — G8929 Other chronic pain: Secondary | ICD-10-CM

## 2020-09-05 MED ORDER — TAMSULOSIN HCL 0.4 MG PO CAPS
0.8000 mg | ORAL_CAPSULE | Freq: Every day | ORAL | 3 refills | Status: DC
Start: 2020-09-05 — End: 2021-09-05

## 2020-09-05 MED ORDER — HYDROCODONE-ACETAMINOPHEN 5-325 MG PO TABS: 0.5000 | ORAL_TABLET | Freq: Four times a day (QID) | ORAL | 0 refills | Status: AC | PRN

## 2020-09-05 MED ORDER — PREDNISONE 10 MG PO TABS: ORAL_TABLET | ORAL | 0 refills | Status: AC

## 2020-09-05 MED ORDER — BUDESONIDE-FORMOTEROL FUMARATE 160-4.5 MCG/ACT IN AERO: 2.0000 | INHALATION_SPRAY | Freq: Two times a day (BID) | RESPIRATORY_TRACT | 3 refills | Status: DC

## 2020-09-05 NOTE — Progress Notes (Signed)
Subjective:  Patient ID: Joshua Bowman, male    DOB: 27-Jan-1946  Age: 74 y.o. MRN: [id number]  CC: Medical Management of Chronic Issues   HPI Joshua Bowman presents for follow-up on his chronic pain.  He has a NARx score of 361 for narcotics, 200 for sedatives, 0 for stimulants.  His overdose risk score is 150/1000 he has consistently filled the hydrocodone monthly for 60 tablets of the 5 mg strength for a total of 10 morphine milligram equivalents per day.  This is down from 20 morphine milligram equivalents this time last year.  He takes the medication for arthritis in multiple joints. Worst at neck and knees.  He uses Voltaren gel to help mitigate the need for the pain pills as well.   Follow-up of hypertension. Patient has no history of headache chest pain or shortness of breath or recent cough. Patient also denies symptoms of TIA such as numbness weakness lateralizing. Patient checks  blood pressure at home and has not had any elevated readings recentlyLog brought in showing readings taken approximately twice weekly.  Once weekly in the mornings and once weekly in the evenings.  Each of these readings is within normal range with the exception of the September visit on the 14th that month he was 162/91.  Each of the other readings remains in the 696E systolic and in the 95M diastolic.  This is in spite of having discontinued his losartan.  Of note is that he has been a smoker of greater than a pack a day for over 30 years.  He reports dyspnea.  He has posterior neck knee and low back pain with activities that will raise his pain to an 8-9/10.  When he uses his opiate it is reduced to 2/10 temporarily.  Patient tells me now that his Memory Dance is just not working as well for him as the Symbicort did in the past.  He would like to resume the Symbicort.  We discussed also his need for use of the rescue inhaler.  Depression screen [facility] 2/9 09/05/2020 06/12/2020 02/29/2020  Decreased Interest 0 0 2  Down,  Depressed, Hopeless 0 0 1  PHQ - 2 Score 0 0 3  Altered sleeping - - 2  Tired, decreased energy - - 1  Change in appetite - - 0  Feeling bad or failure about yourself  - - 1  Trouble concentrating - - 1  Moving slowly or fidgety/restless - - 1  Suicidal thoughts - - 0  PHQ-9 Score - - 9  Difficult doing work/chores - - Somewhat difficult  Some recent data might be hidden    History Joshua Bowman has a past medical history of Anxiety, Arthritis, Bilateral carotid artery stenosis, Colon polyps - attenuated polyposis syndrome, COPD (chronic obstructive pulmonary disease) ([place]), Duodenitis, Gastritis, Hemorrhoids, Hyperlipidemia, Hyperplasia of prostate, IBS (irritable bowel syndrome), Pre-diabetes, and Sigmoid diverticulosis.   He has a past surgical history that includes Appendectomy (1969); nasal surgery ( spurs ) (70's); lipoma rt shoulder (01/2001); rt. shoulder -rotator cuff repair (08/2005); Colonoscopy; Esophagogastroduodenoscopy; Tonsillectomy; and Eye surgery.   His family history includes Arthritis in his sister; Congestive Heart Failure in his father; Heart attack in his brother; Heart disease in his brother and mother; Post-traumatic stress disorder in his father; Prostate cancer in his brother.He reports that he has been smoking cigarettes. He started smoking about 39 years ago. He has a 40.00 pack-year smoking history. He has quit using smokeless tobacco. He reports previous alcohol use. He  reports that he does not use drugs.    ROS Review of Systems  Constitutional: Negative.   HENT: Negative.   Eyes: Negative for visual disturbance.  Respiratory: Positive for cough, shortness of breath and wheezing.   Cardiovascular: Negative for chest pain and leg swelling.  Gastrointestinal: Negative for abdominal pain, diarrhea, nausea and vomiting.  Genitourinary: Positive for difficulty urinating and frequency (with dribbling).  Musculoskeletal: Positive for arthralgias, back pain and  myalgias.  Skin: Negative for rash.  Neurological: Negative for headaches.  Psychiatric/Behavioral: Negative for sleep disturbance.    Objective:  BP (!) 161/91    Pulse 100    Temp 97.8 F (36.6 C) (Temporal)    Resp 20    Ht 5\' 6"  (1.676 m)    Wt 169 lb 2 oz (76.7 kg)    SpO2 97%    BMI 27.30 kg/m   BP Readings from Last 3 Encounters:  09/05/20 (!) 161/91  06/12/20 (!) 167/92  04/06/20 (!) 162/91    Wt Readings from Last 3 Encounters:  09/05/20 169 lb 2 oz (76.7 kg)  06/12/20 166 lb (75.3 kg)  04/06/20 168 lb 2 oz (76.3 kg)     Physical Exam Constitutional:      General: He is not in acute distress.    Appearance: He is well-developed and well-nourished.  HENT:     Head: Normocephalic and atraumatic.     Right Ear: External ear normal.     Left Ear: External ear normal.     Nose: Nose normal.     Mouth/Throat:     Mouth: Oropharynx is clear and moist.  Eyes:     Extraocular Movements: EOM normal.     Conjunctiva/sclera: Conjunctivae normal.     Pupils: Pupils are equal, round, and reactive to light.  Cardiovascular:     Rate and Rhythm: Normal rate and regular rhythm.     Heart sounds: Normal heart sounds. No murmur heard.   Pulmonary:     Effort: Pulmonary effort is normal. No respiratory distress.     Breath sounds: Wheezing present. No rales.  Abdominal:     Palpations: Abdomen is soft.     Tenderness: There is no abdominal tenderness.  Musculoskeletal:        General: Normal range of motion.     Cervical back: Normal range of motion and neck supple.  Skin:    General: Skin is warm and dry.  Neurological:     Mental Status: He is alert and oriented to person, place, and time.     Deep Tendon Reflexes: Reflexes are normal and symmetric.  Psychiatric:        Mood and Affect: Mood and affect normal.        Behavior: Behavior normal.        Thought Content: Thought content normal.        Judgment: Judgment normal.       Assessment & Plan:   Joshua Bowman  was seen today for medical management of chronic issues.  Diagnoses and all orders for this visit:  Acute exacerbation of chronic obstructive pulmonary disease (COPD) (HCC)  Chronic pain in right shoulder -     HYDROcodone-acetaminophen (NORCO/VICODIN) 5-325 MG tablet; Take 0.5 tablets by mouth every 6 (six) hours as needed for moderate pain. -     HYDROcodone-acetaminophen (NORCO/VICODIN) 5-325 MG tablet; Take 0.5 tablets by mouth every 6 (six) hours as needed for moderate pain. -     HYDROcodone-acetaminophen (NORCO/VICODIN) 5-325 MG tablet;  Take 0.5 tablets by mouth every 6 (six) hours as needed for moderate pain.  Primary osteoarthritis involving multiple joints -     HYDROcodone-acetaminophen (NORCO/VICODIN) 5-325 MG tablet; Take 0.5 tablets by mouth every 6 (six) hours as needed for moderate pain. -     HYDROcodone-acetaminophen (NORCO/VICODIN) 5-325 MG tablet; Take 0.5 tablets by mouth every 6 (six) hours as needed for moderate pain. -     HYDROcodone-acetaminophen (NORCO/VICODIN) 5-325 MG tablet; Take 0.5 tablets by mouth every 6 (six) hours as needed for moderate pain.  Mixed simple and mucopurulent chronic bronchitis (HCC)  Benign prostatic hyperplasia with post-void dribbling  Other orders -     budesonide-formoterol (SYMBICORT) 160-4.5 MCG/ACT inhaler; Inhale 2 puffs into the lungs 2 (two) times daily. -     predniSONE (DELTASONE) 10 MG tablet; Take 5 daily for 2 days followed by 4,3,2 and 1 for 2 days each. -     tamsulosin (FLOMAX) 0.4 MG CAPS capsule; Take 2 capsules (0.8 mg total) by mouth at bedtime. For urine flow and prostate       I have discontinued Joshua Bowman's Breo Ellipta, colestipol, finasteride, and losartan. I am also having him start on HYDROcodone-acetaminophen, budesonide-formoterol, HYDROcodone-acetaminophen, predniSONE, and tamsulosin. Additionally, I am having him maintain his aspirin, Vitamin D3, diclofenac sodium, albuterol, DULoxetine, and  HYDROcodone-acetaminophen.  Allergies as of 09/05/2020      Reactions   Lipitor [atorvastatin] Other (See Comments)   myalgias   Sulfa Antibiotics Itching, Rash      Medication List       Accurate as of September 05, 2020 11:59 PM. If you have any questions, ask your nurse or doctor.        STOP taking these medications   Breo Ellipta 200-25 MCG/INH Aepb Generic drug: fluticasone furoate-vilanterol Stopped by: Claretta Fraise, MD   colestipol 5 g granules Commonly known as: COLESTID Stopped by: Claretta Fraise, MD   finasteride 5 MG tablet Commonly known as: PROSCAR Stopped by: Claretta Fraise, MD   losartan 50 MG tablet Commonly known as: COZAAR Stopped by: Claretta Fraise, MD     TAKE these medications   albuterol 108 (90 Base) MCG/ACT inhaler Commonly known as: VENTOLIN HFA Inhale 2 puffs into the lungs every 4 (four) hours as needed for wheezing or shortness of breath.   aspirin 325 MG tablet Take 325 mg by mouth every other day.   budesonide-formoterol 160-4.5 MCG/ACT inhaler Commonly known as: SYMBICORT Inhale 2 puffs into the lungs 2 (two) times daily. Start taking on: November 04, 2020 Started by: Claretta Fraise, MD   diclofenac sodium 1 % Gel Commonly known as: Voltaren Apply 4 g topically 2 (two) times daily.   DULoxetine 60 MG capsule Commonly known as: CYMBALTA Take 1 capsule (60 mg total) by mouth 2 (two) times daily.   HYDROcodone-acetaminophen 5-325 MG tablet Commonly known as: NORCO/VICODIN Take 0.5 tablets by mouth every 6 (six) hours as needed for moderate pain. What changed: Another medication with the same name was added. Make sure you understand how and when to take each. Changed by: Claretta Fraise, MD   HYDROcodone-acetaminophen 5-325 MG tablet Commonly known as: NORCO/VICODIN Take 0.5 tablets by mouth every 6 (six) hours as needed for moderate pain. Start taking on: October 05, 2020 What changed: You were already taking a medication with the  same name, and this prescription was added. Make sure you understand how and when to take each. Changed by: Claretta Fraise, MD   HYDROcodone-acetaminophen 5-325 MG  tablet Commonly known as: NORCO/VICODIN Take 0.5 tablets by mouth every 6 (six) hours as needed for moderate pain. Start taking on: November 04, 2020 What changed: You were already taking a medication with the same name, and this prescription was added. Make sure you understand how and when to take each. Changed by: Claretta Fraise, MD   predniSONE 10 MG tablet Commonly known as: DELTASONE Take 5 daily for 2 days followed by 4,3,2 and 1 for 2 days each. Started by: Claretta Fraise, MD   tamsulosin 0.4 MG Caps capsule Commonly known as: FLOMAX Take 2 capsules (0.8 mg total) by mouth at bedtime. For urine flow and prostate Started by: Claretta Fraise, MD   Vitamin D3 125 MCG (5000 UT) Caps Take 1 tablet by mouth. 2-3 x week        Follow-up: No follow-ups on file.  Claretta Fraise, M.D.

## 2020-09-09 ENCOUNTER — Encounter: Payer: Self-pay | Admitting: Family Medicine

## 2020-09-30 ENCOUNTER — Other Ambulatory Visit: Payer: Self-pay | Admitting: Family Medicine

## 2020-12-05 ENCOUNTER — Ambulatory Visit (INDEPENDENT_AMBULATORY_CARE_PROVIDER_SITE_OTHER): Payer: Medicare Other | Admitting: Family Medicine

## 2020-12-05 ENCOUNTER — Encounter: Payer: Self-pay | Admitting: Family Medicine

## 2020-12-05 ENCOUNTER — Other Ambulatory Visit: Payer: Self-pay

## 2020-12-05 VITALS — BP 129/86 | HR 100 | Temp 98.0°F | Ht 66.0 in | Wt 170.6 lb

## 2020-12-05 DIAGNOSIS — E559 Vitamin D deficiency, unspecified: Secondary | ICD-10-CM | POA: Diagnosis not present

## 2020-12-05 DIAGNOSIS — I10 Essential (primary) hypertension: Secondary | ICD-10-CM

## 2020-12-05 DIAGNOSIS — E7800 Pure hypercholesterolemia, unspecified: Secondary | ICD-10-CM

## 2020-12-05 DIAGNOSIS — F339 Major depressive disorder, recurrent, unspecified: Secondary | ICD-10-CM

## 2020-12-05 DIAGNOSIS — Z125 Encounter for screening for malignant neoplasm of prostate: Secondary | ICD-10-CM

## 2020-12-05 MED ORDER — HYDROCODONE-ACETAMINOPHEN 5-325 MG PO TABS: 0.5000 | ORAL_TABLET | Freq: Four times a day (QID) | ORAL | 0 refills | Status: DC | PRN

## 2020-12-05 MED ORDER — FEXOFENADINE HCL 180 MG PO TABS: 180.0000 mg | ORAL_TABLET | Freq: Every day | ORAL | 11 refills | Status: DC

## 2020-12-05 MED ORDER — HYDROCODONE-ACETAMINOPHEN 5-325 MG PO TABS
0.5000 | ORAL_TABLET | Freq: Four times a day (QID) | ORAL | 0 refills | Status: DC | PRN
Start: 2021-01-07 — End: 2021-03-08

## 2020-12-05 MED ORDER — HYDROCODONE-ACETAMINOPHEN 5-325 MG PO TABS
0.5000 | ORAL_TABLET | Freq: Four times a day (QID) | ORAL | 0 refills | Status: DC | PRN
Start: 2021-02-06 — End: 2021-03-08

## 2020-12-05 NOTE — Progress Notes (Signed)
Subjective:  Patient ID: Joshua Bowman, male    DOB: September 21, 1946  Age: 75 y.o. MRN: [id number]  CC: Medical Management of Chronic Issues   HPI Joshua Bowman presents for multiple joints having chronic pain.  Force this he takes hydrocodone 5/325 1/2 TABLET in the morning and another half in the evening.sometimes will take another half tablet in the afternoon and a half during the night if he wakes up in pain.  Pain is moderate.  Is trying to keep his use down to a minimum.  However this is been his main source of pain control for many many years.  He has reduced from 120 pills down to 60/month over time.  Follow-up hypertension.  He brings in multiple readings showing systolic in the 097D consistently and the diastolic in the 53G consistently.  Patient is also followed for COPD.  He is using the Symbicort 2 puffs twice a day regularly.  He has to supplement with an albuterol rescue treatment about twice a week.  He says this gets worse during allergy season. Depression screen [facility] 2/9 12/05/2020 09/05/2020 06/12/2020  Decreased Interest 0 0 0  Down, Depressed, Hopeless 0 0 0  PHQ - 2 Score 0 0 0  Altered sleeping - - -  Tired, decreased energy - - -  Change in appetite - - -  Feeling bad or failure about yourself  - - -  Trouble concentrating - - -  Moving slowly or fidgety/restless - - -  Suicidal thoughts - - -  PHQ-9 Score - - -  Difficult doing work/chores - - -  Some recent data might be hidden    History Joshua Bowman has a past medical history of Anxiety, Arthritis, Bilateral carotid artery stenosis, Colon polyps - attenuated polyposis syndrome, COPD (chronic obstructive pulmonary disease) ([place]), Duodenitis, Gastritis, Hemorrhoids, Hyperlipidemia, Hyperplasia of prostate, IBS (irritable bowel syndrome), Pre-diabetes, and Sigmoid diverticulosis.   He has a past surgical history that includes Appendectomy (1969); nasal surgery ( spurs ) (70's); lipoma rt shoulder (01/2001); rt. shoulder  -rotator cuff repair (08/2005); Colonoscopy; Esophagogastroduodenoscopy; Tonsillectomy; and Eye surgery.   His family history includes Arthritis in his sister; Congestive Heart Failure in his father; Heart attack in his brother; Heart disease in his brother and mother; Post-traumatic stress disorder in his father; Prostate cancer in his brother.He reports that he has been smoking cigarettes. He started smoking about 40 years ago. He has a 40.00 pack-year smoking history. He has quit using smokeless tobacco. He reports previous alcohol use. He reports that he does not use drugs.    ROS Review of Systems  Constitutional: Negative for fever.  Respiratory: Negative for shortness of breath.   Cardiovascular: Negative for chest pain.  Musculoskeletal: Negative for arthralgias.  Skin: Negative for rash.    Objective:  BP 129/86   Pulse 100   Temp 98 F (36.7 C)   Ht $R'5\' 6"'Gh$  (1.676 m)   Wt 170 lb 9.6 oz (77.4 kg)   SpO2 97%   BMI 27.54 kg/m   BP Readings from Last 3 Encounters:  12/05/20 129/86  09/05/20 (!) 161/91  06/12/20 (!) 167/92    Wt Readings from Last 3 Encounters:  12/05/20 170 lb 9.6 oz (77.4 kg)  09/05/20 169 lb 2 oz (76.7 kg)  06/12/20 166 lb (75.3 kg)     Physical Exam Vitals reviewed.  Constitutional:      Appearance: He is well-developed and well-nourished.  HENT:     Head: Normocephalic and  atraumatic.     Right Ear: Tympanic membrane and external ear normal. No decreased hearing noted.     Left Ear: Tympanic membrane and external ear normal. No decreased hearing noted.     Mouth/Throat:     Pharynx: No oropharyngeal exudate or posterior oropharyngeal erythema.  Eyes:     Pupils: Pupils are equal, round, and reactive to light.  Cardiovascular:     Rate and Rhythm: Normal rate and regular rhythm.     Heart sounds: No murmur heard.   Pulmonary:     Effort: No respiratory distress.     Breath sounds: Normal breath sounds.  Abdominal:     General: Bowel  sounds are normal.     Palpations: Abdomen is soft. There is no mass.     Tenderness: There is no abdominal tenderness.  Musculoskeletal:     Cervical back: Normal range of motion and neck supple.       Assessment & Plan:   Joshua Bowman was seen today for medical management of chronic issues.  Diagnoses and all orders for this visit:  Essential hypertension -     CMP14+EGFR -     CBC with Differential/Platelet  Depression, recurrent (HCC) -     Thyroid Panel With TSH  Vitamin D deficiency -     VITAMIN D 25 Hydroxy (Vit-D Deficiency, Fractures)  Pure hypercholesterolemia -     Lipid panel  Prostate cancer screening -     PSA, total and free  Other orders -     HYDROcodone-acetaminophen (NORCO/VICODIN) 5-325 MG tablet; Take 0.5 tablets by mouth every 6 (six) hours as needed for moderate pain. -     fexofenadine (ALLEGRA) 180 MG tablet; Take 1 tablet (180 mg total) by mouth daily. For allergy symptoms -     HYDROcodone-acetaminophen (NORCO/VICODIN) 5-325 MG tablet; Take 0.5 tablets by mouth every 6 (six) hours as needed for moderate pain. -     HYDROcodone-acetaminophen (NORCO/VICODIN) 5-325 MG tablet; Take 0.5 tablets by mouth every 6 (six) hours as needed for moderate pain.       I am having Joshua Bowman start on fexofenadine, HYDROcodone-acetaminophen, and HYDROcodone-acetaminophen. I am also having him maintain his aspirin, Vitamin D3, diclofenac sodium, albuterol, budesonide-formoterol, tamsulosin, DULoxetine, and HYDROcodone-acetaminophen.  Allergies as of 12/05/2020      Reactions   Lipitor [atorvastatin] Other (See Comments)   myalgias   Sulfa Antibiotics Itching, Rash      Medication List       Accurate as of December 05, 2020  3:10 PM. If you have any questions, ask your nurse or doctor.        albuterol 108 (90 Base) MCG/ACT inhaler Commonly known as: VENTOLIN HFA Inhale 2 puffs into the lungs every 4 (four) hours as needed for wheezing or shortness of  breath.   aspirin 325 MG tablet Take 325 mg by mouth every other day.   budesonide-formoterol 160-4.5 MCG/ACT inhaler Commonly known as: SYMBICORT Inhale 2 puffs into the lungs 2 (two) times daily.   diclofenac sodium 1 % Gel Commonly known as: Voltaren Apply 4 g topically 2 (two) times daily.   DULoxetine 60 MG capsule Commonly known as: CYMBALTA TAKE 1 CAPSULE (60 MG TOTAL) BY MOUTH 2 (TWO) TIMES DAILY.   fexofenadine 180 MG tablet Commonly known as: ALLEGRA Take 1 tablet (180 mg total) by mouth daily. For allergy symptoms Started by: Claretta Fraise, MD   HYDROcodone-acetaminophen 5-325 MG tablet Commonly known as: NORCO/VICODIN Take 0.5 tablets by  mouth every 6 (six) hours as needed for moderate pain. What changed: Another medication with the same name was added. Make sure you understand how and when to take each. Changed by: Claretta Fraise, MD   HYDROcodone-acetaminophen 5-325 MG tablet Commonly known as: NORCO/VICODIN Take 0.5 tablets by mouth every 6 (six) hours as needed for moderate pain. Start taking on: January 07, 2021 What changed: You were already taking a medication with the same name, and this prescription was added. Make sure you understand how and when to take each. Changed by: Claretta Fraise, MD   HYDROcodone-acetaminophen 5-325 MG tablet Commonly known as: NORCO/VICODIN Take 0.5 tablets by mouth every 6 (six) hours as needed for moderate pain. Start taking on: Feb 06, 2021 What changed: You were already taking a medication with the same name, and this prescription was added. Make sure you understand how and when to take each. Changed by: Claretta Fraise, MD   tamsulosin 0.4 MG Caps capsule Commonly known as: FLOMAX Take 2 capsules (0.8 mg total) by mouth at bedtime. For urine flow and prostate   Vitamin D3 125 MCG (5000 UT) Caps Take 1 tablet by mouth. 2-3 x week        Follow-up: No follow-ups on file.  Claretta Fraise, M.D.

## 2020-12-06 LAB — THYROID PANEL WITH TSH
Free Thyroxine Index: 2.2 (ref 1.2–4.9)
T3 Uptake Ratio: 27 % (ref 24–39)
T4, Total: 8.1 ug/dL (ref 4.5–12.0)
TSH: 1.01 u[IU]/mL (ref 0.450–4.500)

## 2020-12-06 LAB — LIPID PANEL
Chol/HDL Ratio: 3.6 ratio (ref 0.0–5.0)
Cholesterol, Total: 201 mg/dL — ABNORMAL HIGH (ref 100–199)
HDL: 56 mg/dL (ref >39)
LDL Chol Calc (NIH): 133 mg/dL — ABNORMAL HIGH (ref 0–99)
Triglycerides: 68 mg/dL (ref 0–149)
VLDL Cholesterol Cal: 12 mg/dL (ref 5–40)

## 2020-12-06 LAB — CMP14+EGFR
ALT: 13 IU/L (ref 0–44)
AST: 19 IU/L (ref 0–40)
Albumin/Globulin Ratio: 1.9 (ref 1.2–2.2)
Albumin: 4.2 g/dL (ref 3.7–4.7)
Alkaline Phosphatase: 55 IU/L (ref 44–121)
BUN/Creatinine Ratio: 11 (ref 10–24)
BUN: 11 mg/dL (ref 8–27)
Bilirubin Total: 0.3 mg/dL (ref 0.0–1.2)
CO2: 25 mmol/L (ref 20–29)
Calcium: 9.9 mg/dL (ref 8.6–10.2)
Chloride: 98 mmol/L (ref 96–106)
Creatinine, Ser: 0.96 mg/dL (ref 0.76–1.27)
Globulin, Total: 2.2 g/dL (ref 1.5–4.5)
Glucose: 98 mg/dL (ref 65–99)
Potassium: 4.8 mmol/L (ref 3.5–5.2)
Sodium: 138 mmol/L (ref 134–144)
Total Protein: 6.4 g/dL (ref 6.0–8.5)
eGFR: 83 mL/min/{1.73_m2} (ref >59)

## 2020-12-06 LAB — CBC WITH DIFFERENTIAL/PLATELET
Basophils Absolute: 0.1 10*3/uL (ref 0.0–0.2)
Basos: 1 %
EOS (ABSOLUTE): 0.1 10*3/uL (ref 0.0–0.4)
Eos: 1 %
Hematocrit: 52.1 % — ABNORMAL HIGH (ref 37.5–51.0)
Hemoglobin: 17.4 g/dL (ref 13.0–17.7)
Immature Grans (Abs): 0 10*3/uL (ref 0.0–0.1)
Immature Granulocytes: 0 %
Lymphocytes Absolute: 2.4 10*3/uL (ref 0.7–3.1)
Lymphs: 27 %
MCH: 31.6 pg (ref 26.6–33.0)
MCHC: 33.4 g/dL (ref 31.5–35.7)
MCV: 95 fL (ref 79–97)
Monocytes Absolute: 0.8 10*3/uL (ref 0.1–0.9)
Monocytes: 9 %
Neutrophils Absolute: 5.5 10*3/uL (ref 1.4–7.0)
Neutrophils: 62 %
Platelets: 266 10*3/uL (ref 150–450)
RBC: 5.5 x10E6/uL (ref 4.14–5.80)
RDW: 13 % (ref 11.6–15.4)
WBC: 8.9 10*3/uL (ref 3.4–10.8)

## 2020-12-06 LAB — VITAMIN D 25 HYDROXY (VIT D DEFICIENCY, FRACTURES): Vit D, 25-Hydroxy: 40.8 ng/mL (ref 30.0–100.0)

## 2020-12-06 LAB — PSA, TOTAL AND FREE
PSA, Free Pct: 13.3 %
PSA, Free: 0.36 ng/mL
Prostate Specific Ag, Serum: 2.7 ng/mL (ref 0.0–4.0)

## 2020-12-07 ENCOUNTER — Other Ambulatory Visit: Payer: Self-pay | Admitting: Family Medicine

## 2020-12-13 ENCOUNTER — Telehealth: Payer: Self-pay | Admitting: *Deleted

## 2020-12-13 NOTE — Telephone Encounter (Signed)
Pt wants Rx sent to [facility].

## 2020-12-13 NOTE — Telephone Encounter (Signed)
[facility] is currently out of pt's Hydrocodone, Pt's mailbox is full to ask what pharmacy we can send this to

## 2020-12-14 MED ORDER — HYDROCODONE-ACETAMINOPHEN 5-325 MG PO TABS: 0.5000 | ORAL_TABLET | Freq: Four times a day (QID) | ORAL | 0 refills | Status: DC | PRN

## 2020-12-14 NOTE — Addendum Note (Signed)
Addended by: Chevis Pretty on: 12/14/2020 01:23 PM   Modules accepted: Orders

## 2020-12-20 ENCOUNTER — Encounter: Payer: Self-pay | Admitting: *Deleted

## 2021-01-07 ENCOUNTER — Other Ambulatory Visit: Payer: Self-pay | Admitting: Family Medicine

## 2021-02-26 ENCOUNTER — Ambulatory Visit (INDEPENDENT_AMBULATORY_CARE_PROVIDER_SITE_OTHER): Payer: Medicare Other

## 2021-02-26 VITALS — Ht 66.0 in | Wt 167.0 lb

## 2021-02-26 DIAGNOSIS — Z0000 Encounter for general adult medical examination without abnormal findings: Secondary | ICD-10-CM

## 2021-02-26 NOTE — Progress Notes (Signed)
Subjective:   BONNY EGGER is a 75 y.o. male who presents for Medicare Annual/Subsequent preventive examination.  Virtual Visit via Telephone Note  I connected with  [name] on 02/26/21 at  2:45 PM EDT by telephone and verified that I am speaking with the correct person using two identifiers.   Location: Patient: Home Provider: [facility] Persons participating in the virtual visit: patient/Nurse Health Advisor   I discussed the limitations, risks, security and privacy concerns of performing an evaluation and management service by telephone and the availability of in person appointments. The patient expressed understanding and agreed to proceed.  Interactive audio and video telecommunications were attempted between this nurse and patient, however failed, due to patient having technical difficulties OR patient did not have access to video capability.  We continued and completed visit with audio only.  Some vital signs may be absent or patient reported.   Leiloni Smithers E Lilyahna Sirmon, LPN   Review of Systems     Cardiac Risk Factors include: advanced age (>45men, >24 women);sedentary lifestyle;smoking/ tobacco exposure;hypertension;male gender;dyslipidemia     Objective:    Today's Vitals   02/26/21 1452  Weight: 167 lb (75.8 kg)  Height: 5\' 6"  (1.676 m)  PainSc: 7    Body mass index is 26.95 kg/m.  Advanced Directives 02/26/2021 02/23/2020 02/16/2019 11/19/2017  Does Patient Have a Medical Advance Directive? No No No No  Would patient like information on creating a medical advance directive? Yes (MAU/Ambulatory/Procedural Areas - Information given) Yes (MAU/Ambulatory/Procedural Areas - Information given) No - Patient declined No - Patient declined    Current Medications (verified) Outpatient Encounter Medications as of 02/26/2021  Medication Sig  . albuterol (VENTOLIN HFA) 108 (90 Base) MCG/ACT inhaler Inhale 2 puffs into the lungs every 4 (four) hours as needed for wheezing or  shortness of breath.  Marland Kitchen aspirin 325 MG tablet Take 325 mg by mouth every other day.   . Cholecalciferol (VITAMIN D3) 5000 UNITS CAPS Take 1 tablet by mouth. 2-3 x week  . diclofenac sodium (VOLTAREN) 1 % GEL Apply 4 g topically 2 (two) times daily.  . DULoxetine (CYMBALTA) 60 MG capsule TAKE 1 CAPSULE BY MOUTH 2 TIMES DAILY.  . fexofenadine (ALLEGRA) 180 MG tablet Take 1 tablet (180 mg total) by mouth daily. For allergy symptoms  . HYDROcodone-acetaminophen (NORCO/VICODIN) 5-325 MG tablet Take 0.5 tablets by mouth every 6 (six) hours as needed for moderate pain.  Marland Kitchen HYDROcodone-acetaminophen (NORCO/VICODIN) 5-325 MG tablet Take 0.5 tablets by mouth every 6 (six) hours as needed for moderate pain.  Marland Kitchen HYDROcodone-acetaminophen (NORCO/VICODIN) 5-325 MG tablet Take 0.5 tablets by mouth every 6 (six) hours as needed for moderate pain.  . SYMBICORT 160-4.5 MCG/ACT inhaler INHALE 2 PUFFS INTO THE LUNGS 2 TIMES DAILY  . tamsulosin (FLOMAX) 0.4 MG CAPS capsule Take 2 capsules (0.8 mg total) by mouth at bedtime. For urine flow and prostate   No facility-administered encounter medications on file as of 02/26/2021.    Allergies (verified) Lipitor [atorvastatin] and Sulfa antibiotics   History: Past Medical History:  Diagnosis Date  . Anxiety   . Arthritis   . Bilateral carotid artery stenosis    Minimal 2012   . Colon polyps - attenuated polyposis syndrome    multiple   . COPD (chronic obstructive pulmonary disease) ([place])   . Duodenitis   . Gastritis   . Hemorrhoids   . Hyperlipidemia   . Hyperplasia of prostate   . IBS (irritable bowel syndrome)   .  Pre-diabetes   . Sigmoid diverticulosis    mild    Past Surgical History:  Procedure Laterality Date  . APPENDECTOMY  1969  . COLONOSCOPY    . ESOPHAGOGASTRODUODENOSCOPY    . EYE SURGERY     laser - left eye - Dr Zigmund Daniel  . lipoma rt shoulder  01/2001   outpatient   . nasal surgery ( spurs )  70's  . rt. shoulder -rotator cuff repair   08/2005   Dr. Maxie Better   . TONSILLECTOMY     Family History  Problem Relation Age of Onset  . Heart disease Mother        Atrial fib  . Prostate cancer Brother   . Heart disease Brother   . Congestive Heart Failure Father   . Post-traumatic stress disorder Father   . Arthritis Sister        back issues   . Heart attack Brother    Social History   Socioeconomic History  . Marital status: Married    Spouse name: dale   . Number of children: 1  . Years of education: 63  . Highest education level: Associate degree: occupational, Hotel manager, or vocational program  Occupational History  . 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 Smoking status: Current Every Day Smoker    Packs/day: 1.00    Years: 40.00    Pack years: 40.00    Types: Cigarettes    Start date: 09/29/1980  . Smokeless tobacco: Former Network engineer  . Vaping Use: Never used  Substance and Sexual Activity  . Alcohol use: Not Currently  . Drug use: No  . Sexual activity: Yes  Other Topics Concern  . Not on file  Social History Narrative   Retired from a nursing home.  Lives with second wife.    Social Determinants of Health   Financial Resource Strain: Low Risk   . Difficulty of Paying Living Expenses: Not hard at all  Food Insecurity: No Food Insecurity  . Worried About Charity fundraiser in the Last Year: Never true  . Ran Out of Food in the Last Year: Never true  Transportation Needs: No Transportation Needs  . Lack of Transportation (Medical): No  . Lack of Transportation (Non-Medical): No  Physical Activity: Insufficiently Active  . Days of Exercise per Week: 4 days  . Minutes of Exercise per Session: 20 min  Stress: No Stress Concern Present  . Feeling of Stress : Only a little  Social Connections: Socially Integrated  . Frequency of Communication with Friends and Family: More than three times a week  . Frequency of Social Gatherings with Friends and  Family: More than three times a week  . Attends Religious Services: More than 4 times per year  . Active Member of Clubs or Organizations: Yes  . Attends Archivist Meetings: More than 4 times per year  . Marital Status: Married    Tobacco Counseling Ready to quit: Not Answered Counseling given: Not Answered   Clinical Intake:  Pre-visit preparation completed: Yes  Pain : 0-10 Pain Score: 7  Pain Type: Chronic pain Pain Location: Generalized Pain Descriptors / Indicators: Aching Pain Onset: More than a month ago Pain Frequency: Intermittent     BMI - recorded: 26.95 Nutritional Status: BMI 25 -29 Overweight Nutritional Risks: None Diabetes: No  How often do you need to have someone help you when you read instructions, pamphlets, or  other written materials from your doctor or pharmacy?: 1 - Never  Diabetic? No  Interpreter Needed?: No  Information entered by :: Shreya Lacasse, LPN   Activities of Daily Living In your present state of health, do you have any difficulty performing the following activities: 02/26/2021  Hearing? N  Vision? N  Difficulty concentrating or making decisions? N  Walking or climbing stairs? Y  Dressing or bathing? N  Doing errands, shopping? N  Preparing Food and eating ? N  Using the Toilet? N  In the past six months, have you accidently leaked urine? Y  Do you have problems with loss of bowel control? N  Managing your Medications? N  Managing your Finances? N  Housekeeping or managing your Housekeeping? N  Some recent data might be hidden    Patient Care Team: Claretta Fraise, MD as PCP - General (Family Medicine) Minus Breeding, MD as Consulting Physician (Cardiology) Susa Day, MD as Consulting Physician (Orthopedic Surgery) Gatha Mayer, MD as Consulting Physician (Gastroenterology) Harlen Labs, MD as Referring Physician (Optometry) Hayden Pedro, MD as Consulting Physician (Ophthalmology)  Indicate  any recent Medical Services you may have received from other than [facility] providers in the past year (date may be approximate).     Assessment:   This is a routine wellness examination for Alexie.  Hearing/Vision screen  Hearing Screening   125Hz  250Hz  500Hz  1000Hz  2000Hz  3000Hz  4000Hz  6000Hz  8000Hz   Right ear:           Left ear:           Comments: Denies hearing difficulties   Vision Screening Comments: Wears eyeglasses - Last seen by Dr Marin Comment in [place] - no eye exam in ~5 years  Dietary issues and exercise activities discussed: Current Exercise Habits: The patient does not participate in regular exercise at present, Exercise limited by: orthopedic condition(s);respiratory conditions(s)  Goals Addressed            This Visit's Progress   . Exercise 3x per week (30 min per time)   Not on track    02/23/2020 AWV Goal: Exercise for General Health   Patient will verbalize understanding of the benefits of increased physical activity:  Exercising regularly is important. It will improve your overall fitness, flexibility, and endurance.  Regular exercise also will improve your overall health. It can help you control your weight, reduce stress, and improve your bone density.  Over the next year, patient will increase physical activity as tolerated with a goal of at least 150 minutes of moderate physical activity per week.   You can tell that you are exercising at a moderate intensity if your heart starts beating faster and you start breathing faster but can still hold a conversation.  Moderate-intensity exercise ideas include:  Walking 1 mile (1.6 km) in about 15 minutes  Biking  Hiking  Golfing  Dancing  Water aerobics  Patient will verbalize understanding of everyday activities that increase physical activity by providing examples like the following: ? Yard work, such as: ? Pushing a Conservation officer, nature ? Raking and bagging leaves ? Washing your car ? Pushing a  stroller ? Shoveling snow ? Gardening ? Washing windows or floors  Patient will be able to explain general safety guidelines for exercising:   Before you start a new exercise program, talk with your health care provider.  Do not exercise so much that you hurt yourself, feel dizzy, or get very short of breath.  Wear comfortable clothes  and wear shoes with good support.  Drink plenty of water while you exercise to prevent dehydration or heat stroke.  Work out until your breathing and your heartbeat get faster.     . Quit Smoking   Not on track    02/23/2020 AWV Goal: Tobacco Cessation  Smoking cessation instruction/counseling given:  counseled patient on the dangers of tobacco use, advised patient to stop smoking, and reviewed strategies to maximize success   Patient will verbalize understanding of the health risks associated with smoking/tobacco use  Lung cancer or lung disease, such as COPD  Heart disease.  Stroke.  Heart attack  Infertility  Osteoporosis and bone fractures. . Patient will create a plan to quit smoking/using tobacco  Pick a date to quit.   Write down the reasons why you are quitting and put it where you will see it often.  Identify the people, places, things, and activities that make you want to smoke (triggers) and avoid them. Make sure to take these actions: ? Throw away all cigarettes at home, at work, and in your car. ? Throw away smoking accessories, such as Scientist, research (medical). ? Clean your car and make sure to empty the ashtray. ? Clean your home, including curtains and carpets.  Tell your family, friends, and coworkers that you are quitting. Support from your loved ones can make quitting easier.  Talk with your health care provider about your options for quitting smoking.  Find out what treatment options are covered by your health insurance. . Patient will be able to demonstrate knowledge of tobacco cessation strategies that may maximize  success  Quitting "cold [place]" is more successful than gradually quitting.  Attending in-person counseling to help you build problem-solving skills.   Finding resources and support systems that can help you to quit smoking and remain smoke-free after you quit. These resources are most helpful when you use them often. They can include: ? Online chats with a Social worker. ? Telephone quitlines. ? Careers information officer. ? Support groups or group counseling. ? Text messaging programs. ? Mobile phone applications.  Taking medicines to help you quit smoking: ? Nicotine patches, gum, or lozenges. ? Nicotine inhalers or sprays. ? Non-nicotine medicine that is taken by mouth. . Patient will note get discouraged if the process is difficult . Over the next year, patient will stop smoking or using other forms of tobacco  Smoking cessation instruction/counseling given:  counseled patient on the dangers of tobacco use, advised patient to stop smoking, and reviewed strategies to maximize success        Depression Screen PHQ 2/9 Scores 02/26/2021 12/05/2020 09/05/2020 06/12/2020 02/29/2020 02/23/2020 01/05/2020  PHQ - 2 Score 0 0 0 0 3 4 1   PHQ- 9 Score - - - - 9 18 3     Fall Risk Fall Risk  02/26/2021 12/05/2020 09/05/2020 06/12/2020 02/29/2020  Falls in the past year? 0 0 0 0 0  Number falls in past yr: 0 - - - 0  Injury with Fall? 0 - - - 0  Risk for fall due to : Orthopedic patient;Impaired vision - - - No Fall Risks  Follow up Falls prevention discussed - Falls evaluation completed Falls evaluation completed Falls evaluation completed    FALL RISK PREVENTION PERTAINING TO THE HOME:  Any stairs in or around the home? Yes  If so, are there any without handrails? No  Home free of loose throw rugs in walkways, pet beds, electrical cords, etc? Yes  Adequate lighting in  your home to reduce risk of falls? Yes   ASSISTIVE DEVICES UTILIZED TO PREVENT FALLS:  Life alert? No  Use of a cane, walker or  w/c? No  Grab bars in the bathroom? Yes  Shower chair or bench in shower? No  Elevated toilet seat or a handicapped toilet? No   TIMED UP AND GO:  Was the test performed? No . Telephonic visit.  Cognitive Function: Normal cognitive status assessed by direct observation by this Nurse Health Advisor. No abnormalities found.      6CIT Screen 02/23/2020 02/16/2019  What Year? 0 points 0 points  What month? 0 points 0 points  What time? 0 points 0 points  Count back from 20 0 points 0 points  Months in reverse 0 points 0 points  Repeat phrase 0 points 0 points  Total Score 0 0    Immunizations Immunization History  Administered Date(s) Administered  . Fluad Quad(high Dose 65+) 07/20/2019, 06/12/2020  . Influenza Whole 06/20/2015  . Influenza, High Dose Seasonal PF 06/20/2016, 07/22/2018  . Influenza,inj,Quad PF,6+ Mos 08/09/2014, 08/04/2017  . Moderna Sars-Covid-2 Vaccination 11/23/2019, 12/21/2019, 10/09/2020  . Pneumococcal Conjugate-13 12/06/2013  . Pneumococcal Polysaccharide-23 11/19/2011  . Td 12/03/2017    TDAP status: Up to date  Flu Vaccine status: Up to date  Pneumococcal vaccine status: Up to date  Covid-19 vaccine status: Completed vaccines  Qualifies for Shingles Vaccine? Yes   Zostavax completed Yes   Shingrix Completed?: No.    Education has been provided regarding the importance of this vaccine. Patient has been advised to call insurance company to determine out of pocket expense if they have not yet received this vaccine. Advised may also receive vaccine at local pharmacy or Health Dept. Verbalized acceptance and understanding.  Screening Tests Health Maintenance  Topic Date Due  . Zoster Vaccines- Shingrix (1 of 2) Never done  . COLONOSCOPY (Pts 45-53yrs Insurance coverage will need to be confirmed)  11/19/2018  . INFLUENZA VACCINE  04/29/2021  . TETANUS/TDAP  12/04/2027  . COVID-19 Vaccine  Completed  . Hepatitis C Screening  Completed  . PNA vac  Low Risk Adult  Completed  . HPV VACCINES  Aged Out    Health Maintenance  Health Maintenance Due  Topic Date Due  . Zoster Vaccines- Shingrix (1 of 2) Never done  . COLONOSCOPY (Pts 45-38yrs Insurance coverage will need to be confirmed)  11/19/2018    Colorectal cancer screening: Type of screening: Colonoscopy. Completed 11/19/2017. Repeat every 1 years  Lung Cancer Screening: (Low Dose CT Chest recommended if Age 68-80 years, 30 pack-year currently smoking OR have quit w/in 15years.) does qualify.   Lung Cancer Screening Referral:  Declined - may discuss with Dr Livia Snellen at next visit.  Additional Screening:  Hepatitis C Screening: does qualify; Completed 03/16/2017  Vision Screening: Recommended annual ophthalmology exams for early detection of glaucoma and other disorders of the eye. Is the patient up to date with their annual eye exam?  No  Who is the provider or what is the name of the office in which the patient attends annual eye exams? Dr Marin Comment in [place] If pt is not established with a provider, would they like to be referred to a provider to establish care? No .   Dental Screening: Recommended annual dental exams for proper oral hygiene  Community Resource Referral / Chronic Care Management: CRR required this visit?  No   CCM required this visit?  No      Plan:  I have personally reviewed and noted the following in the patient's chart:   . Medical and social history . Use of alcohol, tobacco or illicit drugs  . Current medications and supplements including opioid prescriptions. Patient is currently taking opioid prescriptions. Information provided to patient regarding non-opioid alternatives. Patient advised to discuss non-opioid treatment plan with their provider. . Functional ability and status . Nutritional status . Physical activity . Advanced directives . List of other physicians . Hospitalizations, surgeries, and ER visits in previous 12  months . Vitals . Screenings to include cognitive, depression, and falls . Referrals and appointments  In addition, I have reviewed and discussed with patient certain preventive protocols, quality metrics, and best practice recommendations. A written personalized care plan for preventive services as well as general preventive health recommendations were provided to patient.     Sandrea Hammond, LPN   4/79/9800   Nurse Notes: Will wait to discuss Chest CT lung cancer screening and shingrix with Dr Livia Snellen

## 2021-02-26 NOTE — Patient Instructions (Addendum)
Joshua Bowman , Thank you for taking time to come for your Medicare Wellness Visit. I appreciate your ongoing commitment to your health goals. Please review the following plan we discussed and let me know if I can assist you in the future.   Screening recommendations/referrals: Colonoscopy: Done 11/19/2017 - DUE (repeat in a year) postponed due to covid - please call for appointment soon* Recommended yearly ophthalmology/optometry visit for glaucoma screening and checkup Recommended yearly dental visit for hygiene and checkup  Vaccinations: Influenza vaccine: done 06/12/2020 - Repeat annually Pneumococcal vaccine: Done 11/19/2011 & 12/06/2013 Tdap vaccine: Done 12/03/2017 - Repeat in 10 years Shingles vaccine: Due Shingrix discussed. Please contact your pharmacy for coverage information.    Covid-19: Done 11/23/19, 12/21/19, 10/09/2020  Advanced directives: Advance directive discussed with you today. I have provided a copy for you to complete at home and have notarized. Once this is complete please bring a copy in to our office so we can scan it into your chart.  Conditions/risks identified: Aim for 30 minutes of exercise or brisk walking (as tolerated) each day, drink 6-8 glasses of water and eat lots of fruits and vegetables.  Next appointment: Follow up in one year for your annual wellness visit.   Preventive Care 75 Years and Older, Male  Preventive care refers to lifestyle choices and visits with your health care provider that can promote health and wellness. What does preventive care include?  A yearly physical exam. This is also called an annual well check.  Dental exams once or twice a year.  Routine eye exams. Ask your health care provider how often you should have your eyes checked.  Personal lifestyle choices, including:  Daily care of your teeth and gums.  Regular physical activity.  Eating a healthy diet.  Avoiding tobacco and drug use.  Limiting alcohol use.  Practicing  safe sex.  Taking low doses of aspirin every day.  Taking vitamin and mineral supplements as recommended by your health care provider. What happens during an annual well check? The services and screenings done by your health care provider during your annual well check will depend on your age, overall health, lifestyle risk factors, and family history of disease. Counseling  Your health care provider may ask you questions about your:  Alcohol use.  Tobacco use.  Drug use.  Emotional well-being.  Home and relationship well-being.  Sexual activity.  Eating habits.  History of falls.  Memory and ability to understand (cognition).  Work and work Statistician. Screening  You may have the following tests or measurements:  Height, weight, and BMI.  Blood pressure.  Lipid and cholesterol levels. These may be checked every 5 years, or more frequently if you are over 71 years old.  Skin check.  Lung cancer screening. You may have this screening every year starting at age 75 if you have a 30-pack-year history of smoking and currently smoke or have quit within the past 15 years.  Fecal occult blood test (FOBT) of the stool. You may have this test every year starting at age 75.  Flexible sigmoidoscopy or colonoscopy. You may have a sigmoidoscopy every 5 years or a colonoscopy every 10 years starting at age 75.  Prostate cancer screening. Recommendations will vary depending on your family history and other risks.  Hepatitis C blood test.  Hepatitis B blood test.  Sexually transmitted disease (STD) testing.  Diabetes screening. This is done by checking your blood sugar (glucose) after you have not eaten for a while (  fasting). You may have this done every 1-3 years.  Abdominal aortic aneurysm (AAA) screening. You may need this if you are a current or former smoker.  Osteoporosis. You may be screened starting at age 75 if you are at high risk. Talk with your health care  provider about your test results, treatment options, and if necessary, the need for more tests. Vaccines  Your health care provider may recommend certain vaccines, such as:  Influenza vaccine. This is recommended every year.  Tetanus, diphtheria, and acellular pertussis (Tdap, Td) vaccine. You may need a Td booster every 10 years.  Zoster vaccine. You may need this after age 75.  Pneumococcal 13-valent conjugate (PCV13) vaccine. One dose is recommended after age 75.  Pneumococcal polysaccharide (PPSV23) vaccine. One dose is recommended after age 75. Talk to your health care provider about which screenings and vaccines you need and how often you need them. This information is not intended to replace advice given to you by your health care provider. Make sure you discuss any questions you have with your health care provider. Document Released: 10/12/2015 Document Revised: 06/04/2016 Document Reviewed: 07/17/2015 Elsevier Interactive Patient Education  2017 [employer] Prevention in the Home Falls can cause injuries. They can happen to people of all ages. There are many things you can do to make your home safe and to help prevent falls. What can I do on the outside of my home?  Regularly fix the edges of walkways and driveways and fix any cracks.  Remove anything that might make you trip as you walk through a door, such as a raised step or threshold.  Trim any bushes or trees on the path to your home.  Use bright outdoor lighting.  Clear any walking paths of anything that might make someone trip, such as rocks or tools.  Regularly check to see if handrails are loose or broken. Make sure that both sides of any steps have handrails.  Any raised decks and porches should have guardrails on the edges.  Have any leaves, snow, or ice cleared regularly.  Use sand or salt on walking paths during winter.  Clean up any spills in your garage right away. This includes oil or grease  spills. What can I do in the bathroom?  Use night lights.  Install grab bars by the toilet and in the tub and shower. Do not use towel bars as grab bars.  Use non-skid mats or decals in the tub or shower.  If you need to sit down in the shower, use a plastic, non-slip stool.  Keep the floor dry. Clean up any water that spills on the floor as soon as it happens.  Remove soap buildup in the tub or shower regularly.  Attach bath mats securely with double-sided non-slip rug tape.  Do not have throw rugs and other things on the floor that can make you trip. What can I do in the bedroom?  Use night lights.  Make sure that you have a light by your bed that is easy to reach.  Do not use any sheets or blankets that are too big for your bed. They should not hang down onto the floor.  Have a firm chair that has side arms. You can use this for support while you get dressed.  Do not have throw rugs and other things on the floor that can make you trip. What can I do in the kitchen?  Clean up any spills right away.  Avoid  walking on wet floors.  Keep items that you use a lot in easy-to-reach places.  If you need to reach something above you, use a strong step stool that has a grab bar.  Keep electrical cords out of the way.  Do not use floor polish or wax that makes floors slippery. If you must use wax, use non-skid floor wax.  Do not have throw rugs and other things on the floor that can make you trip. What can I do with my stairs?  Do not leave any items on the stairs.  Make sure that there are handrails on both sides of the stairs and use them. Fix handrails that are broken or loose. Make sure that handrails are as long as the stairways.  Check any carpeting to make sure that it is firmly attached to the stairs. Fix any carpet that is loose or worn.  Avoid having throw rugs at the top or bottom of the stairs. If you do have throw rugs, attach them to the floor with carpet  tape.  Make sure that you have a light switch at the top of the stairs and the bottom of the stairs. If you do not have them, ask someone to add them for you. What else can I do to help prevent falls?  Wear shoes that:  Do not have high heels.  Have rubber bottoms.  Are comfortable and fit you well.  Are closed at the toe. Do not wear sandals.  If you use a stepladder:  Make sure that it is fully opened. Do not climb a closed stepladder.  Make sure that both sides of the stepladder are locked into place.  Ask someone to hold it for you, if possible.  Clearly mark and make sure that you can see:  Any grab bars or handrails.  First and last steps.  Where the edge of each step is.  Use tools that help you move around (mobility aids) if they are needed. These include:  Canes.  Walkers.  Scooters.  Crutches.  Turn on the lights when you go into a dark area. Replace any light bulbs as soon as they burn out.  Set up your furniture so you have a clear path. Avoid moving your furniture around.  If any of your floors are uneven, fix them.  If there are any pets around you, be aware of where they are.  Review your medicines with your doctor. Some medicines can make you feel dizzy. This can increase your chance of falling. Ask your doctor what other things that you can do to help prevent falls. This information is not intended to replace advice given to you by your health care provider. Make sure you discuss any questions you have with your health care provider. Document Released: 07/12/2009 Document Revised: 02/21/2016 Document Reviewed: 10/20/2014 Elsevier Interactive Patient Education  2017 [employer]   Managing Pain Without Opioids Opioids are strong medicines used to Bowman moderate to severe pain. For some people, especially those who have long-term (chronic) pain, opioids may not be the best choice for pain management due to:  Side effects like nausea,  constipation, and sleepiness.  The risk of addiction (opioid use disorder). The longer you take opioids, the greater your risk of addiction. Pain that lasts for more than 3 months is called chronic pain. Managing chronic pain usually requires more than one approach and is often provided by a team of health care providers working together (multidisciplinary approach). Pain management may be  done at a pain management center or pain clinic. Types of pain management without opioids Managing pain without opioids can involve:  Non-opioid medicines.  Exercises to help relieve pain and improve strength and range of motion (physical therapy).  Therapy to help with everyday tasks and activities (occupational therapy).  Therapy to help you find ways to relieve pain by doing things you enjoy (recreational therapy).  Talk therapy (psychotherapy) and other mental health therapies.  Medical treatments such as injections or devices.  Making lifestyle changes. Pain management options Non-opioid medicines Non-opioid medicines for pain may include medicines taken by mouth (oral medicines), such as:  Over-the-counter or prescription NSAIDs. These may be the first medicines used for pain. They work well for muscle and bone pain, and they reduce swelling.  Acetaminophen. This over-the-counter medicine may work well for milder pain but not swelling.  Antidepressants. These may be used to Bowman chronic pain. A certain type of antidepressant (tricyclics) is often used. These medicines are given in lower doses for pain than when used for depression.  Anticonvulsants. These are usually used to Bowman seizures but may also reduce nerve (neuropathic) pain.  Muscle relaxants. These relieve pain caused by sudden muscle tightening (spasms). You may also use a type of pain medicine that is applied to the skin as a patch, cream, or gel (topical analgesic), such as a numbing medicine. These may cause fewer side effects  than oral medicines. Therapy Physical therapy involves doing exercises to gain strength and flexibility. A physical therapist may teach you exercises to move and stretch parts of your body that are weak, stiff, or painful. You can learn these exercises at physical therapy visits and practice them at home. Physical therapy may also involve:  Massage.  Heat wraps or applying heat or cold to affected areas.  Sending electrical signals through the skin to interrupt pain signals (transcutaneous electrical nerve stimulation, TENS).  Sending weak lasers through the skin to reduce pain and swelling (low-level laser therapy).  Using signals from your body to help you learn to regulate pain (biofeedback). Occupational therapy helps you learn ways to function at home and work with less pain. Recreational therapy may involve trying new activities or hobbies, such as drawing or a physical activity. Types of mental health therapy for pain include:  Cognitive behavioral therapy (CBT) to help you learn coping skills for dealing with pain.  Acceptance and commitment therapy (ACT) to change the way you think and react to pain.  Relaxation therapies, including muscle relaxation exercises and focusing your mind on the present moment to lower stress (mindfulness-based stress reduction).  Pain management counseling. This may be individual, family, or group counseling.   Medical treatments Medical treatments for pain management include:  Nerve block injections. These may include a pain blocker and anti-inflammatory medicines. You may have injections: ? Near the spine to relieve chronic back or neck pain. ? Into joints to relieve back or joint pain. ? Into nerve areas that supply a painful area to relieve body pain. ? Into muscles (trigger point injections) to relieve some painful muscle conditions.  A medical device placed near your spine to help block pain signals and relieve nerve pain or chronic back  pain (spinal cord stimulation device).  Acupuncture. Follow these instructions at home Medicines  Take over-the-counter and prescription medicines only as told by your health care provider.  If you are taking pain medicine, ask your health care providers about possible side effects to watch out for.  Do  not drive or use heavy machinery while taking prescription pain medicine. Lifestyle  Do not use drugs or alcohol to reduce pain. Limit alcohol intake to no more than 1 drink a day for nonpregnant women and 2 drinks a day for men. One drink equals 12 oz of beer, 5 oz of wine, or 1 oz of hard liquor.  Do not use any products that contain nicotine or tobacco, such as cigarettes and e-cigarettes. These can delay healing. If you need help quitting, ask your health care provider.  Eat a healthy diet and maintain a healthy weight. Poor diet and excess weight may make pain worse. ? Eat foods that are high in fiber. These include fresh fruits and vegetables, whole grains, and beans. ? Limit foods that are high in fat and processed sugars, such as fried and sweet foods.  Exercise regularly. Exercise lowers stress and may help relieve pain. ? Ask your health care provider what activities and exercises are safe for you. ? If your health care provider approves, join an exercise class that combines movement and stress reduction. Examples include yoga and tai chi.  Get enough sleep. Lack of sleep may make pain worse.  Lower stress as much as possible. Practice stress reduction techniques as told by your therapist.   General instructions  Work with all your pain management providers to find the treatments that work best for you. You are an important member of your pain management team. There are many things you can do to reduce pain on your own.  Consider joining an online or in-person support group for people who have chronic pain.  Keep all follow-up visits as told by your health care providers.  This is important. Where to find more information You can find more information about managing pain without opioids from:  American Academy of Pain Medicine: painmed.[place] for Chronic Pain: instituteforchronicpain.org  American Chronic Pain Association: theacpa.org Contact a health care provider if:  You have side effects from pain medicine.  Your pain gets worse or does not get better with treatments or home care.  You are struggling with anxiety or depression. Summary  Many types of pain can be managed without opioids. Chronic pain may respond better to pain management without opioids.  Pain is best managed with a team of providers working together.  Pain management without opioids may include non-opioid medicines, medical treatments, physical therapy, mental health therapy, and lifestyle changes.  Tell your health care providers if your pain gets worse or is not being managed well enough. This information is not intended to replace advice given to you by your health care provider. Make sure you discuss any questions you have with your health care provider. Document Revised: 06/28/2020 Document Reviewed: 06/28/2020 Elsevier Patient Education  [place].

## 2021-03-08 ENCOUNTER — Other Ambulatory Visit: Payer: Self-pay

## 2021-03-08 ENCOUNTER — Ambulatory Visit (INDEPENDENT_AMBULATORY_CARE_PROVIDER_SITE_OTHER): Payer: Medicare Other | Admitting: Family Medicine

## 2021-03-08 ENCOUNTER — Encounter: Payer: Self-pay | Admitting: Family Medicine

## 2021-03-08 VITALS — BP 123/76 | HR 93 | Temp 99.3°F | Ht 66.0 in | Wt 169.0 lb

## 2021-03-08 DIAGNOSIS — N3943 Post-void dribbling: Secondary | ICD-10-CM

## 2021-03-08 DIAGNOSIS — Z79899 Other long term (current) drug therapy: Secondary | ICD-10-CM

## 2021-03-08 DIAGNOSIS — J418 Mixed simple and mucopurulent chronic bronchitis: Secondary | ICD-10-CM

## 2021-03-08 DIAGNOSIS — N401 Enlarged prostate with lower urinary tract symptoms: Secondary | ICD-10-CM

## 2021-03-08 DIAGNOSIS — I10 Essential (primary) hypertension: Secondary | ICD-10-CM | POA: Diagnosis not present

## 2021-03-08 DIAGNOSIS — M5126 Other intervertebral disc displacement, lumbar region: Secondary | ICD-10-CM | POA: Diagnosis not present

## 2021-03-08 DIAGNOSIS — F411 Generalized anxiety disorder: Secondary | ICD-10-CM | POA: Diagnosis not present

## 2021-03-08 MED ORDER — HYDROCODONE-ACETAMINOPHEN 5-325 MG PO TABS
0.5000 | ORAL_TABLET | Freq: Four times a day (QID) | ORAL | 0 refills | Status: DC | PRN
Start: 2021-04-07 — End: 2021-03-08

## 2021-03-08 MED ORDER — HYDROCODONE-ACETAMINOPHEN 5-325 MG PO TABS: 0.5000 | ORAL_TABLET | Freq: Four times a day (QID) | ORAL | 0 refills | Status: DC | PRN

## 2021-03-08 MED ORDER — HYDROCODONE-ACETAMINOPHEN 5-325 MG PO TABS
0.5000 | ORAL_TABLET | Freq: Four times a day (QID) | ORAL | 0 refills | Status: DC | PRN
Start: 2021-05-07 — End: 2021-03-08

## 2021-03-08 NOTE — Progress Notes (Signed)
Subjective:  Patient ID: Joshua Bowman, male    DOB: 07-Nov-1945  Age: 75 y.o. MRN: [id number]  CC: Medical Management of Chronic Issues   HPI GRACIN MCPARTLAND presents for pain management. 5-6/10  Doing yardwork and occasionally overdoes it and pain flares. Taking hydrocodone 1/2 QID with adequate relief. PDMP shows no discrepancy in filling history. Denies side effects. Uses it for chronic lumbar radicular pain.  Continues to have continues to use tamsulosin for prostate hypertrophy.Still nocturia noted.    presents for  follow-up of hypertension. Patient has no history of headache chest pain or shortness of breath or recent cough. Patient also denies symptoms of TIA such as focal numbness or weakness. Patient denies side effects from medication. States taking it regularly.  Some dyspnea noted. Using inhaler intermittently  Depression screen [facility] 2/9 03/08/2021 03/08/2021 02/26/2021  Decreased Interest 1 0 0  Down, Depressed, Hopeless 1 0 0  PHQ - 2 Score 2 0 0  Altered sleeping 0 - -  Tired, decreased energy 1 - -  Change in appetite 0 - -  Feeling bad or failure about yourself  0 - -  Trouble concentrating 0 - -  Moving slowly or fidgety/restless 0 - -  Suicidal thoughts 0 - -  PHQ-9 Score 3 - -  Difficult doing work/chores Not difficult at all - -  Some recent data might be hidden    History Audrick has a past medical history of Anxiety, Arthritis, Bilateral carotid artery stenosis, Colon polyps - attenuated polyposis syndrome, COPD (chronic obstructive pulmonary disease) ([place]), Duodenitis, Gastritis, Hemorrhoids, Hyperlipidemia, Hyperplasia of prostate, IBS (irritable bowel syndrome), Pre-diabetes, and Sigmoid diverticulosis.   He has a past surgical history that includes Appendectomy (1969); nasal surgery ( spurs ) (70's); lipoma rt shoulder (01/2001); rt. shoulder -rotator cuff repair (08/2005); Colonoscopy; Esophagogastroduodenoscopy; Tonsillectomy; and Eye surgery.   His family  history includes Arthritis in his sister; Congestive Heart Failure in his father; Heart attack in his brother; Heart disease in his brother and mother; Post-traumatic stress disorder in his father; Prostate cancer in his brother.He reports that he has been smoking cigarettes. He started smoking about 40 years ago. He has a 40.00 pack-year smoking history. He has quit using smokeless tobacco. He reports previous alcohol use. He reports that he does not use drugs.    ROS Review of Systems  Constitutional:  Negative for fever.  Respiratory:  Negative for shortness of breath.   Cardiovascular:  Negative for chest pain.  Genitourinary:  Positive for frequency (flow is better. but still up 3-4 times a night).  Musculoskeletal:  Positive for back pain (tight, weak at lumbar area). Negative for arthralgias.  Skin:  Negative for rash.   Objective:  BP 123/76   Pulse 93   Temp 99.3 F (37.4 C)   Ht 5\' 6"  (1.676 m)   Wt 169 lb (76.7 kg)   SpO2 97%   BMI 27.28 kg/m   BP Readings from Last 3 Encounters:  03/08/21 123/76  12/05/20 129/86  09/05/20 (!) 161/91    Wt Readings from Last 3 Encounters:  03/08/21 169 lb (76.7 kg)  02/26/21 167 lb (75.8 kg)  12/05/20 170 lb 9.6 oz (77.4 kg)     Physical Exam Vitals reviewed.  Constitutional:      Appearance: He is well-developed.  HENT:     Head: Normocephalic and atraumatic.     Right Ear: External ear normal.     Left Ear: External ear normal.  Mouth/Throat:     Pharynx: No oropharyngeal exudate or posterior oropharyngeal erythema.  Eyes:     Pupils: Pupils are equal, round, and reactive to light.  Cardiovascular:     Rate and Rhythm: Normal rate and regular rhythm.     Heart sounds: No murmur heard. Pulmonary:     Effort: No respiratory distress.     Breath sounds: Normal breath sounds.  Musculoskeletal:     Cervical back: Normal range of motion and neck supple.  Neurological:     Mental Status: He is alert and oriented to  person, place, and time.      Assessment & Plan:   Julez was seen today for medical management of chronic issues.  Diagnoses and all orders for this visit:  Lumbar herniated disc  Mixed simple and mucopurulent chronic bronchitis (HCC)  Benign prostatic hyperplasia with post-void dribbling  GAD (generalized anxiety disorder)  Primary hypertension  Controlled substance agreement signed -     Tox[employer]SU[employer] Select 13 (MW), Urine  Other orders -     Discontinue: HYDROcodone-acetaminophen (NORCO/VICODIN) 5-325 MG tablet; Take 0.5 tablets by mouth every 6 (six) hours as needed for moderate pain. -     Discontinue: HYDROcodone-acetaminophen (NORCO/VICODIN) 5-325 MG tablet; Take 0.5 tablets by mouth every 6 (six) hours as needed for moderate pain. -     Discontinue: HYDROcodone-acetaminophen (NORCO/VICODIN) 5-325 MG tablet; Take 0.5 tablets by mouth every 6 (six) hours as needed for moderate pain. -     HYDROcodone-acetaminophen (NORCO/VICODIN) 5-325 MG tablet; Take 0.5 tablets by mouth every 6 (six) hours as needed for moderate pain. -     HYDROcodone-acetaminophen (NORCO/VICODIN) 5-325 MG tablet; Take 0.5 tablets by mouth every 6 (six) hours as needed for moderate pain. -     HYDROcodone-acetaminophen (NORCO/VICODIN) 5-325 MG tablet; Take 0.5 tablets by mouth every 6 (six) hours as needed for moderate pain.      I am having Joshua Bowman maintain his aspirin, Vitamin D3, diclofenac sodium, albuterol, tamsulosin, fexofenadine, DULoxetine, Symbicort, HYDROcodone-acetaminophen, HYDROcodone-acetaminophen, and HYDROcodone-acetaminophen.  Allergies as of 03/08/2021       Reactions   Lipitor [atorvastatin] Other (See Comments)   myalgias   Sulfa Antibiotics Itching, Rash        Medication List        Accurate as of March 08, 2021 11:59 PM. If you have any questions, ask your nurse or doctor.          albuterol 108 (90 Base) MCG/ACT inhaler Commonly known as: VENTOLIN  HFA Inhale 2 puffs into the lungs every 4 (four) hours as needed for wheezing or shortness of breath.   aspirin 325 MG tablet Take 325 mg by mouth every other day.   diclofenac sodium 1 % Gel Commonly known as: Voltaren Apply 4 g topically 2 (two) times daily.   DULoxetine 60 MG capsule Commonly known as: CYMBALTA TAKE 1 CAPSULE BY MOUTH 2 TIMES DAILY.   fexofenadine 180 MG tablet Commonly known as: ALLEGRA Take 1 tablet (180 mg total) by mouth daily. For allergy symptoms   HYDROcodone-acetaminophen 5-325 MG tablet Commonly known as: NORCO/VICODIN Take 0.5 tablets by mouth every 6 (six) hours as needed for moderate pain. What changed: Another medication with the same name was changed. Make sure you understand how and when to take each. Changed by: Claretta Fraise, MD   HYDROcodone-acetaminophen 5-325 MG tablet Commonly known as: NORCO/VICODIN Take 0.5 tablets by mouth every 6 (six) hours as needed for moderate pain. Start taking on:  April 07, 2021 What changed: These instructions start on April 07, 2021. If you are unsure what to do until then, ask your doctor or other care provider. Changed by: Claretta Fraise, MD   HYDROcodone-acetaminophen 5-325 MG tablet Commonly known as: NORCO/VICODIN Take 0.5 tablets by mouth every 6 (six) hours as needed for moderate pain. Start taking on: May 07, 2021 What changed: These instructions start on May 07, 2021. If you are unsure what to do until then, ask your doctor or other care provider. Changed by: Claretta Fraise, MD   Symbicort 160-4.5 MCG/ACT inhaler Generic drug: budesonide-formoterol INHALE 2 PUFFS INTO THE LUNGS 2 TIMES DAILY   tamsulosin 0.4 MG Caps capsule Commonly known as: FLOMAX Take 2 capsules (0.8 mg total) by mouth at bedtime. For urine flow and prostate   Vitamin D3 125 MCG (5000 UT) Caps Take 1 tablet by mouth. 2-3 x week         Follow-up: No follow-ups on file.  Claretta Fraise, M.D.

## 2021-03-11 ENCOUNTER — Encounter: Payer: Self-pay | Admitting: Family Medicine

## 2021-03-16 LAB — TOXASSURE SELECT 13 (MW), URINE

## 2021-06-10 ENCOUNTER — Encounter: Payer: Self-pay | Admitting: Family Medicine

## 2021-06-10 ENCOUNTER — Other Ambulatory Visit: Payer: Self-pay

## 2021-06-10 ENCOUNTER — Ambulatory Visit (INDEPENDENT_AMBULATORY_CARE_PROVIDER_SITE_OTHER): Payer: Medicare Other | Admitting: Family Medicine

## 2021-06-10 VITALS — BP 135/68 | HR 105 | Temp 99.3°F | Ht 66.0 in | Wt 168.2 lb

## 2021-06-10 DIAGNOSIS — M5126 Other intervertebral disc displacement, lumbar region: Secondary | ICD-10-CM | POA: Diagnosis not present

## 2021-06-10 DIAGNOSIS — I10 Essential (primary) hypertension: Secondary | ICD-10-CM

## 2021-06-10 DIAGNOSIS — M25562 Pain in left knee: Secondary | ICD-10-CM

## 2021-06-10 MED ORDER — HYDROCODONE-ACETAMINOPHEN 5-325 MG PO TABS: 0.5000 | ORAL_TABLET | Freq: Four times a day (QID) | ORAL | 0 refills | Status: DC | PRN

## 2021-06-10 MED ORDER — PREDNISONE 10 MG PO TABS: ORAL_TABLET | ORAL | 0 refills | Status: DC

## 2021-06-10 NOTE — Progress Notes (Signed)
Subjective:  Patient ID: Joshua Bowman, male    DOB: December 10, 1945  Age: 75 y.o. MRN: [id number]  CC: Medical Management of Chronic Issues   HPI [name] presents for  follow-up of hypertension. Patient has no history of headache chest pain or shortness of breath or recent cough. Patient also denies symptoms of TIA such as focal numbness or weakness. Patient denies side effects from medication. States taking it regularly.  Mowed extra 3 days ago. Pulled the left knee. Uneven ground. NKI. Swollen ever since. Pain a little better afer rest and ice yesterday Occurs about every 6 mos with increased exertion.  Pain assessment: Cause of pain- lumbar herniated disc with radiculopathy Pain location- midline L4-5 with radiation Pain on scale of 1-10- 8 Frequency- daily What increases pain-activity What makes pain Better-rest Effects on ADL - none Any change in general medical condition-none  Current opioids rx- hydrocodone 5/325 Effectiveness of current meds-good, temporary relief Adverse reactions from pain meds-none Morphine equivalent- 10/day  Pill count performed-No Last drug screen -  ( high risk q1m moderate risk q64mlow risk yearly ) Urine drug screen today- No [place]  If yes were their any concerning findings? - no   Overdose risk:  Opioid Risk  02/29/2020  Alcohol 0  Illegal Drugs 0  Rx Drugs 0  Alcohol 3  Illegal Drugs 0  Rx Drugs 0  Age between 16-45 years  0  History of Preadolescent Sexual Abuse 0  Psychological Disease -  Depression 1  Opioid Risk Tool Scoring 4  Opioid Risk Interpretation Moderate Risk     Pain contract signed on:   History Berley has a past medical history of Anxiety, Arthritis, Bilateral carotid artery stenosis, Colon polyps - attenuated polyposis syndrome, COPD (chronic obstructive pulmonary disease) ([facility] Duodenitis, Gastritis, Hemorrhoids, Hyperlipidemia, Hyperplasia of prostate, IBS (irritable bowel  syndrome), Pre-diabetes, and Sigmoid diverticulosis.   He has a past surgical history that includes Appendectomy (1969); nasal surgery ( spurs ) (70's); lipoma rt shoulder (01/2001); rt. shoulder -rotator cuff repair (08/2005); Colonoscopy; Esophagogastroduodenoscopy; Tonsillectomy; and Eye surgery.   His family history includes Arthritis in his sister; Congestive Heart Failure in his father; Heart attack in his brother; Heart disease in his brother and mother; Post-traumatic stress disorder in his father; Prostate cancer in his brother.He reports that he has been smoking cigarettes. He started smoking about 40 years ago. He has a 40.00 pack-year smoking history. He has quit using smokeless tobacco. He reports that he does not currently use alcohol. He reports that he does not use drugs.  Current Outpatient Medications on File Prior to Visit  Medication Sig Dispense Refill   albuterol (VENTOLIN HFA) 108 (90 Base) MCG/ACT inhaler Inhale 2 puffs into the lungs every 4 (four) hours as needed for wheezing or shortness of breath. 18 g 4   aspirin 325 MG tablet Take 325 mg by mouth every other day.      Cholecalciferol (VITAMIN D3) 5000 UNITS CAPS Take 1 tablet by mouth. 2-3 x week     diclofenac sodium (VOLTAREN) 1 % GEL Apply 4 g topically 2 (two) times daily. 100 g 2   DULoxetine (CYMBALTA) 60 MG capsule TAKE 1 CAPSULE BY MOUTH 2 TIMES DAILY. 180 capsule 0   fexofenadine (ALLEGRA) 180 MG tablet Take 1 tablet (180 mg total) by mouth daily. For allergy symptoms 30 tablet 11   SYMBICORT 160-4.5 MCG/ACT inhaler INHALE 2 PUFFS INTO THE LUNGS 2 TIMES DAILY 10.2  each 3   tamsulosin (FLOMAX) 0.4 MG CAPS capsule Take 2 capsules (0.8 mg total) by mouth at bedtime. For urine flow and prostate 180 capsule 3   No current facility-administered medications on file prior to visit.    ROS Review of Systems  Constitutional:  Negative for fever.  Respiratory:  Negative for shortness of breath.   Cardiovascular:   Negative for chest pain.  Musculoskeletal:  Positive for arthralgias and back pain.  Skin:  Negative for rash.   Objective:  BP 135/68   Pulse (!) 105   Temp 99.3 F (37.4 C)   Ht '5\' 6"'$  (1.676 m)   Wt 168 lb 3.2 oz (76.3 kg)   SpO2 94%   BMI 27.15 kg/m   BP Readings from Last 3 Encounters:  06/10/21 135/68  03/08/21 123/76  12/05/20 129/86    Wt Readings from Last 3 Encounters:  06/10/21 168 lb 3.2 oz (76.3 kg)  03/08/21 169 lb (76.7 kg)  02/26/21 167 lb (75.8 kg)     Physical Exam Vitals reviewed.  Constitutional:      Appearance: He is well-developed.  HENT:     Head: Normocephalic and atraumatic.     Right Ear: External ear normal.     Left Ear: External ear normal.     Mouth/Throat:     Pharynx: No oropharyngeal exudate or posterior oropharyngeal erythema.  Eyes:     Pupils: Pupils are equal, round, and reactive to light.  Cardiovascular:     Rate and Rhythm: Normal rate and regular rhythm.     Heart sounds: No murmur heard. Pulmonary:     Effort: No respiratory distress.     Breath sounds: Normal breath sounds.  Musculoskeletal:        General: Swelling (left suprapatellar bursa swollen) present.     Cervical back: Normal range of motion and neck supple.  Neurological:     Mental Status: He is alert and oriented to person, place, and time.      Assessment & Plan:   Kirkpatrick was seen today for medical management of chronic issues.  Diagnoses and all orders for this visit:  Primary hypertension  Lumbar herniated disc  Acute pain of left knee  Other orders -     HYDROcodone-acetaminophen (NORCO/VICODIN) 5-325 MG tablet; Take 0.5 tablets by mouth every 6 (six) hours as needed for moderate pain. -     HYDROcodone-acetaminophen (NORCO/VICODIN) 5-325 MG tablet; Take 0.5 tablets by mouth every 6 (six) hours as needed for moderate pain. -     HYDROcodone-acetaminophen (NORCO/VICODIN) 5-325 MG tablet; Take 0.5 tablets by mouth every 6 (six) hours as  needed for moderate pain. -     predniSONE (DELTASONE) 10 MG tablet; Take 5 daily for 2 days followed by 4,3,2 and 1 for 2 days each.  Allergies as of 06/10/2021       Reactions   Lipitor [atorvastatin] Other (See Comments)   myalgias   Sulfa Antibiotics Itching, Rash        Medication List        Accurate as of June 10, 2021  3:24 PM. If you have any questions, ask your nurse or doctor.          albuterol 108 (90 Base) MCG/ACT inhaler Commonly known as: VENTOLIN HFA Inhale 2 puffs into the lungs every 4 (four) hours as needed for wheezing or shortness of breath.   aspirin 325 MG tablet Take 325 mg by mouth every other day.   diclofenac  sodium 1 % Gel Commonly known as: Voltaren Apply 4 g topically 2 (two) times daily.   DULoxetine 60 MG capsule Commonly known as: CYMBALTA TAKE 1 CAPSULE BY MOUTH 2 TIMES DAILY.   fexofenadine 180 MG tablet Commonly known as: ALLEGRA Take 1 tablet (180 mg total) by mouth daily. For allergy symptoms   HYDROcodone-acetaminophen 5-325 MG tablet Commonly known as: NORCO/VICODIN Take 0.5 tablets by mouth every 6 (six) hours as needed for moderate pain. What changed: Another medication with the same name was changed. Make sure you understand how and when to take each. Changed by: Claretta Fraise, MD   HYDROcodone-acetaminophen 5-325 MG tablet Commonly known as: NORCO/VICODIN Take 0.5 tablets by mouth every 6 (six) hours as needed for moderate pain. Start taking on: July 10, 2021 What changed: These instructions start on July 10, 2021. If you are unsure what to do until then, ask your doctor or other care provider. Changed by: Claretta Fraise, MD   HYDROcodone-acetaminophen 5-325 MG tablet Commonly known as: NORCO/VICODIN Take 0.5 tablets by mouth every 6 (six) hours as needed for moderate pain. Start taking on: August 09, 2021 What changed: These instructions start on August 09, 2021. If you are unsure what to do until  then, ask your doctor or other care provider. Changed by: Claretta Fraise, MD   predniSONE 10 MG tablet Commonly known as: DELTASONE Take 5 daily for 2 days followed by 4,3,2 and 1 for 2 days each. Started by: Claretta Fraise, MD   Symbicort 160-4.5 MCG/ACT inhaler Generic drug: budesonide-formoterol INHALE 2 PUFFS INTO THE LUNGS 2 TIMES DAILY   tamsulosin 0.4 MG Caps capsule Commonly known as: FLOMAX Take 2 capsules (0.8 mg total) by mouth at bedtime. For urine flow and prostate   Vitamin D3 125 MCG (5000 UT) Caps Take 1 tablet by mouth. 2-3 x week        Meds ordered this encounter  Medications   HYDROcodone-acetaminophen (NORCO/VICODIN) 5-325 MG tablet    Sig: Take 0.5 tablets by mouth every 6 (six) hours as needed for moderate pain.    Dispense:  60 tablet    Refill:  0   HYDROcodone-acetaminophen (NORCO/VICODIN) 5-325 MG tablet    Sig: Take 0.5 tablets by mouth every 6 (six) hours as needed for moderate pain.    Dispense:  60 tablet    Refill:  0   HYDROcodone-acetaminophen (NORCO/VICODIN) 5-325 MG tablet    Sig: Take 0.5 tablets by mouth every 6 (six) hours as needed for moderate pain.    Dispense:  60 tablet    Refill:  0   predniSONE (DELTASONE) 10 MG tablet    Sig: Take 5 daily for 2 days followed by 4,3,2 and 1 for 2 days each.    Dispense:  30 tablet    Refill:  0     Rest, ice, brace. Decrease use on uneven surfaces  Follow-up: Return in about 3 months (around 09/09/2021).  Claretta Fraise, M.D.

## 2021-06-16 ENCOUNTER — Other Ambulatory Visit: Payer: Self-pay | Admitting: Family Medicine

## 2021-06-26 ENCOUNTER — Other Ambulatory Visit: Payer: Self-pay | Admitting: Family Medicine

## 2021-08-21 ENCOUNTER — Ambulatory Visit (INDEPENDENT_AMBULATORY_CARE_PROVIDER_SITE_OTHER): Payer: Medicare Other | Admitting: Family Medicine

## 2021-08-21 ENCOUNTER — Encounter: Payer: Self-pay | Admitting: Family Medicine

## 2021-08-21 ENCOUNTER — Other Ambulatory Visit: Payer: Self-pay

## 2021-08-21 VITALS — BP 146/94 | HR 96 | Temp 98.1°F | Ht 66.0 in | Wt 170.0 lb

## 2021-08-21 DIAGNOSIS — M5441 Lumbago with sciatica, right side: Secondary | ICD-10-CM | POA: Diagnosis not present

## 2021-08-21 DIAGNOSIS — G8929 Other chronic pain: Secondary | ICD-10-CM

## 2021-08-21 MED ORDER — PREDNISONE 20 MG PO TABS: ORAL_TABLET | ORAL | 0 refills | Status: DC

## 2021-08-21 MED ORDER — KETOROLAC TROMETHAMINE 60 MG/2ML IM SOLN
60.0000 mg | Freq: Once | INTRAMUSCULAR | Status: AC
Start: 2021-08-21 — End: 2021-08-21
  Administered 2021-08-21: 60 mg via INTRAMUSCULAR

## 2021-08-21 NOTE — Progress Notes (Signed)
Subjective:  Patient ID: Joshua Bowman, male    DOB: [birth year], 75 y.o.   MRN: [id number]  Patient Care Team: Claretta Fraise, MD as PCP - General (Family Medicine) Minus Breeding, MD as Consulting Physician (Cardiology) Susa Day, MD as Consulting Physician (Orthopedic Surgery) Gatha Mayer, MD as Consulting Physician (Gastroenterology) Harlen Labs, MD as Referring Physician (Optometry) Hayden Pedro, MD as Consulting Physician (Ophthalmology)   Chief Complaint:  Back Pain (Spasms, flared up a couple of weeks ago.)   HPI: Joshua Bowman is a 75 y.o. male presenting on 08/21/2021 for Back Pain (Spasms, flared up a couple of weeks ago.)   Pt presents today with worsening back pain over the last 12 days. He has chronic low back pain which he reports acts up at times. He was out in the yard working on the warmer days causing an increase in his lower back pain. He has been using heat, ice, tens unit, and hydrocodone without significant relief of symptoms. Recent renal function reviewed and was normal.   Back Pain This is a chronic problem. The current episode started 1 to 4 weeks ago. The problem has been waxing and waning since onset. The pain is present in the lumbar spine. The quality of the pain is described as aching, burning and shooting. The pain radiates to the right thigh. The pain is at a severity of 6/10. The pain is moderate. The symptoms are aggravated by bending, position, standing, twisting, sitting and stress. Associated symptoms include leg pain and tingling. Pertinent negatives include no abdominal pain, bladder incontinence, bowel incontinence, chest pain, dysuria, fever, headaches, numbness, paresis, paresthesias, pelvic pain, perianal numbness, weakness or weight loss. He has tried heat, ice, home exercises and analgesics for the symptoms. The treatment provided mild relief.    Relevant past medical, surgical, family, and social history reviewed and  updated as indicated.  Allergies and medications reviewed and updated. Data reviewed: Chart in Epic.   Past Medical History:  Diagnosis Date   Anxiety    Arthritis    Bilateral carotid artery stenosis    Minimal 2012    Colon polyps - attenuated polyposis syndrome    multiple    COPD (chronic obstructive pulmonary disease) (HCC)    Duodenitis    Gastritis    Hemorrhoids    Hyperlipidemia    Hyperplasia of prostate    IBS (irritable bowel syndrome)    Pre-diabetes    Sigmoid diverticulosis    mild     Past Surgical History:  Procedure Laterality Date   APPENDECTOMY  1969   COLONOSCOPY     ESOPHAGOGASTRODUODENOSCOPY     EYE SURGERY     laser - left eye - Dr Zigmund Daniel   lipoma rt shoulder  01/2001   outpatient    nasal surgery ( spurs )  70's   rt. shoulder -rotator cuff repair  08/2005   Dr. 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  Lives with second wife.    Social Determinants of Health   Financial Resource Strain: Low Risk    Difficulty of Paying Living Expenses: Not hard at all  Food Insecurity: No Food Insecurity   Worried About Charity fundraiser in the Last Year: Never true   [place] in the Last Year: Never true  Transportation Needs: No  Transportation Needs   Lack of Transportation (Medical): No   Lack of Transportation (Non-Medical): No  Physical Activity: Insufficiently Active   Days of Exercise per Week: 4 days   Minutes of Exercise per Session: 20 min  Stress: No Stress Concern Present   Feeling of Stress : Only a little  Social Connections: Engineer, building services of Communication with Friends and Family: More than three times a week   Frequency of Social Gatherings with Friends and Family: More than three times a week   Attends Religious Services: More than 4 times per year   Active Member of [employer] or Organizations: Yes   Attends Music therapist: More than 4 times per year   Marital Status: Married  Human resources officer Violence: Not At Risk   Fear of Current or Ex-Partner: No   Emotionally Abused: No   Physically Abused: No   Sexually Abused: No    Outpatient Encounter Medications as of 08/21/2021  Medication Sig   albuterol (VENTOLIN HFA) 108 (90 Base) MCG/ACT inhaler Inhale 2 puffs into the lungs every 4 (four) hours as needed for wheezing or shortness of breath.   aspirin 325 MG tablet Take 325 mg by mouth every other day.    Cholecalciferol (VITAMIN D3) 5000 UNITS CAPS Take 1 tablet by mouth. 2-3 x week   diclofenac sodium (VOLTAREN) 1 % GEL Apply 4 g topically 2 (two) times daily.   DULoxetine (CYMBALTA) 60 MG capsule TAKE 1 CAPSULE BY MOUTH TWICE A DAY   fexofenadine (ALLEGRA) 180 MG tablet Take 1 tablet (180 mg total) by mouth daily. For allergy symptoms   HYDROcodone-acetaminophen (NORCO/VICODIN) 5-325 MG tablet Take 0.5 tablets by mouth every 6 (six) hours as needed for moderate pain.   HYDROcodone-acetaminophen (NORCO/VICODIN) 5-325 MG tablet Take 0.5 tablets by mouth every 6 (six) hours as needed for moderate pain.   HYDROcodone-acetaminophen (NORCO/VICODIN) 5-325 MG tablet Take 0.5 tablets by mouth every 6 (six) hours as needed for moderate pain.   predniSONE (DELTASONE) 20 MG  tablet 2 po at sametime daily for 5 days- start tomorrow   SYMBICORT 160-4.5 MCG/ACT inhaler INHALE 2 PUFFS INTO THE LUNGS TWICE A DAY   tamsulosin (FLOMAX) 0.4 MG CAPS capsule Take 2 capsules (0.8 mg total) by mouth at bedtime. For urine flow and prostate   [DISCONTINUED] predniSONE (DELTASONE) 10 MG tablet Take 5 daily for 2 days followed by 4,3,2 and 1 for 2 days each. (Patient not taking: Reported on 08/21/2021)   No facility-administered encounter medications on file as of 08/21/2021.    Allergies  Allergen Reactions   Lipitor [Atorvastatin] Other (See Comments)    myalgias   Sulfa Antibiotics Itching and Rash    Review of Systems  Constitutional:  Positive for activity change. Negative for appetite change, chills, diaphoresis, fatigue, fever, unexpected weight change and weight loss.  Cardiovascular:  Negative for chest pain.  Gastrointestinal:  Negative for abdominal pain and bowel incontinence.  Genitourinary:  Negative for bladder incontinence, decreased urine volume, difficulty urinating, dysuria and pelvic pain.  Musculoskeletal:  Positive for arthralgias, back pain, gait problem and myalgias. Negative for joint swelling, neck pain and neck stiffness.  Neurological:  Positive for tingling. Negative for dizziness, tremors, seizures, syncope, facial asymmetry, speech difficulty, weakness, light-headedness, numbness, headaches and paresthesias.  Psychiatric/Behavioral:  Negative for confusion.   All other systems reviewed and are negative.      Objective:  BP (!) 146/94   Pulse 96   Temp 98.1 F (36.7 C)   Ht 5\' 6"  (1.676 m)   Wt 170 lb (77.1 kg)   SpO2 99%   BMI 27.44 kg/m    Wt Readings from Last 3 Encounters:  08/21/21 170 lb (77.1 kg)  06/10/21 168 lb 3.2 oz (76.3 kg)  03/08/21 169 lb (76.7 kg)    Physical Exam Vitals and nursing note reviewed.  Constitutional:      General: He is not in acute distress.    Appearance: Normal appearance. He is normal weight.  He is not ill-appearing, toxic-appearing or diaphoretic.  HENT:     Head: Normocephalic and atraumatic.     Nose: Nose normal.     Mouth/Throat:     Mouth: Mucous membranes are moist.  Eyes:     Conjunctiva/sclera: Conjunctivae normal.     Pupils: Pupils are equal, round, and reactive to light.  Cardiovascular:     Rate and Rhythm: Normal rate and regular rhythm.     Heart sounds: Normal heart sounds.  Pulmonary:     Effort: Pulmonary effort is normal.     Breath sounds: Normal breath sounds.  Musculoskeletal:     Cervical back: Normal.     Thoracic back: Normal.     Lumbar back: Spasms and tenderness present. No swelling, edema, deformity, signs of trauma, lacerations or bony tenderness. Decreased range of motion. Positive right straight leg raise test. Negative left straight leg raise test. No scoliosis.       Back:     Right hip: Normal.     Left hip: Normal.  Skin:    General: Skin is warm and dry.     Capillary Refill: Capillary refill takes less than 2 seconds.  Neurological:     General: No focal deficit present.     Mental Status: He is alert and oriented to person, place, and time.     Cranial Nerves: No cranial nerve deficit.     Motor: No weakness.     Gait: Gait abnormal (antalgic, using walking stick).  Psychiatric:        Mood and Affect: Mood normal.        Behavior: Behavior normal.        Thought Content: Thought content normal.        Judgment: Judgment normal.    Results for orders placed or performed in visit on 03/08/21  ToxASSURE Select 13 (MW), Urine  Result Value Ref Range   Summary Note        Pertinent labs & imaging results that were available during my care of the patient were reviewed by me and considered in my medical decision making.  Assessment & Plan:  Dammon was seen today for back pain.  Diagnoses and all orders  for this visit:  Chronic midline low back pain with right-sided sciatica Worsening chronic low back pain over the last  12 days.  Reported right-sided sciatica symptoms with positive right straight leg raise test.  No signs or symptoms concerning for cauda equina syndrome.  Due to underlying hypertension will give Toradol injection in office and burst with steroids starting tomorrow.  Symptomatic care discussed in detail.  Patient aware of symptoms which require emergent evaluation.  Report any new, worsening, or persistent symptoms.  Follow-up in 4 weeks for reevaluation. -     predniSONE (DELTASONE) 20 MG tablet; 2 po at sametime daily for 5 days- start tomorrow     Continue all other maintenance medications.  Follow up plan: Return in about 4 weeks (around 09/18/2021), or if symptoms worsen or fail to improve.   Continue healthy lifestyle choices, including diet (rich in fruits, vegetables, and lean proteins, and low in salt and simple carbohydrates) and exercise (at least 30 minutes of moderate physical activity daily).  Educational handout given for chronic back pain  The above assessment and management plan was discussed with the patient. The patient verbalized understanding of and has agreed to the management plan. Patient is aware to call the clinic if they develop any new symptoms or if symptoms persist or worsen. Patient is aware when to return to the clinic for a follow-up visit. Patient educated on when it is appropriate to go to the emergency department.   Monia Pouch, FNP-C [facility] [phone]

## 2021-08-21 NOTE — Addendum Note (Signed)
Addended by: Geryl Rankins D on: 08/21/2021 05:12 PM   Modules accepted: Orders

## 2021-08-28 ENCOUNTER — Ambulatory Visit: Payer: Medicare Other | Admitting: Family Medicine

## 2021-09-05 ENCOUNTER — Other Ambulatory Visit: Payer: Self-pay | Admitting: Family Medicine

## 2021-09-09 ENCOUNTER — Encounter: Payer: Self-pay | Admitting: Family Medicine

## 2021-09-09 ENCOUNTER — Ambulatory Visit (INDEPENDENT_AMBULATORY_CARE_PROVIDER_SITE_OTHER): Payer: Medicare Other

## 2021-09-09 ENCOUNTER — Ambulatory Visit (INDEPENDENT_AMBULATORY_CARE_PROVIDER_SITE_OTHER): Payer: Medicare Other | Admitting: Family Medicine

## 2021-09-09 VITALS — BP 101/76 | HR 97 | Temp 97.7°F | Ht 66.0 in | Wt 168.2 lb

## 2021-09-09 DIAGNOSIS — M5441 Lumbago with sciatica, right side: Secondary | ICD-10-CM

## 2021-09-09 DIAGNOSIS — E7800 Pure hypercholesterolemia, unspecified: Secondary | ICD-10-CM | POA: Diagnosis not present

## 2021-09-09 DIAGNOSIS — J418 Mixed simple and mucopurulent chronic bronchitis: Secondary | ICD-10-CM

## 2021-09-09 DIAGNOSIS — N3943 Post-void dribbling: Secondary | ICD-10-CM

## 2021-09-09 DIAGNOSIS — G8929 Other chronic pain: Secondary | ICD-10-CM

## 2021-09-09 DIAGNOSIS — Z23 Encounter for immunization: Secondary | ICD-10-CM | POA: Diagnosis not present

## 2021-09-09 DIAGNOSIS — M545 Low back pain, unspecified: Secondary | ICD-10-CM | POA: Diagnosis not present

## 2021-09-09 DIAGNOSIS — F411 Generalized anxiety disorder: Secondary | ICD-10-CM

## 2021-09-09 DIAGNOSIS — I10 Essential (primary) hypertension: Secondary | ICD-10-CM | POA: Diagnosis not present

## 2021-09-09 DIAGNOSIS — N401 Enlarged prostate with lower urinary tract symptoms: Secondary | ICD-10-CM

## 2021-09-09 DIAGNOSIS — F339 Major depressive disorder, recurrent, unspecified: Secondary | ICD-10-CM

## 2021-09-09 MED ORDER — PREGABALIN 50 MG PO CAPS: ORAL_CAPSULE | ORAL | 0 refills | Status: DC

## 2021-09-09 MED ORDER — HYDROCODONE-ACETAMINOPHEN 5-325 MG PO TABS: 0.5000 | ORAL_TABLET | Freq: Four times a day (QID) | ORAL | 0 refills | Status: DC | PRN

## 2021-09-09 MED ORDER — BETAMETHASONE SOD PHOS & ACET 6 (3-3) MG/ML IJ SUSP: 6.0000 mg | Freq: Once | INTRAMUSCULAR | Status: DC

## 2021-09-09 MED ORDER — DULOXETINE HCL 60 MG PO CPEP: 60.0000 mg | ORAL_CAPSULE | Freq: Two times a day (BID) | ORAL | 3 refills | Status: DC

## 2021-09-09 NOTE — Progress Notes (Signed)
Subjective:  Patient ID: [name], male    DOB: 1946/03/06  Age: 75 y.o. MRN: [id number]  CC: Medical Management of Chronic Issues   HPI KODAH MARET presents for back pain radiating to the right thigh. Spasm at right buttocks all the time. Some relief with TENS and biofreeze. Moderately severe. Radiating to anterior thigh. INterferes with meaningful activity. REcent shot &  prednisone not helpful.   Brought in several BP readings showing Systolic readings in the 757V & 728A and diastolics under 80  Depression screen [facility] 2/9 09/09/2021 08/21/2021 06/10/2021  Decreased Interest 0 1 1  Down, Depressed, Hopeless 0 0 0  PHQ - 2 Score 0 1 1  Altered sleeping - 1 1  Tired, decreased energy - 1 1  Change in appetite - 0 0  Feeling bad or failure about yourself  - 0 0  Trouble concentrating - 0 0  Moving slowly or fidgety/restless - 0 0  Suicidal thoughts - 0 0  PHQ-9 Score - 3 3  Difficult doing work/chores - Not difficult at all Not difficult at all  Some recent data might be hidden   GAD 7 : Generalized Anxiety Score 08/21/2021 06/10/2021 03/08/2021 02/29/2020  Nervous, Anxious, on Edge _0 Control/stop worrying 0 0 0 2  Worry too much - different things 0 0 1 2  Trouble relaxing _1 Restless 0 0 1 3  Easily annoyed or irritable 0 0 1 1  Afraid - awful might happen 0 0 0 1  Total GAD 7 Score _2 Anxiety Difficulty Not difficult at all Not difficult at all Somewhat difficult Somewhat difficult     History Adain has a past medical history of Anxiety, Arthritis, Bilateral carotid artery stenosis, Colon polyps - attenuated polyposis syndrome, COPD (chronic obstructive pulmonary disease) ([place]), Duodenitis, Gastritis, Hemorrhoids, Hyperlipidemia, Hyperplasia of prostate, IBS (irritable bowel syndrome), Pre-diabetes, and Sigmoid diverticulosis.   He has a past surgical history that includes Appendectomy (1969); nasal surgery ( spurs ) (70's); lipoma rt shoulder  (01/2001); rt. shoulder -rotator cuff repair (08/2005); Colonoscopy; Esophagogastroduodenoscopy; Tonsillectomy; and Eye surgery.   His family history includes Arthritis in his sister; Congestive Heart Failure in his father; Heart attack in his brother; Heart disease in his brother and mother; Post-traumatic stress disorder in his father; Prostate cancer in his brother.He reports that he has been smoking cigarettes. He started smoking about 40 years ago. He has a 40.00 pack-year smoking history. He has quit using smokeless tobacco. He reports that he does not currently use alcohol. He reports that he does not use drugs.    ROS Review of Systems  Constitutional:  Negative for fever.  Respiratory:  Negative for shortness of breath.   Cardiovascular:  Negative for chest pain.  Musculoskeletal:  Positive for back pain. Negative for arthralgias.  Skin:  Negative for rash.   Objective:  BP 101/76   Pulse 97   Temp 97.7 F (36.5 C)   Ht _3  (1.676 m)   Wt 168 lb 3.2 oz (76.3 kg)   SpO2 95%   BMI 27.15 kg/m   BP Readings from Last 3 Encounters:  09/09/21 101/76  08/21/21 (!) 146/94  06/10/21 135/68    Wt Readings from Last 3 Encounters:  09/09/21 168 lb 3.2 oz (76.3 kg)  08/21/21 170 lb (77.1 kg)  06/10/21 168 lb 3.2 oz (76.3 kg)     Physical Exam Vitals reviewed.  Constitutional:      Appearance: He is well-developed.  HENT:     Head: Normocephalic and atraumatic.     Right Ear: External ear normal.     Left Ear: External ear normal.     Mouth/Throat:     Pharynx: No oropharyngeal exudate or posterior oropharyngeal erythema.  Eyes:     Pupils: Pupils are equal, round, and reactive to light.  Cardiovascular:     Rate and Rhythm: Normal rate and regular rhythm.     Heart sounds: No murmur heard. Pulmonary:     Effort: No respiratory distress.     Breath sounds: Normal breath sounds.  Musculoskeletal:     Cervical back: Normal range of motion and neck supple.   Neurological:     Mental Status: He is alert and oriented to person, place, and time.      Assessment & Plan:   Neils was seen today for medical management of chronic issues.  Diagnoses and all orders for this visit:  Primary hypertension -     CBC with Differential/Platelet -     CMP14+EGFR  Pure hypercholesterolemia -     Lipid panel  Benign prostatic hyperplasia with post-void dribbling  Mixed simple and mucopurulent chronic bronchitis (HCC)  Depression, recurrent (HCC)  GAD (generalized anxiety disorder)  Chronic midline low back pain with right-sided sciatica -     DG Lumbar Spine 2-3 Views; Future -     betamethasone acetate-betamethasone sodium phosphate (CELESTONE) injection 6 mg  Other orders -     DULoxetine (CYMBALTA) 60 MG capsule; Take 1 capsule (60 mg total) by mouth 2 (two) times daily. -     HYDROcodone-acetaminophen (NORCO/VICODIN) 5-325 MG tablet; Take 0.5 tablets by mouth every 6 (six) hours as needed for moderate pain. -     HYDROcodone-acetaminophen (NORCO/VICODIN) 5-325 MG tablet; Take 0.5 tablets by mouth every 6 (six) hours as needed for moderate pain. -     HYDROcodone-acetaminophen (NORCO/VICODIN) 5-325 MG tablet; Take 0.5 tablets by mouth every 6 (six) hours as needed for moderate pain. -     pregabalin (LYRICA) 50 MG capsule; 1 qhs X7 days , then 2 qhs X 7d, then 3 qhs X 7d, then 4 qhs      I have discontinued Jovonta W. Faulkner's predniSONE. I have also changed his DULoxetine. Additionally, I am having him start on pregabalin. Lastly, I am having him maintain his aspirin, Vitamin D3, diclofenac sodium, albuterol, fexofenadine, tamsulosin, Symbicort, HYDROcodone-acetaminophen, HYDROcodone-acetaminophen, and HYDROcodone-acetaminophen. We will continue to administer betamethasone acetate-betamethasone sodium phosphate.  Allergies as of 09/09/2021       Reactions   Lipitor [atorvastatin] Other (See Comments)   myalgias   Sulfa Antibiotics  Itching, Rash        Medication List        Accurate as of September 09, 2021  1:26 PM. If you have any questions, ask your nurse or doctor.          STOP taking these medications    predniSONE 20 MG tablet Commonly known as: DELTASONE Stopped by: Claretta Fraise, MD       TAKE these medications    albuterol 108 (90 Base) MCG/ACT inhaler Commonly known as: VENTOLIN HFA Inhale 2 puffs into the lungs every 4 (four) hours as needed for wheezing or shortness of breath.   aspirin 325 MG tablet Take 325 mg by mouth every other day.   diclofenac sodium 1 % Gel Commonly known as: Voltaren Apply 4 g topically  2 (two) times daily.   DULoxetine 60 MG capsule Commonly known as: CYMBALTA Take 1 capsule (60 mg total) by mouth 2 (two) times daily.   fexofenadine 180 MG tablet Commonly known as: ALLEGRA Take 1 tablet (180 mg total) by mouth daily. For allergy symptoms   HYDROcodone-acetaminophen 5-325 MG tablet Commonly known as: NORCO/VICODIN Take 0.5 tablets by mouth every 6 (six) hours as needed for moderate pain. What changed: Another medication with the same name was changed. Make sure you understand how and when to take each. Changed by: Claretta Fraise, MD   HYDROcodone-acetaminophen 5-325 MG tablet Commonly known as: NORCO/VICODIN Take 0.5 tablets by mouth every 6 (six) hours as needed for moderate pain. Start taking on: October 09, 2021 What changed: These instructions start on October 09, 2021. If you are unsure what to do until then, ask your doctor or other care provider. Changed by: Claretta Fraise, MD   HYDROcodone-acetaminophen 5-325 MG tablet Commonly known as: NORCO/VICODIN Take 0.5 tablets by mouth every 6 (six) hours as needed for moderate pain. Start taking on: November 08, 2021 What changed: These instructions start on November 08, 2021. If you are unsure what to do until then, ask your doctor or other care provider. Changed by: Claretta Fraise, MD    pregabalin 50 MG capsule Commonly known as: Lyrica 1 qhs X7 days , then 2 qhs X 7d, then 3 qhs X 7d, then 4 qhs Started by: Claretta Fraise, MD   Symbicort 160-4.5 MCG/ACT inhaler Generic drug: budesonide-formoterol INHALE 2 PUFFS INTO THE LUNGS TWICE A DAY   tamsulosin 0.4 MG Caps capsule Commonly known as: FLOMAX TAKE 2 CAPSULES (0.8 MG TOTAL) BY MOUTH AT BEDTIME. FOR URINE FLOW AND PROSTATE   Vitamin D3 125 MCG (5000 UT) Caps Take 1 tablet by mouth. 2-3 x week         Follow-up: Return in about 1 month (around 10/10/2021).  Claretta Fraise, M.D.

## 2021-09-25 ENCOUNTER — Other Ambulatory Visit: Payer: Self-pay | Admitting: Family Medicine

## 2021-09-25 ENCOUNTER — Telehealth: Payer: Self-pay | Admitting: Family Medicine

## 2021-09-25 MED ORDER — NABUMETONE 500 MG PO TABS: 1000.0000 mg | ORAL_TABLET | Freq: Two times a day (BID) | ORAL | 1 refills | Status: DC

## 2021-09-25 NOTE — Telephone Encounter (Signed)
I sent nabumetone to [facility] for him

## 2021-09-25 NOTE — Telephone Encounter (Signed)
Pt returned missed call. Reviewed providers note with pt. Pt voiced understanding.  

## 2021-09-25 NOTE — Telephone Encounter (Signed)
Please look at xray

## 2021-09-25 NOTE — Telephone Encounter (Signed)
Lmtcb.

## 2021-09-25 NOTE — Telephone Encounter (Signed)
Patient states he still having the pain he stated he has been on lyrica for 15 days. Is there anything he can take while he waits for thr lyrica to get in his system good?

## 2021-09-25 NOTE — Telephone Encounter (Signed)
Tell pt.: XR showed some mild arthritis changes only.

## 2021-10-05 ENCOUNTER — Other Ambulatory Visit: Payer: Self-pay | Admitting: Family Medicine

## 2021-10-21 ENCOUNTER — Other Ambulatory Visit: Payer: Self-pay | Admitting: Family Medicine

## 2021-10-21 ENCOUNTER — Encounter: Payer: Self-pay | Admitting: Family Medicine

## 2021-10-21 ENCOUNTER — Ambulatory Visit (INDEPENDENT_AMBULATORY_CARE_PROVIDER_SITE_OTHER): Payer: Medicare Other | Admitting: Family Medicine

## 2021-10-21 VITALS — BP 130/86 | HR 90 | Temp 97.8°F | Ht 66.0 in | Wt 168.4 lb

## 2021-10-21 DIAGNOSIS — M5416 Radiculopathy, lumbar region: Secondary | ICD-10-CM

## 2021-10-21 MED ORDER — BETAMETHASONE SOD PHOS & ACET 6 (3-3) MG/ML IJ SUSP: 6.0000 mg | Freq: Once | INTRAMUSCULAR | Status: DC

## 2021-10-21 NOTE — Progress Notes (Signed)
Subjective:  Patient ID: Joshua Bowman, male    DOB: [birth year]  Age: 76 y.o. MRN: [id number]  CC: Back Pain   HPI Joshua Bowman presents for continued back pain. Now at 6/10. Was 9/10 at last O.V. Constant tightness. Gets a jabbing if he twists. Radiates from right L5 region to lateral thigh and around to anterior thigh.  He states that the Lyrica made him too drowsy to function when he got up to 3 tablets so he backed off to 2 and has stayed there.  His nabumetone is available at 2 pills twice a day but he has just been taking that 1 twice a day.  He has not tried it nor had any side effects with it up to twice a day.  He would like to try physical therapy.  He is curious if he has a herniated disc since he has had them before.  Depression screen [facility] 2/9 10/21/2021 10/21/2021 09/09/2021  Decreased Interest 1 0 1  Down, Depressed, Hopeless 0 0 0  PHQ - 2 Score 1 0 1  Altered sleeping 0 - 0  Tired, decreased energy 1 - 1  Change in appetite 0 - 0  Feeling bad or failure about yourself  0 - 0  Trouble concentrating 0 - 0  Moving slowly or fidgety/restless 0 - 0  Suicidal thoughts 0 - 0  PHQ-9 Score 2 - 2  Difficult doing work/chores Somewhat difficult - Somewhat difficult  Some recent data might be hidden    History Joshua Bowman has a past medical history of Anxiety, Arthritis, Bilateral carotid artery stenosis, Colon polyps - attenuated polyposis syndrome, COPD (chronic obstructive pulmonary disease) ([place]), Duodenitis, Gastritis, Hemorrhoids, Hyperlipidemia, Hyperplasia of prostate, IBS (irritable bowel syndrome), Pre-diabetes, and Sigmoid diverticulosis.   He has a past surgical history that includes Appendectomy (1969); nasal surgery ( spurs ) (70's); lipoma rt shoulder (01/2001); rt. shoulder -rotator cuff repair (08/2005); Colonoscopy; Esophagogastroduodenoscopy; Tonsillectomy; and Eye surgery.   His family history includes Arthritis in his sister; Congestive Heart Failure in his father;  Heart attack in his brother; Heart disease in his brother and mother; Post-traumatic stress disorder in his father; Prostate cancer in his brother.He reports that he has been smoking cigarettes. He started smoking about 41 years ago. He has a 40.00 pack-year smoking history. He has quit using smokeless tobacco. He reports that he does not currently use alcohol. He reports that he does not use drugs.    ROS Review of Systems  Constitutional:  Negative for fever.  Respiratory:  Negative for shortness of breath.   Cardiovascular:  Negative for chest pain.  Musculoskeletal:  Positive for back pain and myalgias. Negative for arthralgias.  Skin:  Negative for rash.   Objective:  BP 130/86    Pulse 90    Temp 97.8 F (36.6 C)    Ht 5\' 6"  (1.676 m)    Wt 168 lb 6.4 oz (76.4 kg)    SpO2 94%    BMI 27.18 kg/m   BP Readings from Last 3 Encounters:  10/21/21 130/86  09/09/21 101/76  08/21/21 (!) 146/94    Wt Readings from Last 3 Encounters:  10/21/21 168 lb 6.4 oz (76.4 kg)  09/09/21 168 lb 3.2 oz (76.3 kg)  08/21/21 170 lb (77.1 kg)     Physical Exam Vitals reviewed.  Constitutional:      General: He is in acute distress.     Appearance: He is well-developed.  HENT:  Head: Normocephalic.  Eyes:     Pupils: Pupils are equal, round, and reactive to light.  Cardiovascular:     Rate and Rhythm: Normal rate and regular rhythm.     Heart sounds: Normal heart sounds. No murmur heard. Pulmonary:     Effort: Pulmonary effort is normal.     Breath sounds: Normal breath sounds.  Abdominal:     Tenderness: There is no abdominal tenderness.  Musculoskeletal:        General: Tenderness (Left aspect of L5 with palpable spasm) present.     Cervical back: Normal range of motion.  Skin:    General: Skin is warm and dry.  Neurological:     Mental Status: He is alert and oriented to person, place, and time.     Deep Tendon Reflexes: Reflexes are normal and symmetric.  Psychiatric:         Behavior: Behavior normal.        Thought Content: Thought content normal.      Assessment & Plan:   Joshua Bowman was seen today for back pain.  Diagnoses and all orders for this visit:  Lumbar radiculopathy -     Ambulatory referral to Physical Therapy -     MR LUMBAR SPINE WO CONTRAST; Future -     betamethasone acetate-betamethasone sodium phosphate (CELESTONE) injection 6 mg       I am having Joshua Bowman maintain his aspirin, Vitamin D3, diclofenac sodium, albuterol, fexofenadine, tamsulosin, DULoxetine, HYDROcodone-acetaminophen, HYDROcodone-acetaminophen, HYDROcodone-acetaminophen, pregabalin, nabumetone, and Symbicort. We will continue to administer betamethasone acetate-betamethasone sodium phosphate and betamethasone acetate-betamethasone sodium phosphate.  Allergies as of 10/21/2021       Reactions   Lipitor [atorvastatin] Other (See Comments)   myalgias   Sulfa Antibiotics Itching, Rash        Medication List        Accurate as of October 21, 2021  2:30 PM. If you have any questions, ask your nurse or doctor.          albuterol 108 (90 Base) MCG/ACT inhaler Commonly known as: VENTOLIN HFA Inhale 2 puffs into the lungs every 4 (four) hours as needed for wheezing or shortness of breath.   aspirin 325 MG tablet Take 325 mg by mouth every other day.   diclofenac sodium 1 % Gel Commonly known as: Voltaren Apply 4 g topically 2 (two) times daily.   DULoxetine 60 MG capsule Commonly known as: CYMBALTA Take 1 capsule (60 mg total) by mouth 2 (two) times daily.   fexofenadine 180 MG tablet Commonly known as: ALLEGRA Take 1 tablet (180 mg total) by mouth daily. For allergy symptoms   HYDROcodone-acetaminophen 5-325 MG tablet Commonly known as: NORCO/VICODIN Take 0.5 tablets by mouth every 6 (six) hours as needed for moderate pain.   HYDROcodone-acetaminophen 5-325 MG tablet Commonly known as: NORCO/VICODIN Take 0.5 tablets by mouth every 6 (six) hours  as needed for moderate pain.   HYDROcodone-acetaminophen 5-325 MG tablet Commonly known as: NORCO/VICODIN Take 0.5 tablets by mouth every 6 (six) hours as needed for moderate pain. Start taking on: November 08, 2021   nabumetone 500 MG tablet Commonly known as: RELAFEN Take 2 tablets (1,000 mg total) by mouth 2 (two) times daily. For muscle and joint pain   pregabalin 50 MG capsule Commonly known as: Lyrica 1 qhs X7 days , then 2 qhs X 7d, then 3 qhs X 7d, then 4 qhs   Symbicort 160-4.5 MCG/ACT inhaler Generic drug: budesonide-formoterol INHALE 2 PUFFS INTO  THE LUNGS TWICE A DAY   tamsulosin 0.4 MG Caps capsule Commonly known as: FLOMAX TAKE 2 CAPSULES (0.8 MG TOTAL) BY MOUTH AT BEDTIME. FOR URINE FLOW AND PROSTATE   Vitamin D3 125 MCG (5000 UT) Caps Take 1 tablet by mouth. 2-3 x week         Follow-up: Return in about 1 month (around 11/21/2021).  Claretta Fraise, M.D.

## 2021-10-30 ENCOUNTER — Other Ambulatory Visit: Payer: Self-pay

## 2021-10-30 ENCOUNTER — Encounter: Payer: Self-pay | Admitting: Physical Therapy

## 2021-10-30 ENCOUNTER — Ambulatory Visit: Payer: Medicare Other | Attending: Family Medicine | Admitting: Physical Therapy

## 2021-10-30 DIAGNOSIS — M5441 Lumbago with sciatica, right side: Secondary | ICD-10-CM | POA: Insufficient documentation

## 2021-10-30 DIAGNOSIS — M5416 Radiculopathy, lumbar region: Secondary | ICD-10-CM | POA: Diagnosis not present

## 2021-10-30 DIAGNOSIS — R293 Abnormal posture: Secondary | ICD-10-CM | POA: Insufficient documentation

## 2021-10-30 NOTE — Therapy (Signed)
[facility] [place], [place], [address] Phone: [phone]   Fax:  [phone]  Physical Therapy Evaluation  Patient Details  Name: Joshua Bowman MRN: [id number] Date of Birth: 1946-06-05 Referring Provider (PT): Claretta Fraise MD   Encounter Date: 10/30/2021   PT End of Session - 10/30/21 1438     Visit Number 1    Number of Visits 12    Date for PT Re-Evaluation 01/29/22    Authorization Type FOTO AT LEAST EVERY 5TH VISIT.  PROGRESS NOTE AT 10TH VISIT.  KX MODIFIER AFTER 15 VISITS.    PT Start Time 0100    PT Stop Time 0155    PT Time Calculation (min) 55 min    Activity Tolerance Patient tolerated treatment well    Behavior During Therapy [facility] for tasks assessed/performed             Past Medical History:  Diagnosis Date   Anxiety    Arthritis    Bilateral carotid artery stenosis    Minimal 2012    Colon polyps - attenuated polyposis syndrome    multiple    COPD (chronic obstructive pulmonary disease) (HCC)    Duodenitis    Gastritis    Hemorrhoids    Hyperlipidemia    Hyperplasia of prostate    IBS (irritable bowel syndrome)    Pre-diabetes    Sigmoid diverticulosis    mild     Past Surgical History:  Procedure Laterality Date   APPENDECTOMY  1969   COLONOSCOPY     ESOPHAGOGASTRODUODENOSCOPY     EYE SURGERY     laser - left eye - Dr Zigmund Daniel   lipoma rt shoulder  01/2001   outpatient    nasal surgery ( spurs )  70's   rt. shoulder -rotator cuff repair  08/2005   Dr. Maxie Better    TONSILLECTOMY      There were no vitals filed for this visit.    Subjective Assessment - 10/30/21 1457     Subjective COVID-19 screen performed prior to patient entering clinic.  The patient presents to the clinic today with c/o right sided low back pain radiating into his anterior thigh.  He states he has had on/off back pain for years but usually improved with Chiropratic care.  However, in [place] of last year  (2022) the pain came and gradually became severe.  Today, his pain is a 4/10 but can rise to 7+/10 with prolonged walking and transitory movements such as going from sit to stand.  He is better than he was a few weeks ago.  Heat and TENS has been helpful.    Pertinent History OA, right shoulder surgery, COPD.    How long can you walk comfortably? Up to 30 minutes.    Patient Stated Goals Get ot of pain and do more.    Currently in Pain? Yes    Pain Score 4     Pain Location Back   Right thigh.   Pain Orientation Right    Pain Descriptors / Indicators Numbness;Sharp   "Tight."   Pain Type Acute pain    Pain Radiating Towards Right anterior thigh.    Pain Onset More than a month ago    Pain Frequency Constant    Aggravating Factors  See above.    Pain Relieving Factors See above.                [facility] PT Assessment - 10/30/21 0001  Assessment   Medical Diagnosis Lumbar radiculopathy    Referring Provider (PT) Claretta Fraise MD    Onset Date/Surgical Date --   11/22.     Precautions   Precautions None      Restrictions   Weight Bearing Restrictions No      Balance Screen   Has the patient fallen in the past 6 months No    Has the patient had a decrease in activity level because of a fear of falling?  No    Is the patient reluctant to leave their home because of a fear of falling?  No      Home Environment   Living Environment Private residence      Prior Function   Level of Independence Independent      Posture/Postural Control   Posture/Postural Control Postural limitations    Postural Limitations Rounded Shoulders;Forward head;Flexed trunk      Deep Tendon Reflexes   DTR Assessment Site Patella;Achilles    Patella DTR 1+    Achilles DTR 1+      ROM / Strength   AROM / PROM / Strength AROM;Strength      AROM   Overall AROM Comments Full active lumbar flexion and 15 degrees of extension which is painful.      Strength   Overall Strength Comments Normal  LE strength.      Palpation   Palpation comment Tender to palpation over right SIJ.      Special Tests   Other special tests (-) FABER and SLR testing.  Right LE longer then right which appears to be related to a right anterior pelvic rotation which was corrected with a right SKTC stretch.      Transfers   Comments Sit to stand with armrest and patient is in obvious pain.      Ambulation/Gait   Gait Comments Antalgic gait with patient walking in a flexed spine posture.                        Objective measurements completed on examination: See above findings.       [facility] Adult PT Treatment/Exercise - 10/30/21 0001       Modalities   Modalities Electrical Stimulation;Moist Heat      Moist Heat Therapy   Number Minutes Moist Heat 20 Minutes    Moist Heat Location Lumbar Spine      Electrical Stimulation   Electrical Stimulation Location Right low back    Electrical Stimulation Action IFC at 80-150 Hz.    Electrical Stimulation Parameters 40% scan x 20 minutes.    Electrical Stimulation Goals Pain                          PT Long Term Goals - 10/30/21 1518       PT LONG TERM GOAL #1   Title Independent with a HEP.    Time 6    Period Weeks    Status New      PT LONG TERM GOAL #2   Title Eliminate radiation of symptoms into right thigh.    Time 6    Period Weeks    Status New      PT LONG TERM GOAL #3   Title Walk a community distance with pain not > 3/10.    Time 6    Period Weeks    Status New      PT LONG TERM  GOAL #4   Title Perform ADL's with pain not > 3/10.    Time 6    Period Weeks    Status New                    Plan - 10/30/21 1512     Clinical Impression Statement The patient presenst to [facility] with c/o right-sided low back pain with radiation into his right anterior thigh that progressively worsened since November of last year.  His pain as improved some with medication.  He was found to be quite  tender to palpation over his right SIJ region.  His right LE is longer than the left but could be corrected with a right single knee to chest stretch.  FABER and SLR testing is normal.  He exhibits full lumbar flexion though extension is limited and painful.  He has no LE strength deficits.  Patient will benefit from skilled physical therapy intervention to address pain and deficits.    Personal Factors and Comorbidities Comorbidity 1;Other    Comorbidities OA, right shoulder surgery, COPD.    Examination-Activity Limitations Locomotion Level;Other    Examination-Participation Restrictions Other    Stability/Clinical Decision Making Stable/Uncomplicated    Clinical Decision Making Low    Rehab Potential Excellent    PT Frequency 2x / week    PT Duration 6 weeks    PT Treatment/Interventions ADLs/Self Care Home Management;Cryotherapy;Electrical Stimulation;Ultrasound;Traction;Moist Heat;Iontophoresis 4mg /ml Dexamethasone;Functional mobility training;Therapeutic activities;Therapeutic exercise;Manual techniques;Patient/family education;Passive range of motion;Dry needling;Joint Manipulations    PT Next Visit Plan Combo e'stim/US and STW/M, right SKTC, hip bridges.  Can begin intermittment lumbar traction at 60#.    Consulted and Agree with Plan of Care Patient             Patient will benefit from skilled therapeutic intervention in order to improve the following deficits and impairments:  Pain, Abnormal gait, Difficulty walking, Decreased activity tolerance, Decreased mobility, Decreased range of motion, Postural dysfunction  Visit Diagnosis: Acute right-sided low back pain with right-sided sciatica - Plan: PT plan of care cert/re-cert  Abnormal posture - Plan: PT plan of care cert/re-cert     Problem List Patient Active Problem List   Diagnosis Date Noted   Lumbar herniated disc 06/10/2021   Primary osteoarthritis involving multiple joints 07/20/2019   BMI 27.0-27.9,adult  07/20/2019   Depression, recurrent ([place]) 05/18/2019   GAD (generalized anxiety disorder) 05/18/2019   Vitamin D deficiency 05/04/2019   Chronic pain in right shoulder 05/04/2019   COPD (chronic obstructive pulmonary disease) ([place]) 06/20/2016   Coronary artery calcification 02/07/2015   BPH (benign prostatic hyperplasia) 38/32/9191   Metabolic syndrome 66/02/44   Carotid artery stenosis 12/06/2013   Abnormal chest CT 08/17/2013   Hypertension 03/30/2013   Degenerative arthritis 03/30/2013   Hyperlipidemia 03/30/2013   Personal history of adenomatous colonic polyps - attenuated Polyposis syndrome 05/06/2012    Abbegayle Denault, [place], PT 10/30/2021, 3:22 PM  [facility] [address] [place], [place], [address] Phone: [phone]   Fax:  [phone]  Name: EMMA SCHUPP MRN: [id number] Date of Birth: [birth year]

## 2021-10-31 ENCOUNTER — Ambulatory Visit: Payer: Medicare Other

## 2021-10-31 ENCOUNTER — Other Ambulatory Visit: Payer: Self-pay

## 2021-10-31 DIAGNOSIS — M5416 Radiculopathy, lumbar region: Secondary | ICD-10-CM | POA: Diagnosis not present

## 2021-10-31 DIAGNOSIS — M5441 Lumbago with sciatica, right side: Secondary | ICD-10-CM | POA: Diagnosis not present

## 2021-10-31 DIAGNOSIS — R293 Abnormal posture: Secondary | ICD-10-CM | POA: Diagnosis not present

## 2021-10-31 NOTE — Therapy (Signed)
[facility] [place], [place], [address] Phone: [phone]   Fax:  [phone]  Physical Therapy Treatment  Patient Details  Name: Joshua Bowman MRN: [id number] Date of Birth: 1946/08/09 Referring Provider (PT): Claretta Fraise MD   Encounter Date: 10/31/2021   PT End of Session - 10/31/21 1257     Visit Number 2    Number of Visits 12    Date for PT Re-Evaluation 01/29/22    Authorization Type FOTO AT LEAST EVERY 5TH VISIT.  PROGRESS NOTE AT 10TH VISIT.  KX MODIFIER AFTER 15 VISITS.    PT Start Time 1300    PT Stop Time 1345    PT Time Calculation (min) 45 min    Activity Tolerance Patient tolerated treatment well    Behavior During Therapy [name] for tasks assessed/performed             Past Medical History:  Diagnosis Date   Anxiety    Arthritis    Bilateral carotid artery stenosis    Minimal 2012    Colon polyps - attenuated polyposis syndrome    multiple    COPD (chronic obstructive pulmonary disease) (HCC)    Duodenitis    Gastritis    Hemorrhoids    Hyperlipidemia    Hyperplasia of prostate    IBS (irritable bowel syndrome)    Pre-diabetes    Sigmoid diverticulosis    mild     Past Surgical History:  Procedure Laterality Date   APPENDECTOMY  1969   COLONOSCOPY     ESOPHAGOGASTRODUODENOSCOPY     EYE SURGERY     laser - left eye - Dr Zigmund Daniel   lipoma rt shoulder  01/2001   outpatient    nasal surgery ( spurs )  70's   rt. shoulder -rotator cuff repair  08/2005   Dr. Maxie Better    TONSILLECTOMY      There were no vitals filed for this visit.   Subjective Assessment - 10/31/21 1257     Subjective COVID-19 screen performed prior to patient entering clinic.  Pt arrives for today's treatment session reporting 5/10 low back pain.    Pertinent History OA, right shoulder surgery, COPD.    How long can you walk comfortably? Up to 30 minutes.    Patient Stated Goals Get ot of pain and do more.     Currently in Pain? Yes    Pain Score 5     Pain Location Back    Pain Onset More than a month ago                               [name] Adult PT Treatment/Exercise - 10/31/21 0001       Exercises   Exercises Lumbar      Lumbar Exercises: Stretches   Single Knee to Chest Stretch Right;Left   10 reps x 5 sec hold   Lower Trunk Rotation Other (comment)   10 reps x 5 sec hold     Lumbar Exercises: Aerobic   Nustep Lvl 3 x 15 mins      Lumbar Exercises: Supine   Bridge 10 reps      Modalities   Modalities Traction      Traction   Type of Traction Lumbar    Min (lbs) 20    Max (lbs) 60    Hold Time 99    Rest Time 5    Time  15 mins                          PT Long Term Goals - 10/30/21 1518       PT LONG TERM GOAL #1   Title Independent with a HEP.    Time 6    Period Weeks    Status New      PT LONG TERM GOAL #2   Title Eliminate radiation of symptoms into right thigh.    Time 6    Period Weeks    Status New      PT LONG TERM GOAL #3   Title Walk a community distance with pain not > 3/10.    Time 6    Period Weeks    Status New      PT LONG TERM GOAL #4   Title Perform ADL's with pain not > 3/10.    Time 6    Period Weeks    Status New                   Plan - 10/31/21 1257     Clinical Impression Statement Pt arrives for today's treatment session reporting 5/10 low back pain.  Pt states that he feels more tight today than before and attributes this to the cold rainy weather.  Pt able to tolerate NuStep for warm-ups and reports decreased tightness post Nustep.  Lumbar traction performed with 60/20# max/min and 99/5 second on/off time.  Pt instructed in [facility] and LTR stretch  post traction with cues for hold.  Pt reported 2/10 low back pain at completion of today's treatment session.    Personal Factors and Comorbidities Comorbidity 1;Other    Comorbidities OA, right shoulder surgery, COPD.     Examination-Activity Limitations Locomotion Level;Other    Examination-Participation Restrictions Other    Stability/Clinical Decision Making Stable/Uncomplicated    Rehab Potential Excellent    PT Frequency 2x / week    PT Duration 6 weeks    PT Treatment/Interventions ADLs/Self Care Home Management;Cryotherapy;Electrical Stimulation;Ultrasound;Traction;Moist Heat;Iontophoresis 4mg /ml Dexamethasone;Functional mobility training;Therapeutic activities;Therapeutic exercise;Manual techniques;Patient/family education;Passive range of motion;Dry needling;Joint Manipulations    PT Next Visit Plan Combo e'stim/US and STW/M, right SKTC, hip bridges.  Can begin intermittment lumbar traction at 60#.    Consulted and Agree with Plan of Care Patient             Patient will benefit from skilled therapeutic intervention in order to improve the following deficits and impairments:  Pain, Abnormal gait, Difficulty walking, Decreased activity tolerance, Decreased mobility, Decreased range of motion, Postural dysfunction  Visit Diagnosis: Acute right-sided low back pain with right-sided sciatica  Abnormal posture     Problem List Patient Active Problem List   Diagnosis Date Noted   Lumbar herniated disc 06/10/2021   Primary osteoarthritis involving multiple joints 07/20/2019   BMI 27.0-27.9,adult 07/20/2019   Depression, recurrent ([name]) 05/18/2019   GAD (generalized anxiety disorder) 05/18/2019   Vitamin D deficiency 05/04/2019   Chronic pain in right shoulder 05/04/2019   COPD (chronic obstructive pulmonary disease) ([place]) 06/20/2016   Coronary artery calcification 02/07/2015   BPH (benign prostatic hyperplasia) 93/81/8299   Metabolic syndrome 37/16/9678   Carotid artery stenosis 12/06/2013   Abnormal chest CT 08/17/2013   Hypertension 03/30/2013   Degenerative arthritis 03/30/2013   Hyperlipidemia 03/30/2013   Personal history of adenomatous colonic polyps - attenuated Polyposis syndrome  05/06/2012    Kathrynn Ducking, PTA 10/31/2021, 1:47  PM  [name], [place], [address] Phone: [phone]   Fax:  [phone]  Name: Joshua Bowman MRN: [id number] Date of Birth: [birth year]

## 2021-11-05 ENCOUNTER — Other Ambulatory Visit: Payer: Self-pay | Admitting: Family Medicine

## 2021-11-05 ENCOUNTER — Other Ambulatory Visit: Payer: Self-pay

## 2021-11-05 ENCOUNTER — Ambulatory Visit: Payer: Medicare Other | Admitting: Physical Therapy

## 2021-11-05 DIAGNOSIS — M5441 Lumbago with sciatica, right side: Secondary | ICD-10-CM | POA: Diagnosis not present

## 2021-11-05 DIAGNOSIS — R293 Abnormal posture: Secondary | ICD-10-CM | POA: Diagnosis not present

## 2021-11-05 DIAGNOSIS — M5416 Radiculopathy, lumbar region: Secondary | ICD-10-CM | POA: Diagnosis not present

## 2021-11-05 NOTE — Therapy (Signed)
[facility] [place], [place], [address] Phone: [phone]   Fax:  [phone]  Physical Therapy Treatment  Patient Details  Name: Joshua Bowman MRN: [id number] Date of Birth: 06-10-1946 Referring Provider (PT): Claretta Fraise MD   Encounter Date: 11/05/2021   PT End of Session - 11/05/21 1423     Visit Number 3    Number of Visits 12    Date for PT Re-Evaluation 01/29/22    Authorization Type FOTO AT LEAST EVERY 5TH VISIT.  PROGRESS NOTE AT 10TH VISIT.  KX MODIFIER AFTER 15 VISITS.    PT Start Time 0145    PT Stop Time 0238    PT Time Calculation (min) 53 min             Past Medical History:  Diagnosis Date   Anxiety    Arthritis    Bilateral carotid artery stenosis    Minimal 2012    Colon polyps - attenuated polyposis syndrome    multiple    COPD (chronic obstructive pulmonary disease) (HCC)    Duodenitis    Gastritis    Hemorrhoids    Hyperlipidemia    Hyperplasia of prostate    IBS (irritable bowel syndrome)    Pre-diabetes    Sigmoid diverticulosis    mild     Past Surgical History:  Procedure Laterality Date   APPENDECTOMY  1969   COLONOSCOPY     ESOPHAGOGASTRODUODENOSCOPY     EYE SURGERY     laser - left eye - Dr Zigmund Daniel   lipoma rt shoulder  01/2001   outpatient    nasal surgery ( spurs )  70's   rt. shoulder -rotator cuff repair  08/2005   Dr. Maxie Better    TONSILLECTOMY      There were no vitals filed for this visit.   Subjective Assessment - 11/05/21 1424     Subjective COVID-19 screen performed prior to patient entering clinic.  last treatment helped.  Patient enjoyed traction and would like to go up in weight.    Pertinent History OA, right shoulder surgery, COPD.    How long can you walk comfortably? Up to 30 minutes.    Patient Stated Goals Get ot of pain and do more.    Currently in Pain? Yes    Pain Score 4     Pain Location Back    Pain Orientation Right    Pain  Descriptors / Indicators Numbness;Sharp    Pain Onset More than a month ago                               [facility] Adult PT Treatment/Exercise - 11/05/21 0001       Exercises   Exercises Knee/Hip      Lumbar Exercises: Aerobic   Nustep Level 3 x 15 minutes.      Traction   Type of Traction Lumbar    Min (lbs) 15    Max (lbs) 75    Hold Time 99    Rest Time 5    Time 15      Manual Therapy   Manual Therapy Soft tissue mobilization    Soft tissue mobilization In left sdly:  STW/M x 8 minutes to patient's right SIJ region.                          PT Long Term  Goals - 10/30/21 1518       PT LONG TERM GOAL #1   Title Independent with a HEP.    Time 6    Period Weeks    Status New      PT LONG TERM GOAL #2   Title Eliminate radiation of symptoms into right thigh.    Time 6    Period Weeks    Status New      PT LONG TERM GOAL #3   Title Walk a community distance with pain not > 3/10.    Time 6    Period Weeks    Status New      PT LONG TERM GOAL #4   Title Perform ADL's with pain not > 3/10.    Time 6    Period Weeks    Status New                   Plan - 11/05/21 1442     Clinical Impression Statement Patient did great today with a 15# increase in intermittment lumbar traction today.  Felt good after treatment.    Personal Factors and Comorbidities Comorbidity 1;Other    Comorbidities OA, right shoulder surgery, COPD.    Examination-Participation Restrictions Other    Rehab Potential Excellent    PT Duration 6 weeks    PT Treatment/Interventions ADLs/Self Care Home Management;Cryotherapy;Electrical Stimulation;Ultrasound;Traction;Moist Heat;Iontophoresis 4mg /ml Dexamethasone;Functional mobility training;Therapeutic activities;Therapeutic exercise;Manual techniques;Patient/family education;Passive range of motion;Dry needling;Joint Manipulations    PT Next Visit Plan Combo e'stim/US and STW/M, right SKTC, hip  bridges.  Can begin intermittment lumbar traction at 60#.    Consulted and Agree with Plan of Care Patient             Patient will benefit from skilled therapeutic intervention in order to improve the following deficits and impairments:     Visit Diagnosis: Acute right-sided low back pain with right-sided sciatica  Abnormal posture     Problem List Patient Active Problem List   Diagnosis Date Noted   Lumbar herniated disc 06/10/2021   Primary osteoarthritis involving multiple joints 07/20/2019   BMI 27.0-27.9,adult 07/20/2019   Depression, recurrent ([place]) 05/18/2019   GAD (generalized anxiety disorder) 05/18/2019   Vitamin D deficiency 05/04/2019   Chronic pain in right shoulder 05/04/2019   COPD (chronic obstructive pulmonary disease) ([place]) 06/20/2016   Coronary artery calcification 02/07/2015   BPH (benign prostatic hyperplasia) 25/85/2778   Metabolic syndrome 24/23/5361   Carotid artery stenosis 12/06/2013   Abnormal chest CT 08/17/2013   Hypertension 03/30/2013   Degenerative arthritis 03/30/2013   Hyperlipidemia 03/30/2013   Personal history of adenomatous colonic polyps - attenuated Polyposis syndrome 05/06/2012    Roy Snuffer, [place], PT 11/05/2021, 2:45 PM  [facility] [address] [place], [place], [address] Phone: [phone]   Fax:  [phone]  Name: CARNEY SAXTON MRN: [id number] Date of Birth: [birth year]

## 2021-11-07 ENCOUNTER — Ambulatory Visit: Payer: Medicare Other

## 2021-11-07 ENCOUNTER — Other Ambulatory Visit: Payer: Self-pay

## 2021-11-07 DIAGNOSIS — R293 Abnormal posture: Secondary | ICD-10-CM

## 2021-11-07 DIAGNOSIS — M5416 Radiculopathy, lumbar region: Secondary | ICD-10-CM | POA: Diagnosis not present

## 2021-11-07 DIAGNOSIS — M5441 Lumbago with sciatica, right side: Secondary | ICD-10-CM | POA: Diagnosis not present

## 2021-11-07 NOTE — Therapy (Signed)
[facility] [place], [place], [address] Phone: [phone]   Fax:  [phone]  Physical Therapy Treatment  Patient Details  Name: Joshua Bowman MRN: [id number] Date of Birth: 07-21-1946 Referring Provider (PT): Claretta Fraise MD   Encounter Date: 11/07/2021   PT End of Session - 11/07/21 1504     Visit Number 4    Number of Visits 12    Date for PT Re-Evaluation 01/29/22    Authorization Type FOTO AT LEAST EVERY 5TH VISIT.  PROGRESS NOTE AT 10TH VISIT.  KX MODIFIER AFTER 15 VISITS.    PT Start Time 1502    PT Stop Time 1554    PT Time Calculation (min) 52 min             Past Medical History:  Diagnosis Date   Anxiety    Arthritis    Bilateral carotid artery stenosis    Minimal 2012    Colon polyps - attenuated polyposis syndrome    multiple    COPD (chronic obstructive pulmonary disease) (HCC)    Duodenitis    Gastritis    Hemorrhoids    Hyperlipidemia    Hyperplasia of prostate    IBS (irritable bowel syndrome)    Pre-diabetes    Sigmoid diverticulosis    mild     Past Surgical History:  Procedure Laterality Date   APPENDECTOMY  1969   COLONOSCOPY     ESOPHAGOGASTRODUODENOSCOPY     EYE SURGERY     laser - left eye - Dr Zigmund Daniel   lipoma rt shoulder  01/2001   outpatient    nasal surgery ( spurs )  70's   rt. shoulder -rotator cuff repair  08/2005   Dr. Maxie Better    TONSILLECTOMY      There were no vitals filed for this visit.   Subjective Assessment - 11/07/21 1503     Subjective COVID-19 screen performed prior to patient entering clinic.  Pt arrives for today's treatment session reporting 3/10 low back pain and stiffness.  Patient enjoyed traction and would like to go up in weight.    Pertinent History OA, right shoulder surgery, COPD.    How long can you walk comfortably? Up to 30 minutes.    Patient Stated Goals Get ot of pain and do more.    Currently in Pain? Yes    Pain Score 3      Pain Location Back    Pain Onset More than a month ago                               [facility] Adult PT Treatment/Exercise - 11/07/21 0001       Lumbar Exercises: Aerobic   Nustep Lvl 3 x 15 mins      Modalities   Modalities Traction      Traction   Type of Traction Lumbar    Min (lbs) 15    Max (lbs) 85    Hold Time 99    Rest Time 5    Time 15      Manual Therapy   Manual Therapy Soft tissue mobilization    Soft tissue mobilization In left sdly:  STW/M x 8 minutes to patient's right SIJ region.                          PT Long Term Goals - 10/30/21 6063  PT LONG TERM GOAL #1   Title Independent with a HEP.    Time 6    Period Weeks    Status New      PT LONG TERM GOAL #2   Title Eliminate radiation of symptoms into right thigh.    Time 6    Period Weeks    Status New      PT LONG TERM GOAL #3   Title Walk a community distance with pain not > 3/10.    Time 6    Period Weeks    Status New      PT LONG TERM GOAL #4   Title Perform ADL's with pain not > 3/10.    Time 6    Period Weeks    Status New                   Plan - 11/07/21 1505     Clinical Impression Statement Pt arrives for today's treatment reporting 3/10 low back pain.  Pt able to tolerate 10# increase in intermittent lumbar traction today.  STW/M performed to pt's lumbar spine to decrease pain and tone.  Pt reported 1/10 low back pain upon completion of today's treatment session.    Personal Factors and Comorbidities Comorbidity 1;Other    Comorbidities OA, right shoulder surgery, COPD.    Examination-Participation Restrictions Other    Rehab Potential Excellent    PT Duration 6 weeks    PT Treatment/Interventions ADLs/Self Care Home Management;Cryotherapy;Electrical Stimulation;Ultrasound;Traction;Moist Heat;Iontophoresis 4mg /ml Dexamethasone;Functional mobility training;Therapeutic activities;Therapeutic exercise;Manual  techniques;Patient/family education;Passive range of motion;Dry needling;Joint Manipulations    PT Next Visit Plan Combo e'stim/US and STW/M, right SKTC, hip bridges.  Can begin intermittment lumbar traction at 60#.    Consulted and Agree with Plan of Care Patient             Patient will benefit from skilled therapeutic intervention in order to improve the following deficits and impairments:     Visit Diagnosis: Acute right-sided low back pain with right-sided sciatica  Abnormal posture     Problem List Patient Active Problem List   Diagnosis Date Noted   Lumbar herniated disc 06/10/2021   Primary osteoarthritis involving multiple joints 07/20/2019   BMI 27.0-27.9,adult 07/20/2019   Depression, recurrent ([place]) 05/18/2019   GAD (generalized anxiety disorder) 05/18/2019   Vitamin D deficiency 05/04/2019   Chronic pain in right shoulder 05/04/2019   COPD (chronic obstructive pulmonary disease) ([employer]) 06/20/2016   Coronary artery calcification 02/07/2015   BPH (benign prostatic hyperplasia) 16/60/6301   Metabolic syndrome 60/06/9322   Carotid artery stenosis 12/06/2013   Abnormal chest CT 08/17/2013   Hypertension 03/30/2013   Degenerative arthritis 03/30/2013   Hyperlipidemia 03/30/2013   Personal history of adenomatous colonic polyps - attenuated Polyposis syndrome 05/06/2012    Kathrynn Ducking, PTA 11/07/2021, 3:56 PM  [facility] [address] [place], [place], [address] Phone: [phone]   Fax:  [phone]  Name: KEEON ZURN MRN: [id number] Date of Birth: [birth year]

## 2021-11-11 ENCOUNTER — Ambulatory Visit: Payer: Medicare Other | Admitting: Physical Therapy

## 2021-11-11 ENCOUNTER — Encounter: Payer: Self-pay | Admitting: Physical Therapy

## 2021-11-11 ENCOUNTER — Other Ambulatory Visit: Payer: Self-pay

## 2021-11-11 DIAGNOSIS — R293 Abnormal posture: Secondary | ICD-10-CM | POA: Diagnosis not present

## 2021-11-11 DIAGNOSIS — M5416 Radiculopathy, lumbar region: Secondary | ICD-10-CM | POA: Diagnosis not present

## 2021-11-11 DIAGNOSIS — M5441 Lumbago with sciatica, right side: Secondary | ICD-10-CM

## 2021-11-11 NOTE — Therapy (Signed)
[facility] [place], [place], [address] Phone: [phone]   Fax:  [phone]  Physical Therapy Treatment  Patient Details  Name: Joshua Bowman MRN: [id number] Date of Birth: 1945-10-04 Referring Provider (PT): Claretta Fraise MD   Encounter Date: 11/11/2021   PT End of Session - 11/11/21 1430     Visit Number 5    Number of Visits 12    Date for PT Re-Evaluation 01/29/22    Authorization Type FOTO AT LEAST EVERY 5TH VISIT.  PROGRESS NOTE AT 10TH VISIT.  KX MODIFIER AFTER 15 VISITS.    PT Start Time 1424    PT Stop Time 1511    PT Time Calculation (min) 47 min    Activity Tolerance Patient tolerated treatment well    Behavior During Therapy [facility] for tasks assessed/performed             Past Medical History:  Diagnosis Date   Anxiety    Arthritis    Bilateral carotid artery stenosis    Minimal 2012    Colon polyps - attenuated polyposis syndrome    multiple    COPD (chronic obstructive pulmonary disease) (HCC)    Duodenitis    Gastritis    Hemorrhoids    Hyperlipidemia    Hyperplasia of prostate    IBS (irritable bowel syndrome)    Pre-diabetes    Sigmoid diverticulosis    mild     Past Surgical History:  Procedure Laterality Date   APPENDECTOMY  1969   COLONOSCOPY     ESOPHAGOGASTRODUODENOSCOPY     EYE SURGERY     laser - left eye - Dr Zigmund Daniel   lipoma rt shoulder  01/2001   outpatient    nasal surgery ( spurs )  70's   rt. shoulder -rotator cuff repair  08/2005   Dr. Maxie Better    TONSILLECTOMY      There were no vitals filed for this visit.   Subjective Assessment - 11/11/21 1425     Subjective COVID-19 screen performed prior to patient entering clinic. Reports stiffness and a toothache feel in low back today. Reports that pain is not like it used to be prior to PT. RLE radicular pain is not as often as before. Still has some radicular symptoms down R anterior thigh but only if he turns and puts  weight through RLE.    Pertinent History OA, right shoulder surgery, COPD.    How long can you walk comfortably? Up to 30 minutes.    Patient Stated Goals Get ot of pain and do more.    Currently in Pain? Yes    Pain Score 3     Pain Location Back    Pain Orientation Lower    Pain Descriptors / Indicators Aching;Tightness    Pain Type Acute pain    Pain Onset More than a month ago    Pain Frequency Constant                [facility] PT Assessment - 11/11/21 0001       Assessment   Medical Diagnosis Lumbar radiculopathy    Referring Provider (PT) Claretta Fraise MD      Precautions   Precautions None      Restrictions   Weight Bearing Restrictions No                           [facility] Adult PT Treatment/Exercise - 11/11/21 0001  Lumbar Exercises: Stretches   Single Knee to Chest Stretch Right;3 reps;30 seconds    Lower Trunk Rotation 5 reps;10 seconds    Figure 4 Stretch 3 reps;30 seconds;Supine;Without overpressure      Lumbar Exercises: Aerobic   Nustep Lvl 3 x 15 mins      Lumbar Exercises: Supine   Bridge 10 reps;3 seconds      Modalities   Modalities Traction      Traction   Type of Traction Lumbar    Min (lbs) 15    Max (lbs) 90    Hold Time 99    Rest Time 5    Time 15                          PT Long Term Goals - 11/11/21 1431       PT LONG TERM GOAL #1   Title Independent with a HEP.    Time 6    Period Weeks    Status On-going      PT LONG TERM GOAL #2   Title Eliminate radiation of symptoms into right thigh.    Time 6    Period Weeks    Status On-going      PT LONG TERM GOAL #3   Title Walk a community distance with pain not > 3/10.    Time 6    Period Weeks    Status On-going      PT LONG TERM GOAL #4   Title Perform ADL's with pain not > 3/10.    Time 6    Period Weeks    Status On-going                   Plan - 11/11/21 1501     Clinical Impression Statement Patient presented  in clinic with reports of lumbar stiffness/tightness and ache upon arrival. Patient progressed through light stretching and lumbar strengthening. Patient did report some low back tingling with bridging. Standing or walking for prolonged periods still causes increased pain. Patient notes the most difference with lumbar traction which was increased to 90# max.    Personal Factors and Comorbidities Comorbidity 1;Other    Comorbidities OA, right shoulder surgery, COPD.    Examination-Activity Limitations Locomotion Level;Other    Examination-Participation Restrictions Other    Stability/Clinical Decision Making Stable/Uncomplicated    Rehab Potential Excellent    PT Frequency 2x / week    PT Duration 6 weeks    PT Treatment/Interventions ADLs/Self Care Home Management;Cryotherapy;Electrical Stimulation;Ultrasound;Traction;Moist Heat;Iontophoresis 4mg /ml Dexamethasone;Functional mobility training;Therapeutic activities;Therapeutic exercise;Manual techniques;Patient/family education;Passive range of motion;Dry needling;Joint Manipulations    PT Next Visit Plan Combo e'stim/US and STW/M, right SKTC, hip bridges.  Can begin intermittment lumbar traction at 60#.    Consulted and Agree with Plan of Care Patient             Patient will benefit from skilled therapeutic intervention in order to improve the following deficits and impairments:  Pain, Abnormal gait, Difficulty walking, Decreased activity tolerance, Decreased mobility, Decreased range of motion, Postural dysfunction  Visit Diagnosis: Acute right-sided low back pain with right-sided sciatica  Abnormal posture     Problem List Patient Active Problem List   Diagnosis Date Noted   Lumbar herniated disc 06/10/2021   Primary osteoarthritis involving multiple joints 07/20/2019   BMI 27.0-27.9,adult 07/20/2019   Depression, recurrent ([place]) 05/18/2019   GAD (generalized anxiety disorder) 05/18/2019   Vitamin D deficiency 05/04/2019  Chronic pain in right shoulder 05/04/2019   COPD (chronic obstructive pulmonary disease) ([place]) 06/20/2016   Coronary artery calcification 02/07/2015   BPH (benign prostatic hyperplasia) 23/36/1224   Metabolic syndrome 49/75/3005   Carotid artery stenosis 12/06/2013   Abnormal chest CT 08/17/2013   Hypertension 03/30/2013   Degenerative arthritis 03/30/2013   Hyperlipidemia 03/30/2013   Personal history of adenomatous colonic polyps - attenuated Polyposis syndrome 05/06/2012    Standley Brooking, PTA 11/11/2021, 3:12 PM  [facility] [address] [place], [place], [address] Phone: [phone]   Fax:  [phone]  Name: ARLYN BUERKLE MRN: [id number] Date of Birth: 1945-12-04

## 2021-11-13 ENCOUNTER — Telehealth: Payer: Self-pay | Admitting: Family Medicine

## 2021-11-13 ENCOUNTER — Other Ambulatory Visit: Payer: Self-pay | Admitting: Nurse Practitioner

## 2021-11-13 MED ORDER — HYDROCODONE-ACETAMINOPHEN 5-325 MG PO TABS: 0.5000 | ORAL_TABLET | Freq: Four times a day (QID) | ORAL | 0 refills | Status: DC | PRN

## 2021-11-13 NOTE — Telephone Encounter (Signed)
Stacks patient  Patient is calling because [facility] is out of stock on Hydrocodone.  He has contacted [employer] and it is available and he is wondering if it can be sent there instead.

## 2021-11-13 NOTE — Telephone Encounter (Signed)
°  Prescription Request  11/13/2021  Is this a "Controlled Substance" medicine? yes  Have you seen your PCP in the last 2 weeks? No, appt 3/9  If YES, route message to pool  -  If NO, patient needs to be scheduled for appointment.  What is the name of the medication or equipment? HYDROcodone-acetaminophen (NORCO/VICODIN) 5-325 MG tablet  Have you contacted your pharmacy to request a refill? Yes, [facility] is still out    Which pharmacy would you like this sent to? [facility], [place]   Patient notified that their request is being sent to the clinical staff for review and that they should receive a response within 2 business days.

## 2021-11-14 ENCOUNTER — Other Ambulatory Visit: Payer: Self-pay

## 2021-11-14 ENCOUNTER — Encounter: Payer: Self-pay | Admitting: Physical Therapy

## 2021-11-14 ENCOUNTER — Ambulatory Visit: Payer: Medicare Other | Admitting: Physical Therapy

## 2021-11-14 DIAGNOSIS — M5441 Lumbago with sciatica, right side: Secondary | ICD-10-CM | POA: Diagnosis not present

## 2021-11-14 DIAGNOSIS — R293 Abnormal posture: Secondary | ICD-10-CM | POA: Diagnosis not present

## 2021-11-14 DIAGNOSIS — M5416 Radiculopathy, lumbar region: Secondary | ICD-10-CM | POA: Diagnosis not present

## 2021-11-14 NOTE — Therapy (Signed)
[facility] [place], [place], [address] Phone: [phone]   Fax:  [phone]  Physical Therapy Treatment  Patient Details  Name: Joshua Bowman MRN: [id number] Date of Birth: Apr 18, 1946 Referring Provider (PT): Claretta Fraise MD   Encounter Date: 11/14/2021   PT End of Session - 11/14/21 1505     Visit Number 6    Number of Visits 12    Date for PT Re-Evaluation 01/29/22    Authorization Type FOTO AT LEAST EVERY 5TH VISIT.  PROGRESS NOTE AT 10TH VISIT.  KX MODIFIER AFTER 15 VISITS.    PT Start Time 0135    PT Stop Time 0228    PT Time Calculation (min) 53 min    Activity Tolerance Patient tolerated treatment well    Behavior During Therapy [facility] for tasks assessed/performed             Past Medical History:  Diagnosis Date   Anxiety    Arthritis    Bilateral carotid artery stenosis    Minimal 2012    Colon polyps - attenuated polyposis syndrome    multiple    COPD (chronic obstructive pulmonary disease) (HCC)    Duodenitis    Gastritis    Hemorrhoids    Hyperlipidemia    Hyperplasia of prostate    IBS (irritable bowel syndrome)    Pre-diabetes    Sigmoid diverticulosis    mild     Past Surgical History:  Procedure Laterality Date   APPENDECTOMY  1969   COLONOSCOPY     ESOPHAGOGASTRODUODENOSCOPY     EYE SURGERY     laser - left eye - Dr Zigmund Daniel   lipoma rt shoulder  01/2001   outpatient    nasal surgery ( spurs )  70's   rt. shoulder -rotator cuff repair  08/2005   Dr. Maxie Better    TONSILLECTOMY      There were no vitals filed for this visit.   Subjective Assessment - 11/14/21 1505     Subjective COVID-19 screen performed prior to patient entering clinic.  Doing much better.    Pertinent History OA, right shoulder surgery, COPD.    How long can you walk comfortably? Up to 30 minutes.    Patient Stated Goals Get ot of pain and do more.    Currently in Pain? Yes    Pain Score 2     Pain  Location Back    Pain Orientation Lower    Pain Descriptors / Indicators Tightness    Pain Type Acute pain    Pain Onset More than a month ago                               [facility] Adult PT Treatment/Exercise - 11/14/21 0001       Exercises   Exercises Knee/Hip      Lumbar Exercises: Aerobic   Nustep Level 3 x 15 minutes.      Lumbar Exercises: Machines for Strengthening   Cybex Lumbar Extension 60# x 4 minutes.    Other Lumbar Machine Exercise Ab curls @ 60# x 4 minutes.      Modalities   Modalities Traction      Traction   Type of Traction Lumbar    Min (lbs) 20    Max (lbs) 100    Hold Time 99    Rest Time 5    Time 15  PT Long Term Goals - 11/11/21 1431       PT LONG TERM GOAL #1   Title Independent with a HEP.    Time 6    Period Weeks    Status On-going      PT LONG TERM GOAL #2   Title Eliminate radiation of symptoms into right thigh.    Time 6    Period Weeks    Status On-going      PT LONG TERM GOAL #3   Title Walk a community distance with pain not > 3/10.    Time 6    Period Weeks    Status On-going      PT LONG TERM GOAL #4   Title Perform ADL's with pain not > 3/10.    Time 6    Period Weeks    Status On-going                   Plan - 11/14/21 1512     Clinical Impression Statement Patient is responding well to treatment and feels significantly better since starting PT.  He described his symptoms today more as a tightness than actual pain.  He did very well with a 10# increase in intermittment lumbar traction and feel good after treatment.    Personal Factors and Comorbidities Comorbidity 1;Other    Comorbidities OA, right shoulder surgery, COPD.    Examination-Activity Limitations Locomotion Level;Other    Examination-Participation Restrictions Other    Stability/Clinical Decision Making Stable/Uncomplicated    Rehab Potential Excellent    PT Frequency 2x / week     PT Duration 6 weeks    PT Treatment/Interventions ADLs/Self Care Home Management;Cryotherapy;Electrical Stimulation;Ultrasound;Traction;Moist Heat;Iontophoresis 4mg /ml Dexamethasone;Functional mobility training;Therapeutic activities;Therapeutic exercise;Manual techniques;Patient/family education;Passive range of motion;Dry needling;Joint Manipulations    PT Next Visit Plan Remain at 100# of intermittment lumbar traction.  May consider using axillary pads on traction table.    Consulted and Agree with Plan of Care Patient             Patient will benefit from skilled therapeutic intervention in order to improve the following deficits and impairments:  Pain, Abnormal gait, Difficulty walking, Decreased activity tolerance, Decreased mobility, Decreased range of motion, Postural dysfunction  Visit Diagnosis: Acute right-sided low back pain with right-sided sciatica  Abnormal posture     Problem List Patient Active Problem List   Diagnosis Date Noted   Lumbar herniated disc 06/10/2021   Primary osteoarthritis involving multiple joints 07/20/2019   BMI 27.0-27.9,adult 07/20/2019   Depression, recurrent ([place]) 05/18/2019   GAD (generalized anxiety disorder) 05/18/2019   Vitamin D deficiency 05/04/2019   Chronic pain in right shoulder 05/04/2019   COPD (chronic obstructive pulmonary disease) ([place]) 06/20/2016   Coronary artery calcification 02/07/2015   BPH (benign prostatic hyperplasia) 62/37/6283   Metabolic syndrome 15/17/6160   Carotid artery stenosis 12/06/2013   Abnormal chest CT 08/17/2013   Hypertension 03/30/2013   Degenerative arthritis 03/30/2013   Hyperlipidemia 03/30/2013   Personal history of adenomatous colonic polyps - attenuated Polyposis syndrome 05/06/2012    Cyriah Childrey, [place], PT 11/14/2021, 3:16 PM  [facility] [address] [place], [place], [address] Phone: [phone]   Fax:  [phone]  Name: Joshua Bowman MRN: [id number] Date of Birth: 07-04-1946

## 2021-11-14 NOTE — Telephone Encounter (Signed)
Patient returned call - information given regarding refill.

## 2021-11-14 NOTE — Telephone Encounter (Signed)
Lmtcb.

## 2021-11-18 ENCOUNTER — Ambulatory Visit: Payer: Medicare Other | Admitting: Physical Therapy

## 2021-11-18 ENCOUNTER — Encounter: Payer: Self-pay | Admitting: Physical Therapy

## 2021-11-18 ENCOUNTER — Other Ambulatory Visit: Payer: Self-pay

## 2021-11-18 DIAGNOSIS — R293 Abnormal posture: Secondary | ICD-10-CM | POA: Diagnosis not present

## 2021-11-18 DIAGNOSIS — M5441 Lumbago with sciatica, right side: Secondary | ICD-10-CM | POA: Diagnosis not present

## 2021-11-18 DIAGNOSIS — M5416 Radiculopathy, lumbar region: Secondary | ICD-10-CM | POA: Diagnosis not present

## 2021-11-18 NOTE — Therapy (Signed)
[facility] [place], [place], [address] Phone: [phone]   Fax:  [phone]  Physical Therapy Treatment  Patient Details  Name: Joshua Bowman MRN: [id number] Date of Birth: 07-24-1946 Referring Provider (PT): Claretta Fraise MD   Encounter Date: 11/18/2021   PT End of Session - 11/18/21 1549     Visit Number 7    Number of Visits 12    Date for PT Re-Evaluation 01/29/22    Authorization Type FOTO AT LEAST EVERY 5TH VISIT.  PROGRESS NOTE AT 10TH VISIT.  KX MODIFIER AFTER 15 VISITS.    PT Start Time 0230    PT Stop Time 0319    PT Time Calculation (min) 49 min    Activity Tolerance Patient tolerated treatment well    Behavior During Therapy [facility] for tasks assessed/performed             Past Medical History:  Diagnosis Date   Anxiety    Arthritis    Bilateral carotid artery stenosis    Minimal 2012    Colon polyps - attenuated polyposis syndrome    multiple    COPD (chronic obstructive pulmonary disease) (HCC)    Duodenitis    Gastritis    Hemorrhoids    Hyperlipidemia    Hyperplasia of prostate    IBS (irritable bowel syndrome)    Pre-diabetes    Sigmoid diverticulosis    mild     Past Surgical History:  Procedure Laterality Date   APPENDECTOMY  1969   COLONOSCOPY     ESOPHAGOGASTRODUODENOSCOPY     EYE SURGERY     laser - left eye - Dr Zigmund Daniel   lipoma rt shoulder  01/2001   outpatient    nasal surgery ( spurs )  70's   rt. shoulder -rotator cuff repair  08/2005   Dr. Maxie Better    TONSILLECTOMY      There were no vitals filed for this visit.   Subjective Assessment - 11/18/21 1549     Subjective Patient pleased with his progress.  Pain has been lower but he still c/o stiffness in his right low back.    Pertinent History OA, right shoulder surgery, COPD.    How long can you walk comfortably? Up to 30 minutes.    Patient Stated Goals Get ot of pain and do more.    Currently in Pain? Yes     Pain Score 2     Pain Location Back    Pain Orientation Right    Pain Descriptors / Indicators --   Stiffness.   Pain Type Acute pain    Pain Onset More than a month ago                               [facility] Adult PT Treatment/Exercise - 11/18/21 0001       Exercises   Exercises Knee/Hip      Lumbar Exercises: Aerobic   Nustep Level 3 x 13 minutes.      Modalities   Modalities Traction;Ultrasound      Ultrasound   Ultrasound Location Patient in left sdly position with folded pillow between knees for comfort:  Combo e'stim/US at 1.50 W/CM2 x 10 minutes to patient's right SIJ region.      Traction   Type of Traction Lumbar    Min (lbs) 20    Max (lbs) 100    Hold Time 99  Rest Time 5    Time 15                          PT Long Term Goals - 11/11/21 1431       PT LONG TERM GOAL #1   Title Independent with a HEP.    Time 6    Period Weeks    Status On-going      PT LONG TERM GOAL #2   Title Eliminate radiation of symptoms into right thigh.    Time 6    Period Weeks    Status On-going      PT LONG TERM GOAL #3   Title Walk a community distance with pain not > 3/10.    Time 6    Period Weeks    Status On-going      PT LONG TERM GOAL #4   Title Perform ADL's with pain not > 3/10.    Time 6    Period Weeks    Status On-going                   Plan - 11/18/21 1557     Clinical Impression Statement Patient has been reportsing a consistently lower pain-level.  He has c/o stiffness in his right SIJ region.  He has done very well with traction and enjoyed the combined ultrasound/electrical stimulation treatment today.  He felt good after treatment.    Personal Factors and Comorbidities Comorbidity 1;Other    Comorbidities OA, right shoulder surgery, COPD.    Examination-Activity Limitations Locomotion Level;Other    Examination-Participation Restrictions Other    Stability/Clinical Decision Making  Stable/Uncomplicated    Rehab Potential Excellent    PT Frequency 2x / week    PT Duration 6 weeks    PT Treatment/Interventions ADLs/Self Care Home Management;Cryotherapy;Electrical Stimulation;Ultrasound;Traction;Moist Heat;Iontophoresis 4mg /ml Dexamethasone;Functional mobility training;Therapeutic activities;Therapeutic exercise;Manual techniques;Patient/family education;Passive range of motion;Dry needling;Joint Manipulations    Consulted and Agree with Plan of Care Patient             Patient will benefit from skilled therapeutic intervention in order to improve the following deficits and impairments:  Pain, Abnormal gait, Difficulty walking, Decreased activity tolerance, Decreased mobility, Decreased range of motion, Postural dysfunction  Visit Diagnosis: Acute right-sided low back pain with right-sided sciatica  Abnormal posture     Problem List Patient Active Problem List   Diagnosis Date Noted   Lumbar herniated disc 06/10/2021   Primary osteoarthritis involving multiple joints 07/20/2019   BMI 27.0-27.9,adult 07/20/2019   Depression, recurrent ([place]) 05/18/2019   GAD (generalized anxiety disorder) 05/18/2019   Vitamin D deficiency 05/04/2019   Chronic pain in right shoulder 05/04/2019   COPD (chronic obstructive pulmonary disease) ([place]) 06/20/2016   Coronary artery calcification 02/07/2015   BPH (benign prostatic hyperplasia) 23/30/0762   Metabolic syndrome 26/33/3545   Carotid artery stenosis 12/06/2013   Abnormal chest CT 08/17/2013   Hypertension 03/30/2013   Degenerative arthritis 03/30/2013   Hyperlipidemia 03/30/2013   Personal history of adenomatous colonic polyps - attenuated Polyposis syndrome 05/06/2012    Katrell Milhorn, [place], PT 11/18/2021, 4:00 PM  [facility] [address] [place], [place], [address] Phone: [phone]   Fax:  [phone]  Name: Joshua Bowman MRN: [id number] Date of Birth:  [birth year]

## 2021-11-21 ENCOUNTER — Ambulatory Visit: Payer: Medicare Other

## 2021-11-21 ENCOUNTER — Other Ambulatory Visit: Payer: Self-pay

## 2021-11-21 DIAGNOSIS — M5416 Radiculopathy, lumbar region: Secondary | ICD-10-CM | POA: Diagnosis not present

## 2021-11-21 DIAGNOSIS — R293 Abnormal posture: Secondary | ICD-10-CM

## 2021-11-21 DIAGNOSIS — M5441 Lumbago with sciatica, right side: Secondary | ICD-10-CM

## 2021-11-21 NOTE — Therapy (Signed)
[facility] [place], [place], [address] Phone: [phone]   Fax:  [phone]  Physical Therapy Treatment  Patient Details  Name: Joshua Bowman MRN: [id number] Date of Birth: [birth year] Referring Provider (PT): Claretta Fraise MD   Encounter Date: 11/21/2021   PT End of Session - 11/21/21 1354     Visit Number 8    Number of Visits 12    Date for PT Re-Evaluation 01/29/22    Authorization Type FOTO AT LEAST EVERY 5TH VISIT.  PROGRESS NOTE AT 10TH VISIT.  KX MODIFIER AFTER 15 VISITS.    PT Start Time 1345    PT Stop Time 1439    PT Time Calculation (min) 54 min    Activity Tolerance Patient tolerated treatment well    Behavior During Therapy [facility] for tasks assessed/performed             Past Medical History:  Diagnosis Date   Anxiety    Arthritis    Bilateral carotid artery stenosis    Minimal 2012    Colon polyps - attenuated polyposis syndrome    multiple    COPD (chronic obstructive pulmonary disease) (HCC)    Duodenitis    Gastritis    Hemorrhoids    Hyperlipidemia    Hyperplasia of prostate    IBS (irritable bowel syndrome)    Pre-diabetes    Sigmoid diverticulosis    mild     Past Surgical History:  Procedure Laterality Date   APPENDECTOMY  1969   COLONOSCOPY     ESOPHAGOGASTRODUODENOSCOPY     EYE SURGERY     laser - left eye - Dr Zigmund Daniel   lipoma rt shoulder  01/2001   outpatient    nasal surgery ( spurs )  70's   rt. shoulder -rotator cuff repair  08/2005   Dr. Maxie Better    TONSILLECTOMY      There were no vitals filed for this visit.   Subjective Assessment - 11/21/21 1353     Subjective Pt arrives for today's treatment sesison reporting 1.5-2/10 low back pain.    Pertinent History OA, right shoulder surgery, COPD.    How long can you walk comfortably? Up to 30 minutes.    Patient Stated Goals Get ot of pain and do more.    Currently in Pain? Yes    Pain Score 2     Pain Location  Back    Pain Orientation Right    Pain Onset More than a month ago                               [facility] Adult PT Treatment/Exercise - 11/21/21 0001       Exercises   Exercises Knee/Hip;Lumbar      Lumbar Exercises: Aerobic   Nustep Lvl 4 x 15 mins      Lumbar Exercises: Machines for Strengthening   Cybex Lumbar Extension 60# x 4 minutes.    Other Lumbar Machine Exercise Ab curls @ 60# x 4 minutes.      Knee/Hip Exercises: Seated   Long Arc Quad Both;20 reps;Weights    Long Arc Quad Weight 3 lbs.    Marching Both;20 reps;Weights    Marching Limitations 3    Hamstring Curl Both;20 reps    Hamstring Limitations red tband      Modalities   Modalities Electrical engineer  Stimulation Location Right low back    Electrical Stimulation Action IRC at 80-150 Hz    Electrical Stimulation Parameters 40% scan x 15 mins    Electrical Stimulation Goals Pain                          PT Long Term Goals - 11/11/21 1431       PT LONG TERM GOAL #1   Title Independent with a HEP.    Time 6    Period Weeks    Status On-going      PT LONG TERM GOAL #2   Title Eliminate radiation of symptoms into right thigh.    Time 6    Period Weeks    Status On-going      PT LONG TERM GOAL #3   Title Walk a community distance with pain not > 3/10.    Time 6    Period Weeks    Status On-going      PT LONG TERM GOAL #4   Title Perform ADL's with pain not > 3/10.    Time 6    Period Weeks    Status On-going                   Plan - 11/21/21 1354     Clinical Impression Statement Pt arrives for today's treatment session reporting 1.5-2/10 low back pain.  Pt opted to not perform traction during today's treatment session to see how his low back responds.  Pt instructed in various seated resisted exercises to increase strength and function.  Pt requiring min cues for proper technique and posture with good  carryover.  Normal responses to estim noted.  Pt reported 1/10 low back pain at completion of today's treatment session.    Personal Factors and Comorbidities Comorbidity 1;Other    Comorbidities OA, right shoulder surgery, COPD.    Examination-Activity Limitations Locomotion Level;Other    Examination-Participation Restrictions Other    Stability/Clinical Decision Making Stable/Uncomplicated    Rehab Potential Excellent    PT Frequency 2x / week    PT Duration 6 weeks    PT Treatment/Interventions ADLs/Self Care Home Management;Cryotherapy;Electrical Stimulation;Ultrasound;Traction;Moist Heat;Iontophoresis 4mg /ml Dexamethasone;Functional mobility training;Therapeutic activities;Therapeutic exercise;Manual techniques;Patient/family education;Passive range of motion;Dry needling;Joint Manipulations    Consulted and Agree with Plan of Care Patient             Patient will benefit from skilled therapeutic intervention in order to improve the following deficits and impairments:  Pain, Abnormal gait, Difficulty walking, Decreased activity tolerance, Decreased mobility, Decreased range of motion, Postural dysfunction  Visit Diagnosis: Acute right-sided low back pain with right-sided sciatica  Abnormal posture     Problem List Patient Active Problem List   Diagnosis Date Noted   Lumbar herniated disc 06/10/2021   Primary osteoarthritis involving multiple joints 07/20/2019   BMI 27.0-27.9,adult 07/20/2019   Depression, recurrent ([place]) 05/18/2019   GAD (generalized anxiety disorder) 05/18/2019   Vitamin D deficiency 05/04/2019   Chronic pain in right shoulder 05/04/2019   COPD (chronic obstructive pulmonary disease) ([place]) 06/20/2016   Coronary artery calcification 02/07/2015   BPH (benign prostatic hyperplasia) 82/42/3536   Metabolic syndrome 14/43/1540   Carotid artery stenosis 12/06/2013   Abnormal chest CT 08/17/2013   Hypertension 03/30/2013   Degenerative arthritis 03/30/2013    Hyperlipidemia 03/30/2013   Personal history of adenomatous colonic polyps - attenuated Polyposis syndrome 05/06/2012    Kathrynn Ducking, PTA 11/21/2021, 2:43 PM  [facility] [place], [place], [address] Phone: [phone]   Fax:  [phone]  Name: Joshua Bowman MRN: [id number] Date of Birth: December 12, 1945

## 2021-11-25 ENCOUNTER — Other Ambulatory Visit: Payer: Self-pay

## 2021-11-25 ENCOUNTER — Ambulatory Visit: Payer: Medicare Other

## 2021-11-25 DIAGNOSIS — R293 Abnormal posture: Secondary | ICD-10-CM

## 2021-11-25 DIAGNOSIS — M5441 Lumbago with sciatica, right side: Secondary | ICD-10-CM | POA: Diagnosis not present

## 2021-11-25 DIAGNOSIS — M5416 Radiculopathy, lumbar region: Secondary | ICD-10-CM | POA: Diagnosis not present

## 2021-11-25 NOTE — Therapy (Signed)
Joshua Bowman, [place], [address] Phone: [phone]   Fax:  [phone]  Physical Therapy Treatment  Patient Details  Name: Joshua Bowman MRN: [id number] Date of Birth: 1946-08-20 Referring Provider (PT): Claretta Fraise MD   Encounter Date: 11/25/2021   PT End of Session - 11/25/21 1426     Visit Number 9    Number of Visits 12    Date for PT Re-Evaluation 01/29/22    Authorization Type FOTO AT LEAST EVERY 5TH VISIT.  PROGRESS NOTE AT 10TH VISIT.  KX MODIFIER AFTER 15 VISITS.    PT Start Time 1423    PT Stop Time 1505    PT Time Calculation (min) 42 min    Activity Tolerance Patient tolerated treatment well    Behavior During Therapy [facility] for tasks assessed/performed             Past Medical History:  Diagnosis Date   Anxiety    Arthritis    Bilateral carotid artery stenosis    Minimal 2012    Colon polyps - attenuated polyposis syndrome    multiple    COPD (chronic obstructive pulmonary disease) (HCC)    Duodenitis    Gastritis    Hemorrhoids    Hyperlipidemia    Hyperplasia of prostate    IBS (irritable bowel syndrome)    Pre-diabetes    Sigmoid diverticulosis    mild     Past Surgical History:  Procedure Laterality Date   APPENDECTOMY  1969   COLONOSCOPY     ESOPHAGOGASTRODUODENOSCOPY     EYE SURGERY     laser - left eye - Dr Zigmund Daniel   lipoma rt shoulder  01/2001   outpatient    nasal surgery ( spurs )  70's   rt. shoulder -rotator cuff repair  08/2005   Dr. Maxie Better    TONSILLECTOMY      There were no vitals filed for this visit.   Subjective Assessment - 11/25/21 1425     Subjective Pt arrives for today's treatment sesison reporting 1/10 low back tightness.  Pt states that he is feeling much better.    Pertinent History OA, right shoulder surgery, COPD.    How long can you walk comfortably? Up to 30 minutes.    Patient Stated Goals Get ot of pain and do more.    Currently in  Pain? Yes    Pain Score 1     Pain Location Back    Pain Onset More than a month ago                               [facility] Adult PT Treatment/Exercise - 11/25/21 0001       Lumbar Exercises: Aerobic   Nustep Lvl 4 x 20 mins      Lumbar Exercises: Machines for Strengthening   Cybex Lumbar Extension 60# x 4 minutes.    Cybex Knee Extension 10# x 2 mins    Cybex Knee Flexion 30# x 2 mins    Other Lumbar Machine Exercise Ab curls @ 60# x 4 minutes.      Knee/Hip Exercises: Seated   Ball Squeeze 2 mins    Clamshell with TheraBand Red   2 mins                         PT Long Term Goals - 11/25/21 1432  PT LONG TERM GOAL #1   Title Independent with a HEP.    Time 6    Period Weeks    Status Achieved      PT LONG TERM GOAL #2   Title Eliminate radiation of symptoms into right thigh.    Baseline 11/25/21: hasn't experienced in over a week    Time 6    Period Weeks    Status Achieved      PT LONG TERM GOAL #3   Title Walk a community distance with pain not > 3/10.    Baseline 11/25/21: becomes tight with community distances    Time 6    Period Weeks    Status Partially Met      PT LONG TERM GOAL #4   Title Perform ADL's with pain not > 3/10.    Baseline 11/25/21: able to do "stuff around the house"    Time 6    Period Weeks    Status Achieved                   Plan - 11/25/21 1426     Clinical Impression Statement Pt arrives for today's treatment session reporting 1/10 low back pain.  Pt states that he is feeling much better since coming to therapy.  Pt able to met or partially met several of his goals at this time.  Pt opted to not perform traction or estim during today's treatment session.  Pt able to tolerate addition of cybex knee flexion and extension without increase in pain or discomfort.  Pt has decided to go on hold at this time for 2 weeks, until his next MD appointment, and will call the facility after that  appointment.  Pt denied any pain or tightness at completion of today's treatment session.    Personal Factors and Comorbidities Comorbidity 1;Other    Comorbidities OA, right shoulder surgery, COPD.    Examination-Activity Limitations Locomotion Level;Other    Examination-Participation Restrictions Other    Stability/Clinical Decision Making Stable/Uncomplicated    Rehab Potential Excellent    PT Frequency 2x / week    PT Duration 6 weeks    PT Treatment/Interventions ADLs/Self Care Home Management;Cryotherapy;Electrical Stimulation;Ultrasound;Traction;Moist Heat;Iontophoresis 75m/ml Dexamethasone;Functional mobility training;Therapeutic activities;Therapeutic exercise;Manual techniques;Patient/family education;Passive range of motion;Dry needling;Joint Manipulations    Consulted and Agree with Plan of Care Patient             Patient will benefit from skilled therapeutic intervention in order to improve the following deficits and impairments:  Pain, Abnormal gait, Difficulty walking, Decreased activity tolerance, Decreased mobility, Decreased range of motion, Postural dysfunction  Visit Diagnosis: Acute right-sided low back pain with right-sided sciatica  Abnormal posture     Problem List Patient Active Problem List   Diagnosis Date Noted   Lumbar herniated disc 06/10/2021   Primary osteoarthritis involving multiple joints 07/20/2019   BMI 27.0-27.9,adult 07/20/2019   Depression[place] recurrent [place] 05/18/2019   GAD (generalized anxiety disorder) 05/18/2019   Vitamin D deficiency 05/04/2019   Chronic pain in right shoulder 05/04/2019   COPD (chronic obstructive pulmonary disease) ([place] 06/20/2016   Coronary artery calcification 02/07/2015   BPH (benign prostatic hyperplasia) 093/79/0240  Metabolic syndrome 097/35/3299  Carotid artery stenosis 12/06/2013   Abnormal chest CT 08/17/2013   Hypertension 03/30/2013   Degenerative arthritis 03/30/2013   Hyperlipidemia 03/30/2013    Personal history of adenomatous colonic polyps - attenuated Polyposis syndrome 05/06/2012    JKathrynn Ducking PTA 11/25/2021, 3:10 PM  [facility] [place], [place], [address] Phone: [phone]   Fax:  [phone]  Name: Joshua Bowman MRN: [id number] Date of Birth: 1946-04-29

## 2021-12-04 ENCOUNTER — Other Ambulatory Visit: Payer: Self-pay | Admitting: Family Medicine

## 2021-12-05 ENCOUNTER — Ambulatory Visit (INDEPENDENT_AMBULATORY_CARE_PROVIDER_SITE_OTHER): Payer: Medicare Other | Admitting: Family Medicine

## 2021-12-05 ENCOUNTER — Encounter: Payer: Self-pay | Admitting: Family Medicine

## 2021-12-05 VITALS — BP 143/74 | HR 93 | Temp 98.0°F | Ht 66.0 in | Wt 167.0 lb

## 2021-12-05 DIAGNOSIS — I10 Essential (primary) hypertension: Secondary | ICD-10-CM | POA: Diagnosis not present

## 2021-12-05 DIAGNOSIS — M159 Polyosteoarthritis, unspecified: Secondary | ICD-10-CM

## 2021-12-05 MED ORDER — HYDROCODONE-ACETAMINOPHEN 5-325 MG PO TABS: 0.5000 | ORAL_TABLET | Freq: Four times a day (QID) | ORAL | 0 refills | Status: DC | PRN

## 2021-12-05 NOTE — Progress Notes (Signed)
? ?Subjective:  ?Patient ID: Joshua Bowman, male    DOB: [birth year]  Age: 76 y.o. MRN: [id number] ? ?CC: Follow-up ? ? ?HPI ?Joshua Bowman presents for BP & low back pain. Went to 9 sessions with PT.  Pain reduced to 1-2/10. Located at the right lateral L5 region without radiation. Stiff in the mornings. TENS helps. Good after he gets up and starts moving. Has delayed the MRI until finishing PT. Wants to see if pain rebounds. Using nabumetone prn. Using Lyrica 2 qhs ? ?PDMP review shows appropriate utilization of the patient's controlled medications. ?Using Hydrocodone for added relief, 1/2 tab QID. MME = 10/day.  ? ?Depression screen [facility] 2/9 12/05/2021 10/21/2021 10/21/2021  ?Decreased Interest 0 1 0  ?Down, Depressed, Hopeless 0 0 0  ?PHQ - 2 Score 0 1 0  ?Altered sleeping 1 0 -  ?Tired, decreased energy 0 1 -  ?Change in appetite 0 0 -  ?Feeling bad or failure about yourself  0 0 -  ?Trouble concentrating 0 0 -  ?Moving slowly or fidgety/restless 0 0 -  ?Suicidal thoughts 0 0 -  ?PHQ-9 Score 1 2 -  ?Difficult doing work/chores Not difficult at all Somewhat difficult -  ?Some recent data might be hidden  ? ? ?History ?Joshua Bowman has a past medical history of Anxiety, Arthritis, Bilateral carotid artery stenosis, Colon polyps - attenuated polyposis syndrome, COPD (chronic obstructive pulmonary disease) ([place]), Duodenitis, Gastritis, Hemorrhoids, Hyperlipidemia, Hyperplasia of prostate, IBS (irritable bowel syndrome), Pre-diabetes, and Sigmoid diverticulosis.  ? ?He has a past surgical history that includes Appendectomy (1969); nasal surgery ( spurs ) (70's); lipoma rt shoulder (01/2001); rt. shoulder -rotator cuff repair (08/2005); Colonoscopy; Esophagogastroduodenoscopy; Tonsillectomy; and Eye surgery.  ? ?His family history includes Arthritis in his sister; Congestive Heart Failure in his father; Heart attack in his brother; Heart disease in his brother and mother; Post-traumatic stress disorder in his father; Prostate  cancer in his brother.He reports that he has been smoking cigarettes. He started smoking about 41 years ago. He has a 40.00 pack-year smoking history. He has quit using smokeless tobacco. He reports that he does not currently use alcohol. He reports that he does not use drugs. ? ? ? ?ROS ?Review of Systems  ?Constitutional:  Negative for fever.  ?Respiratory:  Negative for shortness of breath.   ?Cardiovascular:  Negative for chest pain.  ?Musculoskeletal:  Positive for back pain. Negative for arthralgias.  ?Skin:  Negative for rash.  ? ?Objective:  ?BP (!) 143/74   Pulse 93   Temp 98 ?F (36.7 ?C)   Ht '5\' 6"'$  (1.676 m)   Wt 167 lb (75.8 kg)   SpO2 93%   BMI 26.95 kg/m?  ? ?BP Readings from Last 3 Encounters:  ?12/05/21 (!) 143/74  ?10/21/21 130/86  ?09/09/21 101/76  ? ? ?Wt Readings from Last 3 Encounters:  ?12/05/21 167 lb (75.8 kg)  ?10/21/21 168 lb 6.4 oz (76.4 kg)  ?09/09/21 168 lb 3.2 oz (76.3 kg)  ? ? ? ?Physical Exam ?Vitals reviewed.  ?Constitutional:   ?   Appearance: He is well-developed.  ?HENT:  ?   Head: Normocephalic and atraumatic.  ?   Right Ear: External ear normal.  ?   Left Ear: External ear normal.  ?   Mouth/Throat:  ?   Pharynx: No oropharyngeal exudate or posterior oropharyngeal erythema.  ?Eyes:  ?   Pupils: Pupils are equal, round, and reactive to light.  ?Cardiovascular:  ?   Rate  and Rhythm: Normal rate and regular rhythm.  ?   Heart sounds: No murmur heard. ?Pulmonary:  ?   Effort: No respiratory distress.  ?   Breath sounds: Normal breath sounds.  ?Musculoskeletal:  ?   Cervical back: Normal range of motion and neck supple.  ?Neurological:  ?   Mental Status: He is alert and oriented to person, place, and time.  ? ? ? ? ?Assessment & Plan:  ? ?Joshua Bowman was seen today for follow-up. ? ?Diagnoses and all orders for this visit: ? ?Osteoarthritis of multiple joints, unspecified osteoarthritis type ? ?Primary hypertension ? ?Other orders ?-     HYDROcodone-acetaminophen (NORCO/VICODIN)  5-325 MG tablet; Take 0.5 tablets by mouth every 6 (six) hours as needed for moderate pain. ?-     HYDROcodone-acetaminophen (NORCO/VICODIN) 5-325 MG tablet; Take 0.5 tablets by mouth every 6 (six) hours as needed for moderate pain. ?-     HYDROcodone-acetaminophen (NORCO/VICODIN) 5-325 MG tablet; Take 0.5 tablets by mouth every 6 (six) hours as needed for moderate pain. ? ? ? ? ? ? ?I am having Joshua Bowman start on HYDROcodone-acetaminophen and HYDROcodone-acetaminophen. I am also having him maintain his aspirin, Vitamin D3, diclofenac sodium, albuterol, fexofenadine, DULoxetine, nabumetone, pregabalin, Symbicort, tamsulosin, and HYDROcodone-acetaminophen. We will continue to administer betamethasone acetate-betamethasone sodium phosphate and betamethasone acetate-betamethasone sodium phosphate. ? ?Allergies as of 12/05/2021   ? ?   Reactions  ? Lipitor [atorvastatin] Other (See Comments)  ? myalgias  ? Sulfa Antibiotics Itching, Rash  ? ?  ? ?  ?Medication List  ?  ? ?  ? Accurate as of December 05, 2021  3:17 PM. If you have any questions, ask your nurse or doctor.  ?  ?  ? ?  ? ?albuterol 108 (90 Base) MCG/ACT inhaler ?Commonly known as: VENTOLIN HFA ?Inhale 2 puffs into the lungs every 4 (four) hours as needed for wheezing or shortness of breath. ?  ?aspirin 325 MG tablet ?Take 325 mg by mouth every other day. ?  ?diclofenac sodium 1 % Gel ?Commonly known as: Voltaren ?Apply 4 g topically 2 (two) times daily. ?  ?DULoxetine 60 MG capsule ?Commonly known as: CYMBALTA ?Take 1 capsule (60 mg total) by mouth 2 (two) times daily. ?  ?fexofenadine 180 MG tablet ?Commonly known as: ALLEGRA ?Take 1 tablet (180 mg total) by mouth daily. For allergy symptoms ?  ?HYDROcodone-acetaminophen 5-325 MG tablet ?Commonly known as: NORCO/VICODIN ?Take 0.5 tablets by mouth every 6 (six) hours as needed for moderate pain. ?Start taking on: December 13, 2021 ?What changed: These instructions start on December 13, 2021. If you are unsure  what to do until then, ask your doctor or other care provider. ?Changed by: Claretta Fraise, MD ?  ?HYDROcodone-acetaminophen 5-325 MG tablet ?Commonly known as: NORCO/VICODIN ?Take 0.5 tablets by mouth every 6 (six) hours as needed for moderate pain. ?Start taking on: January 10, 2022 ?What changed: You were already taking a medication with the same name, and this prescription was added. Make sure you understand how and when to take each. ?Changed by: Claretta Fraise, MD ?  ?HYDROcodone-acetaminophen 5-325 MG tablet ?Commonly known as: NORCO/VICODIN ?Take 0.5 tablets by mouth every 6 (six) hours as needed for moderate pain. ?Start taking on: Feb 11, 2022 ?What changed: You were already taking a medication with the same name, and this prescription was added. Make sure you understand how and when to take each. ?Changed by: Claretta Fraise, MD ?  ?nabumetone 500 MG tablet ?Commonly  known as: RELAFEN ?Take 2 tablets (1,000 mg total) by mouth 2 (two) times daily. For muscle and joint pain ?  ?pregabalin 50 MG capsule ?Commonly known as: LYRICA ?TAKE 3 AT BEDTIME X 7 DAYS, THEN 4 AT BEDTIME ?  ?Symbicort 160-4.5 MCG/ACT inhaler ?Generic drug: budesonide-formoterol ?INHALE 2 PUFFS INTO THE LUNGS TWICE A DAY ?  ?tamsulosin 0.4 MG Caps capsule ?Commonly known as: FLOMAX ?TAKE 2 CAPSULES (0.8 MG TOTAL) BY MOUTH AT BEDTIME. FOR URINE FLOW AND PROSTATE ?  ?Vitamin D3 125 MCG (5000 UT) Caps ?Take 1 tablet by mouth. 2-3 x week ?  ? ?  ? ? ? ?Follow-up: Return in about 3 months (around 03/07/2022). ? ?Claretta Fraise, M.D. ?

## 2021-12-12 DIAGNOSIS — B355 Tinea imbricata: Secondary | ICD-10-CM | POA: Diagnosis not present

## 2021-12-12 DIAGNOSIS — B078 Other viral warts: Secondary | ICD-10-CM | POA: Diagnosis not present

## 2021-12-18 IMAGING — DX DG LUMBAR SPINE 2-3V
2 series · 2 of 2 positions shown · non-contrast
Comparison: Lumbar spine radiograph dated 01/16/2017

CLINICAL DATA: Back pain.

EXAM:
LUMBAR SPINE - 2-3 VIEW

[l-spine ap]
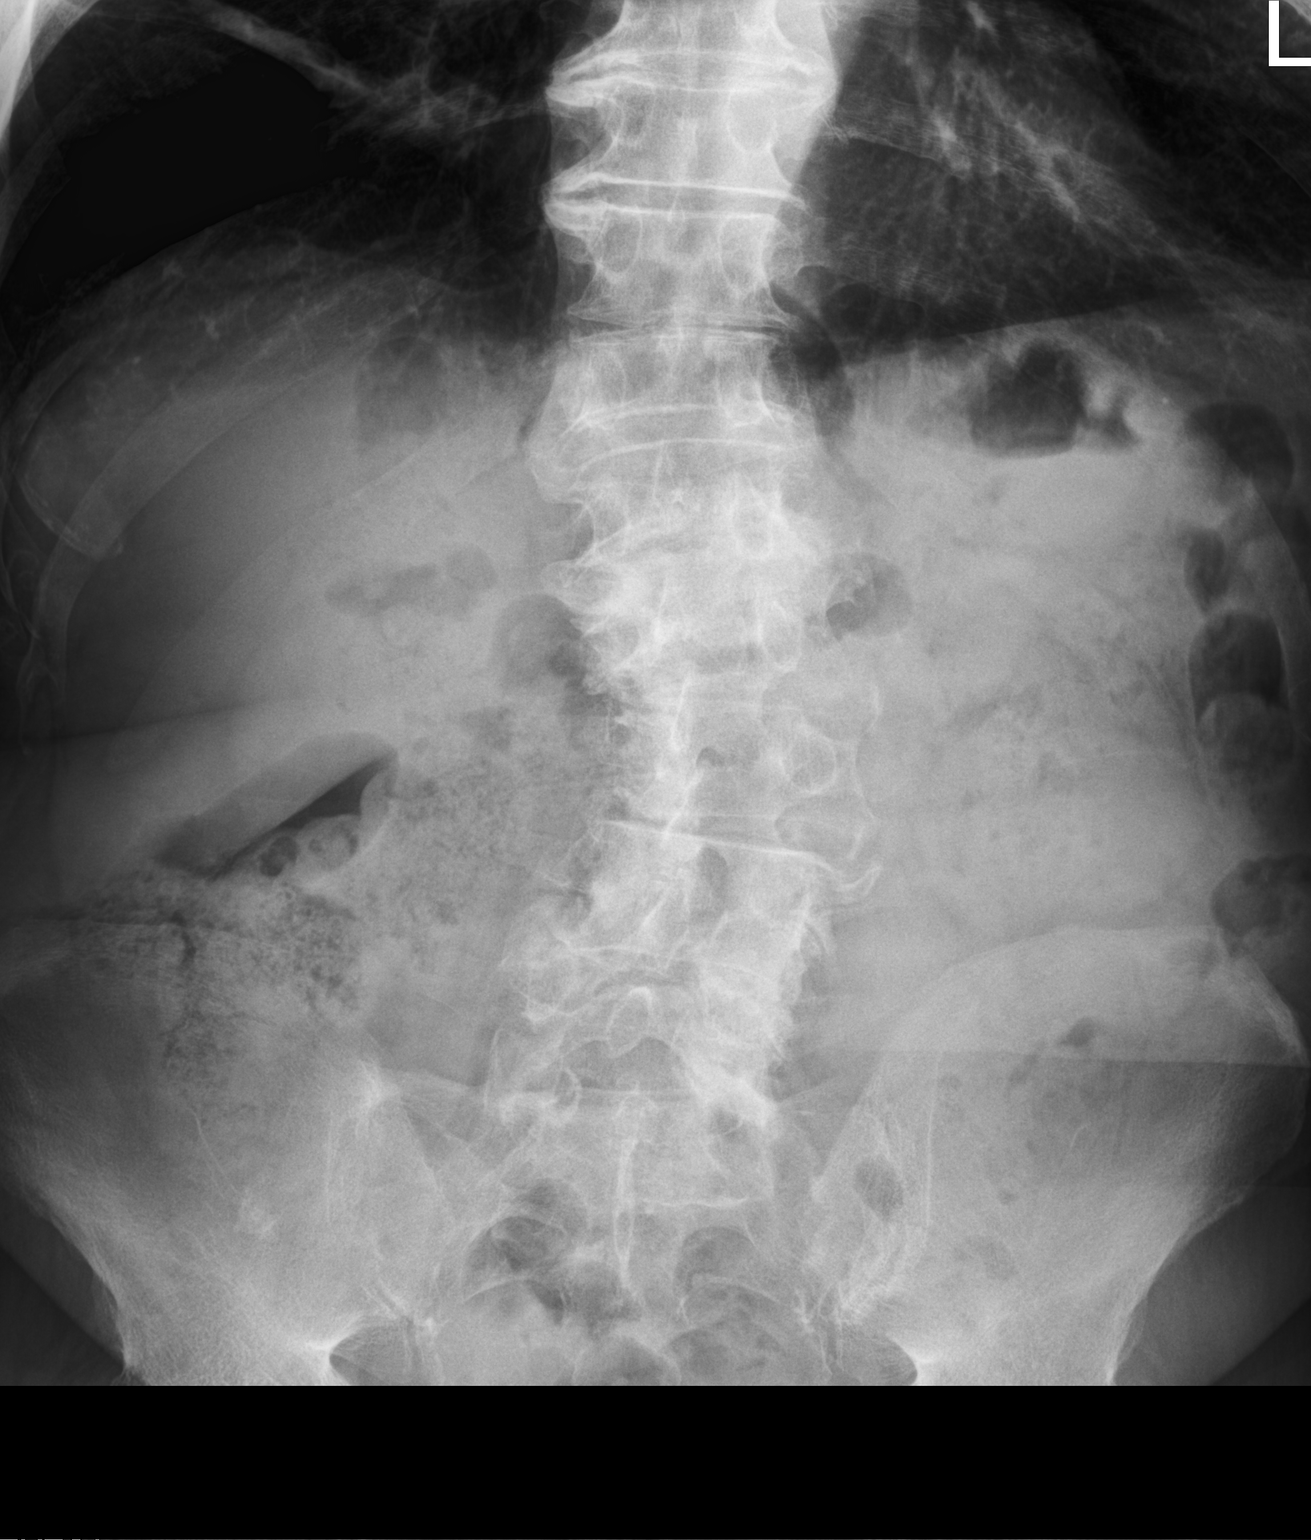

[l-spine lat]
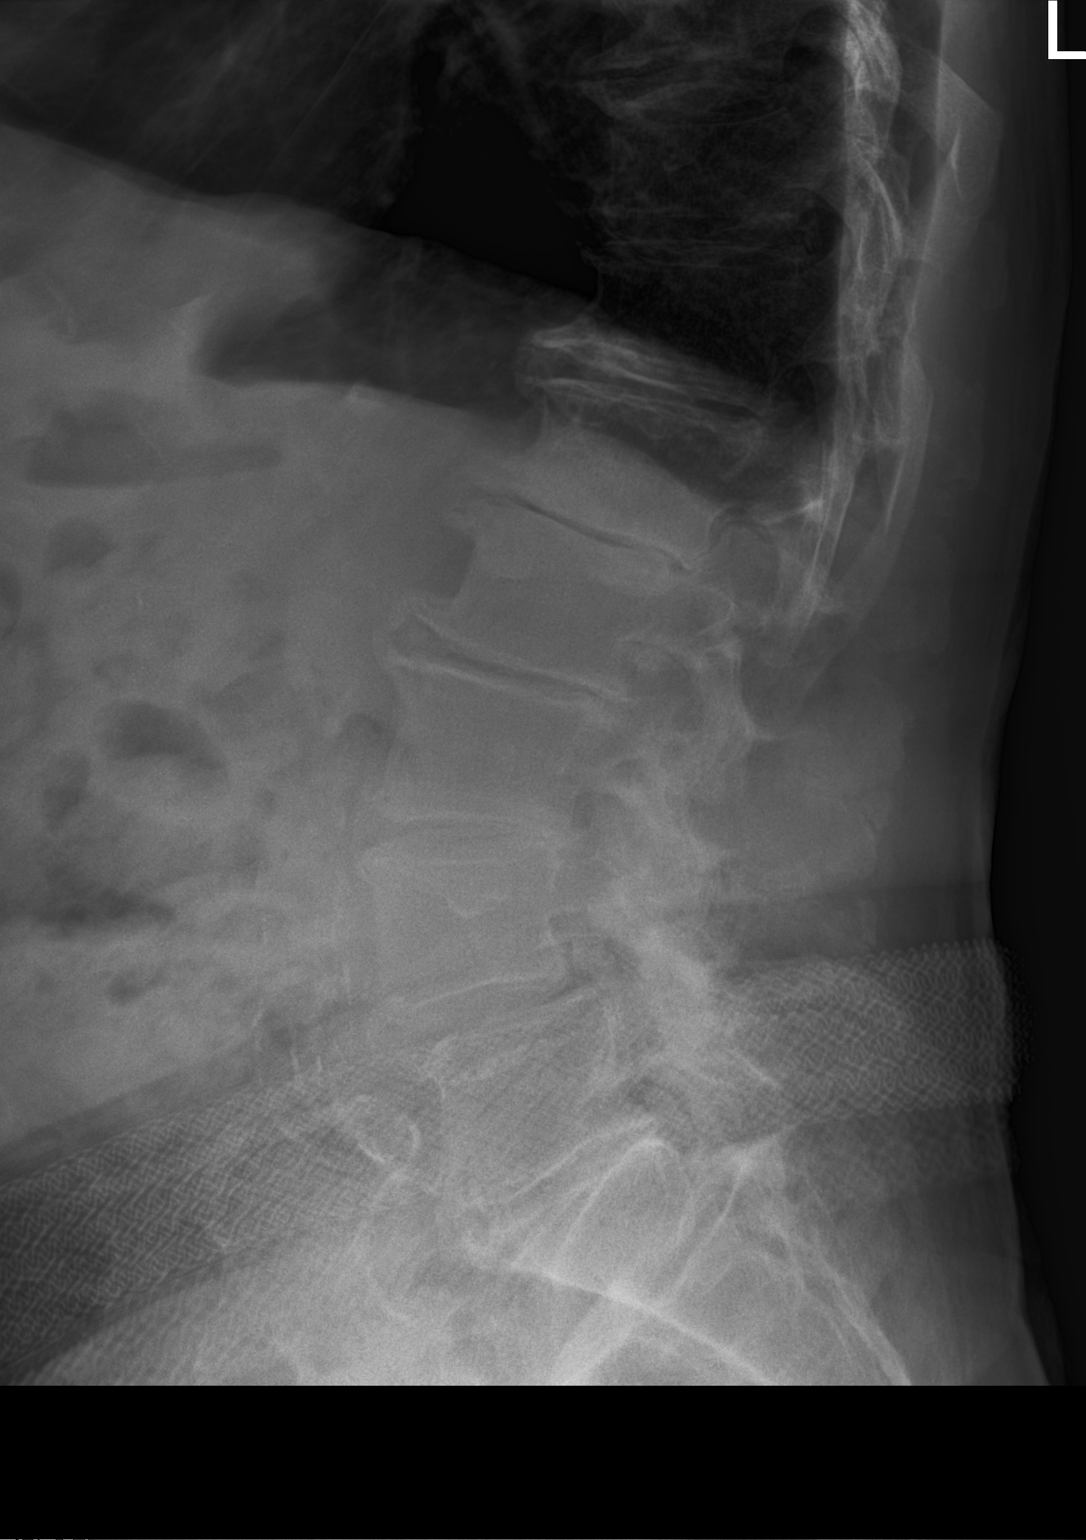

[2 of 2 positions shown; findings below may reference images not displayed]

FINDINGS: There is no acute fracture or subluxation of the lumbar spine.
Multilevel degenerative changes with disc space narrowing and
spurring and scoliosis. Multilevel facet arthropathy. Large amount
of stool throughout the colon. No bowel dilatation. The soft tissues
are unremarkable.
IMPRESSION: No acute/traumatic lumbar spine pathology.  Degenerative changes.

## 2022-02-27 ENCOUNTER — Ambulatory Visit (INDEPENDENT_AMBULATORY_CARE_PROVIDER_SITE_OTHER): Payer: Medicare Other

## 2022-02-27 VITALS — Wt 167.0 lb

## 2022-02-27 DIAGNOSIS — Z0000 Encounter for general adult medical examination without abnormal findings: Secondary | ICD-10-CM | POA: Diagnosis not present

## 2022-02-27 NOTE — Progress Notes (Signed)
Subjective:   Joshua Bowman is a 76 y.o. male who presents for Medicare Annual/Subsequent preventive examination.  Virtual Visit via Telephone Note  I connected with  Joshua Bowman on 02/27/22 at  1:15 PM EDT by telephone and verified that I am speaking with the correct person using two identifiers.  Location: Patient: Home Provider: [facility] Persons participating in the virtual visit: patient/Nurse Health Advisor   I discussed the limitations, risks, security and privacy concerns of performing an evaluation and management service by telephone and the availability of in person appointments. The patient expressed understanding and agreed to proceed.  Interactive audio and video telecommunications were attempted between this nurse and patient, however failed, due to patient having technical difficulties OR patient did not have access to video capability.  We continued and completed visit with audio only.  Some vital signs may be absent or patient reported.   Marlisa Caridi E Brevon Dewald, LPN   Review of Systems     Cardiac Risk Factors include: advanced age (>7mn, >>37women);sedentary lifestyle;dyslipidemia;hypertension;male gender;Other (see comment);smoking/ tobacco exposure, Risk factor comments: COPD     Objective:    Today's Vitals   02/27/22 1319  Weight: 167 lb (75.8 kg)   Body mass index is 26.95 kg/m.     02/27/2022    1:24 PM 10/30/2021    2:39 PM 02/26/2021    2:56 PM 02/23/2020    2:52 PM 02/16/2019    3:45 PM 11/19/2017    1:05 PM  Advanced Directives  Does Patient Have a Medical Advance Directive? No No No No No No  Would patient like information on creating a medical advance directive? No - Patient declined  Yes (MAU/Ambulatory/Procedural Areas - Information given) Yes (MAU/Ambulatory/Procedural Areas - Information given) No - Patient declined No - Patient declined    Current Medications (verified) Outpatient Encounter Medications as of 02/27/2022  Medication Sig   albuterol  (VENTOLIN HFA) 108 (90 Base) MCG/ACT inhaler Inhale 2 puffs into the lungs every 4 (four) hours as needed for wheezing or shortness of breath.   aspirin 325 MG tablet Take 325 mg by mouth every other day.    Cholecalciferol (VITAMIN D3) 5000 UNITS CAPS Take 1 tablet by mouth. 2-3 x week   diclofenac sodium (VOLTAREN) 1 % GEL Apply 4 g topically 2 (two) times daily.   DULoxetine (CYMBALTA) 60 MG capsule Take 1 capsule (60 mg total) by mouth 2 (two) times daily.   fexofenadine (ALLEGRA) 180 MG tablet Take 1 tablet (180 mg total) by mouth daily. For allergy symptoms   HYDROcodone-acetaminophen (NORCO/VICODIN) 5-325 MG tablet Take 0.5 tablets by mouth every 6 (six) hours as needed for moderate pain.   nabumetone (RELAFEN) 500 MG tablet Take 2 tablets (1,000 mg total) by mouth 2 (two) times daily. For muscle and joint pain   SYMBICORT 160-4.5 MCG/ACT inhaler INHALE 2 PUFFS INTO THE LUNGS TWICE A DAY   tamsulosin (FLOMAX) 0.4 MG CAPS capsule TAKE 2 CAPSULES (0.8 MG TOTAL) BY MOUTH AT BEDTIME. FOR URINE FLOW AND PROSTATE   HYDROcodone-acetaminophen (NORCO/VICODIN) 5-325 MG tablet Take 0.5 tablets by mouth every 6 (six) hours as needed for moderate pain.   HYDROcodone-acetaminophen (NORCO/VICODIN) 5-325 MG tablet Take 0.5 tablets by mouth every 6 (six) hours as needed for moderate pain.   pregabalin (LYRICA) 50 MG capsule TAKE 3 AT BEDTIME X 7 DAYS, THEN 4 AT BEDTIME (Patient not taking: Reported on 02/27/2022)   Facility-Administered Encounter Medications as of 02/27/2022  Medication   betamethasone  acetate-betamethasone sodium phosphate (CELESTONE) injection 6 mg   betamethasone acetate-betamethasone sodium phosphate (CELESTONE) injection 6 mg    Allergies (verified) Lipitor [atorvastatin] and Sulfa antibiotics   History: Past Medical History:  Diagnosis Date   Anxiety    Arthritis    Bilateral carotid artery stenosis    Minimal 2012    Colon polyps - attenuated polyposis syndrome    multiple     COPD (chronic obstructive pulmonary disease) (HCC)    Duodenitis    Gastritis    Hemorrhoids    Hyperlipidemia    Hyperplasia of prostate    IBS (irritable bowel syndrome)    Pre-diabetes    Sigmoid diverticulosis    mild    Past Surgical History:  Procedure Laterality Date   APPENDECTOMY  1969   COLONOSCOPY     ESOPHAGOGASTRODUODENOSCOPY     EYE SURGERY     laser - left eye - Dr Zigmund Daniel   lipoma rt shoulder  01/2001   outpatient    nasal surgery ( spurs )  70's   rt. shoulder -rotator cuff repair  08/2005   Dr. Maxie Better    TONSILLECTOMY     Family History  Problem Relation Age of Onset   Heart disease Mother        Atrial fib   Prostate cancer Brother    Heart disease Brother    Congestive Heart Failure Father    Post-traumatic stress disorder Father    Arthritis Sister        back issues    Heart attack Brother    Social History   Socioeconomic History   Marital status: Married    Spouse name: Quita Skye   Number of children: 1   Years of education: 15   Highest education level: Associate degree: occupational, Hotel manager, or vocational program  Occupational History   Occupation: retired     Comment: country side Loss adjuster, chartered   Tobacco Use   Smoking status: Every Day    Packs/day: 1.00    Years: 40.00    Pack years: 40.00    Types: Cigarettes    Start date: 09/29/1980   Smokeless tobacco: Former  Scientific laboratory technician Use: Never used  Substance and Sexual Activity   Alcohol use: Not Currently   Drug use: No   Sexual activity: Yes  Other Topics Concern   Not on file  Social History Narrative   Retired from a nursing home.  Lives with second wife.    Social Determinants of Health   Financial Resource Strain: Low Risk    Difficulty of Paying Living Expenses: Not hard at all  Food Insecurity: No Food Insecurity   Worried About Charity fundraiser in the Last Year: Never true   [place] in the Last Year: Never true  Transportation Needs: No  Transportation Needs   Lack of Transportation (Medical): No   Lack of Transportation (Non-Medical): No  Physical Activity: Insufficiently Active   Days of Exercise per Week: 7 days   Minutes of Exercise per Session: 20 min  Stress: No Stress Concern Present   Feeling of Stress : Only a little  Social Connections: Engineer, building services of Communication with Friends and Family: More than three times a week   Frequency of Social Gatherings with Friends and Family: More than three times a week   Attends Religious Services: 1 to 4 times per year   Active Member of [employer] or Organizations: Yes  Attends Archivist Meetings: 1 to 4 times per year   Marital Status: Married    Tobacco Counseling Ready to quit: Not Answered Counseling given: Not Answered   Clinical Intake:                 Diabetic? no         Activities of Daily Living    02/27/2022    1:32 PM  In your present state of health, do you have any difficulty performing the following activities:  Hearing? 0  Vision? 0  Difficulty concentrating or making decisions? 0  Walking or climbing stairs? 0  Dressing or bathing? 0  Doing errands, shopping? 0  Preparing Food and eating ? N  Using the Toilet? N  In the past six months, have you accidently leaked urine? Y  Comment better since starting Flomax  Do you have problems with loss of bowel control? N  Managing your Medications? N  Managing your Finances? N  Housekeeping or managing your Housekeeping? N    Patient Care Team: Claretta Fraise, MD as PCP - General (Family Medicine) Minus Breeding, MD as Consulting Physician (Cardiology) Susa Day, MD as Consulting Physician (Orthopedic Surgery) Gatha Mayer, MD as Consulting Physician (Gastroenterology) Harlen Labs, MD as Referring Physician (Optometry) Hayden Pedro, MD as Consulting Physician (Ophthalmology)  Indicate any recent Medical Services you may have received  from other than [facility] providers in the past year (date may be approximate).     Assessment:   This is a routine wellness examination for Aedan.  Hearing/Vision screen Hearing Screening - Comments:: Denies hearing difficulties   Vision Screening - Comments:: Wears reading glasses prn - up to date with routine eye exams with [facility]  Dietary issues and exercise activities discussed: Current Exercise Habits: Home exercise routine, Type of exercise: walking;Other - see comments (yard work), Time (Minutes): 20, Frequency (Times/Week): 7, Weekly Exercise (Minutes/Week): 140, Intensity: Mild, Exercise limited by: orthopedic condition(s);respiratory conditions(s)   Goals Addressed             This Visit's Progress    Exercise 3x per week (30 min per time)   On track    02/27/2022  AWV Goal: Exercise for General Health  Patient will verbalize understanding of the benefits of increased physical activity: Exercising regularly is important. It will improve your overall fitness, flexibility, and endurance. Regular exercise also will improve your overall health. It can help you control your weight, reduce stress, and improve your bone density. Over the next year, patient will increase physical activity as tolerated with a goal of at least 150 minutes of moderate physical activity per week.  You can tell that you are exercising at a moderate intensity if your heart starts beating faster and you start breathing faster but can still hold a conversation. Moderate-intensity exercise ideas include: Walking 1 mile (1.6 km) in about 15 minutes Biking Hiking Golfing Dancing Water aerobics Patient will verbalize understanding of everyday activities that increase physical activity by providing examples like the following: Yard work, such as: Sales promotion account executive Gardening Washing windows or floors Patient will be  able to explain general safety guidelines for exercising:  Before you start a new exercise program, talk with your health care provider. Do not exercise so much that you hurt yourself, feel dizzy, or get very short of breath. Wear comfortable clothes and wear shoes with good support.  Drink plenty of water while you exercise to prevent dehydration or heat stroke. Work out until your breathing and your heartbeat get faster.      Quit Smoking   Not on track    02/27/2022 AWV Goal: Tobacco Cessation  Smoking cessation instruction/counseling given:  counseled patient on the dangers of tobacco use, advised patient to stop smoking, and reviewed strategies to maximize success  Patient will verbalize understanding of the health risks associated with smoking/tobacco use Lung cancer or lung disease, such as COPD Heart disease. Stroke. Heart attack Infertility Osteoporosis and bone fractures. Patient will create a plan to quit smoking/using tobacco Pick a date to quit.  Write down the reasons why you are quitting and put it where you will see it often. Identify the people, places, things, and activities that make you want to smoke (triggers) and avoid them. Make sure to take these actions: Throw away all cigarettes at home, at work, and in your car. Throw away smoking accessories, such as Scientist, research (medical). Clean your car and make sure to empty the ashtray. Clean your home, including curtains and carpets. Tell your family, friends, and coworkers that you are quitting. Support from your loved ones can make quitting easier. Talk with your health care provider about your options for quitting smoking. Find out what treatment options are covered by your health insurance. Patient will be able to demonstrate knowledge of tobacco cessation strategies that may maximize success Quitting "cold [place]" is more successful than gradually quitting. Attending in-person counseling to help you build  problem-solving skills.  Finding resources and support systems that can help you to quit smoking and remain smoke-free after you quit. These resources are most helpful when you use them often. They can include: Online chats with a Social worker. Telephone quitlines. Printed Furniture conservator/restorer. Support groups or group counseling. Text messaging programs. Mobile phone applications. Taking medicines to help you quit smoking: Nicotine patches, gum, or lozenges. Nicotine inhalers or sprays. Non-nicotine medicine that is taken by mouth. Patient will note get discouraged if the process is difficult Over the next year, patient will stop smoking or using other forms of tobacco  Smoking cessation instruction/counseling given:  counseled patient on the dangers of tobacco use, advised patient to stop smoking, and reviewed strategies to maximize success         Depression Screen    02/27/2022    1:23 PM 12/05/2021    2:52 PM 10/21/2021    1:51 PM 10/21/2021    1:47 PM 09/09/2021    1:29 PM 09/09/2021   12:55 PM 08/21/2021    2:09 PM  PHQ 2/9 Scores  PHQ - 2 Score 1 0 1 0 1 0 1  PHQ- 9 Score  '1 2  2  3    '$ Fall Risk    10/21/2021    1:47 PM 09/09/2021   12:55 PM 08/21/2021    2:10 PM 06/10/2021    2:53 PM 03/08/2021    1:50 PM  Fall Risk   Falls in the past year? 0 0 0 0 0    FALL RISK PREVENTION PERTAINING TO THE HOME:  Any stairs in or around the home? No  If so, are there any without handrails? No  Home free of loose throw rugs in walkways, pet beds, electrical cords, etc? Yes  Adequate lighting in your home to reduce risk of falls? Yes   ASSISTIVE DEVICES UTILIZED TO PREVENT FALLS:  Life alert? No  Use of a cane, walker or  w/c? No  Grab bars in the bathroom? Yes  Shower chair or bench in shower? No  Elevated toilet seat or a handicapped toilet? No   TIMED UP AND GO:  Was the test performed? No . Telephonic visit  Cognitive Function:        02/27/2022    1:24 PM 02/23/2020     2:57 PM 02/16/2019    3:48 PM  6CIT Screen  What Year? 0 points 0 points 0 points  What month? 0 points 0 points 0 points  What time? 0 points 0 points 0 points  Count back from 20 0 points 0 points 0 points  Months in reverse 0 points 0 points 0 points  Repeat phrase 0 points 0 points 0 points  Total Score 0 points 0 points 0 points    Immunizations Immunization History  Administered Date(s) Administered   Fluad Quad(high Dose 65+) 07/20/2019, 06/12/2020, 09/09/2021   Influenza Whole 06/20/2015   Influenza, High Dose Seasonal PF 06/20/2016, 07/22/2018   Influenza,inj,Quad PF,6+ Mos 08/09/2014, 08/04/2017   Moderna Sars-Covid-2 Vaccination 11/23/2019, 12/21/2019, 10/09/2020, 02/19/2021   Pneumococcal Conjugate-13 12/06/2013   Pneumococcal Polysaccharide-23 11/19/2011   Td 12/03/2017    TDAP status: Up to date  Flu Vaccine status: Up to date  Pneumococcal vaccine status: Up to date  Covid-19 vaccine status: Completed vaccines  Qualifies for Shingles Vaccine? Yes   Zostavax completed No   Shingrix Completed?: No.    Education has been provided regarding the importance of this vaccine. Patient has been advised to call insurance company to determine out of pocket expense if they have not yet received this vaccine. Advised may also receive vaccine at local pharmacy or Health Dept. Verbalized acceptance and understanding.  Screening Tests Health Maintenance  Topic Date Due   Zoster Vaccines- Shingrix (1 of 2) Never done   COLONOSCOPY (Pts 45-24yr Insurance coverage will need to be confirmed)  11/19/2018   COVID-19 Vaccine (5 - Booster for Moderna series) 04/16/2021   INFLUENZA VACCINE  04/29/2022   TETANUS/TDAP  12/04/2027   Pneumonia Vaccine 76 Years old  Completed   Hepatitis C Screening  Completed   HPV VACCINES  Aged Out    Health Maintenance  Health Maintenance Due  Topic Date Due   Zoster Vaccines- Shingrix (1 of 2) Never done   COLONOSCOPY (Pts 45-464yr Insurance coverage will need to be confirmed)  11/19/2018   COVID-19 Vaccine (5 - Booster for Moderna series) 04/16/2021    Colorectal cancer screening: Type of screening: Colonoscopy. Completed 11/19/2017. Repeat every 1 years - patient has declined so far - he says he will discuss with Dr StLivia Snellent next visit in a couple weeks.  Lung Cancer Screening: (Low Dose CT Chest recommended if Age 76-80ears, 30 pack-year currently smoking OR have quit w/in 15years.) does qualify.   Lung Cancer Screening Referral: declines at this time - will discuss with Dr StLivia SnellenAdditional Screening:  Hepatitis C Screening: does qualify; Completed 03/16/2017  Vision Screening: Recommended annual ophthalmology exams for early detection of glaucoma and other disorders of the eye. Is the patient up to date with their annual eye exam?  Yes  Who is the provider or what is the name of the office in which the patient attends annual eye exams? [facility] not established with a provider, would they like to be referred to a provider to establish care? No .   Dental Screening: Recommended annual dental exams for proper oral hygiene  Community Resource Referral / Chronic Care Management: CRR required this visit?  No   CCM required this visit?  No      Plan:     I have personally reviewed and noted the following in the patient's chart:   Medical and social history Use of alcohol, tobacco or illicit drugs  Current medications and supplements including opioid prescriptions. Patient is currently taking opioid prescriptions. Information provided to patient regarding non-opioid alternatives. Patient advised to discuss non-opioid treatment plan with their provider. Functional ability and status Nutritional status Physical activity Advanced directives List of other physicians Hospitalizations, surgeries, and ER visits in previous 12 months Vitals Screenings to include cognitive, depression, and  falls Referrals and appointments  In addition, I have reviewed and discussed with patient certain preventive protocols, quality metrics, and best practice recommendations. A written personalized care plan for preventive services as well as general preventive health recommendations were provided to patient.     Sandrea Hammond, LPN   0/03/6225   Nurse Notes: None

## 2022-02-27 NOTE — Patient Instructions (Addendum)
Joshua Bowman , Thank you for taking time to come for your Medicare Wellness Visit. I appreciate your ongoing commitment to your health goals. Please review the following plan we discussed and let me know if I can assist you in the future.   Screening recommendations/referrals: Colonoscopy: Done 11/19/2017 - recommended repeat in 1 year - discuss with Dr Livia Snellen at next visit Recommended yearly ophthalmology/optometry visit for glaucoma screening and checkup Recommended yearly dental visit for hygiene and checkup  Vaccinations: Influenza vaccine: Done 09/09/2021 - Repeat annually Pneumococcal vaccine: Done 11/19/2011 & 12/06/2013 Tdap vaccine: Done 12/03/2017 - Repeat in 10 years Shingles vaccine: Due - Shingrix is 2 doses 2-6 months apart and over 90% effective     Covid-19: Done 11/23/2019, 12/21/2019, 10/09/2020, & 02/19/2021  Advanced directives: Advance directive discussed with you today. Even though you declined this today, please call our office should you change your mind, and we can give you the proper paperwork for you to fill out.   Conditions/risks identified: Aim for 30 minutes of exercise or brisk walking, 6-8 glasses of water, and 5 servings of fruits and vegetables each day.   If you wish to quit smoking, help is available. For free tobacco cessation program offerings call the [facility] at 8645857428 or Live Well Line at 2620312859. You may also visit www.[place].com or email livelifewell_0 .com for more information on other programs.   You may also call 1-800-QUIT-NOW 217-628-3421) or visit www.VirusCrisis.dk or www.BecomeAnEx.org for additional resources on smoking cessation.    Next appointment: Follow up in one year for your annual wellness visit.   Preventive Care 13 Years and Older, Male  Preventive care refers to lifestyle choices and visits with your health care provider that can promote health and wellness. What does preventive care  include? A yearly physical exam. This is also called an annual well check. Dental exams once or twice a year. Routine eye exams. Ask your health care provider how often you should have your eyes checked. Personal lifestyle choices, including: Daily care of your teeth and gums. Regular physical activity. Eating a healthy diet. Avoiding tobacco and drug use. Limiting alcohol use. Practicing safe sex. Taking low doses of aspirin every day. Taking vitamin and mineral supplements as recommended by your health care provider. What happens during an annual well check? The services and screenings done by your health care provider during your annual well check will depend on your age, overall health, lifestyle risk factors, and family history of disease. Counseling  Your health care provider may ask you questions about your: Alcohol use. Tobacco use. Drug use. Emotional well-being. Home and relationship well-being. Sexual activity. Eating habits. History of falls. Memory and ability to understand (cognition). Work and work Statistician. Screening  You may have the following tests or measurements: Height, weight, and BMI. Blood pressure. Lipid and cholesterol levels. These may be checked every 5 years, or more frequently if you are over 43 years old. Skin check. Lung cancer screening. You may have this screening every year starting at age 58 if you have a 30-pack-year history of smoking and currently smoke or have quit within the past 15 years. Fecal occult blood test (FOBT) of the stool. You may have this test every year starting at age 10. Flexible sigmoidoscopy or colonoscopy. You may have a sigmoidoscopy every 5 years or a colonoscopy every 10 years starting at age 18. Prostate cancer screening. Recommendations will vary depending on your family history and other risks. Hepatitis C  blood test. Hepatitis B blood test. Sexually transmitted disease (STD) testing. Diabetes screening. This  is done by checking your blood sugar (glucose) after you have not eaten for a while (fasting). You may have this done every 1-3 years. Abdominal aortic aneurysm (AAA) screening. You may need this if you are a current or former smoker. Osteoporosis. You may be screened starting at age 23 if you are at high risk. Talk with your health care provider about your test results, treatment options, and if necessary, the need for more tests. Vaccines  Your health care provider may recommend certain vaccines, such as: Influenza vaccine. This is recommended every year. Tetanus, diphtheria, and acellular pertussis (Tdap, Td) vaccine. You may need a Td booster every 10 years. Zoster vaccine. You may need this after age 109. Pneumococcal 13-valent conjugate (PCV13) vaccine. One dose is recommended after age 36. Pneumococcal polysaccharide (PPSV23) vaccine. One dose is recommended after age 41. Talk to your health care provider about which screenings and vaccines you need and how often you need them. This information is not intended to replace advice given to you by your health care provider. Make sure you discuss any questions you have with your health care provider. Document Released: 10/12/2015 Document Revised: 06/04/2016 Document Reviewed: 07/17/2015 Elsevier Interactive Patient Education  2017 [place] Prevention in the Home Falls can cause injuries. They can happen to people of all ages. There are many things you can do to make your home safe and to help prevent falls. What can I do on the outside of my home? Regularly fix the edges of walkways and driveways and fix any cracks. Remove anything that might make you trip as you walk through a door, such as a raised step or threshold. Trim any bushes or trees on the path to your home. Use bright outdoor lighting. Clear any walking paths of anything that might make someone trip, such as rocks or tools. Regularly check to see if handrails are  loose or broken. Make sure that both sides of any steps have handrails. Any raised decks and porches should have guardrails on the edges. Have any leaves, snow, or ice cleared regularly. Use sand or salt on walking paths during winter. Clean up any spills in your garage right away. This includes oil or grease spills. What can I do in the bathroom? Use night lights. Install grab bars by the toilet and in the tub and shower. Do not use towel bars as grab bars. Use non-skid mats or decals in the tub or shower. If you need to sit down in the shower, use a plastic, non-slip stool. Keep the floor dry. Clean up any water that spills on the floor as soon as it happens. Remove soap buildup in the tub or shower regularly. Attach bath mats securely with double-sided non-slip rug tape. Do not have throw rugs and other things on the floor that can make you trip. What can I do in the bedroom? Use night lights. Make sure that you have a light by your bed that is easy to reach. Do not use any sheets or blankets that are too big for your bed. They should not hang down onto the floor. Have a firm chair that has side arms. You can use this for support while you get dressed. Do not have throw rugs and other things on the floor that can make you trip. What can I do in the kitchen? Clean up any spills right away. Avoid walking on  wet floors. Keep items that you use a lot in easy-to-reach places. If you need to reach something above you, use a strong step stool that has a grab bar. Keep electrical cords out of the way. Do not use floor polish or wax that makes floors slippery. If you must use wax, use non-skid floor wax. Do not have throw rugs and other things on the floor that can make you trip. What can I do with my stairs? Do not leave any items on the stairs. Make sure that there are handrails on both sides of the stairs and use them. Fix handrails that are broken or loose. Make sure that handrails are as  long as the stairways. Check any carpeting to make sure that it is firmly attached to the stairs. Fix any carpet that is loose or worn. Avoid having throw rugs at the top or bottom of the stairs. If you do have throw rugs, attach them to the floor with carpet tape. Make sure that you have a light switch at the top of the stairs and the bottom of the stairs. If you do not have them, ask someone to add them for you. What else can I do to help prevent falls? Wear shoes that: Do not have high heels. Have rubber bottoms. Are comfortable and fit you well. Are closed at the toe. Do not wear sandals. If you use a stepladder: Make sure that it is fully opened. Do not climb a closed stepladder. Make sure that both sides of the stepladder are locked into place. Ask someone to hold it for you, if possible. Clearly mark and make sure that you can see: Any grab bars or handrails. First and last steps. Where the edge of each step is. Use tools that help you move around (mobility aids) if they are needed. These include: Canes. Walkers. Scooters. Crutches. Turn on the lights when you go into a dark area. Replace any light bulbs as soon as they burn out. Set up your furniture so you have a clear path. Avoid moving your furniture around. If any of your floors are uneven, fix them. If there are any pets around you, be aware of where they are. Review your medicines with your doctor. Some medicines can make you feel dizzy. This can increase your chance of falling. Ask your doctor what other things that you can do to help prevent falls. This information is not intended to replace advice given to you by your health care provider. Make sure you discuss any questions you have with your health care provider. Document Released: 07/12/2009 Document Revised: 02/21/2016 Document Reviewed: 10/20/2014 Elsevier Interactive Patient Education  2017 [employer]   Managing Pain Without Opioids Opioids are strong  medicines used to treat moderate to severe pain. For some people, especially those who have long-term (chronic) pain, opioids may not be the best choice for pain management due to: Side effects like nausea, constipation, and sleepiness. The risk of addiction (opioid use disorder). The longer you take opioids, the greater your risk of addiction. Pain that lasts for more than 3 months is called chronic pain. Managing chronic pain usually requires more than one approach and is often provided by a team of health care providers working together (multidisciplinary approach). Pain management may be done at a pain management center or pain clinic. How to manage pain without the use of opioids Use non-opioid medicines Non-opioid medicines for pain may include: Over-the-counter or prescription non-steroidal anti-inflammatory drugs (NSAIDs). These may be  the first medicines used for pain. They work well for muscle and bone pain, and they reduce swelling. Acetaminophen. This over-the-counter medicine may work well for milder pain but not swelling. Antidepressants. These may be used to treat chronic pain. A certain type of antidepressant (tricyclics) is often used. These medicines are given in lower doses for pain than when used for depression. Anticonvulsants. These are usually used to treat seizures but may also reduce nerve (neuropathic) pain. Muscle relaxants. These relieve pain caused by sudden muscle tightening (spasms). You may also use a pain medicine that is applied to the skin as a patch, cream, or gel (topical analgesic), such as a numbing medicine. These may cause fewer side effects than medicines taken by mouth. Do certain therapies as directed Some therapies can help with pain management. They include: Physical therapy. You will do exercises to gain strength and flexibility. A physical therapist may teach you exercises to move and stretch parts of your body that are weak, stiff, or painful. You can  learn these exercises at physical therapy visits and practice them at home. Physical therapy may also involve: Massage. Heat wraps or applying heat or cold to affected areas. Electrical signals that interrupt pain signals (transcutaneous electrical nerve stimulation, TENS). Weak lasers that reduce pain and swelling (low-level laser therapy). Signals from your body that help you learn to regulate pain (biofeedback). Occupational therapy. This helps you to learn ways to function at home and work with less pain. Recreational therapy. This involves trying new activities or hobbies, such as a physical activity or drawing. Mental health therapy, including: Cognitive behavioral therapy (CBT). This helps you learn coping skills for dealing with pain. Acceptance and commitment therapy (ACT) to change the way you think and react to pain. Relaxation therapies, including muscle relaxation exercises and mindfulness-based stress reduction. Pain management counseling. This may be individual, family, or group counseling.  Receive medical treatments Medical treatments for pain management include: Nerve block injections. These may include a pain blocker and anti-inflammatory medicines. You may have injections: Near the spine to relieve chronic back or neck pain. Into joints to relieve back or joint pain. Into nerve areas that supply a painful area to relieve body pain. Into muscles (trigger point injections) to relieve some painful muscle conditions. A medical device placed near your spine to help block pain signals and relieve nerve pain or chronic back pain (spinal cord stimulation device). Acupuncture. Follow these instructions at home Medicines Take over-the-counter and prescription medicines only as told by your health care provider. If you are taking pain medicine, ask your health care providers about possible side effects to watch out for. Do not drive or use heavy machinery while taking prescription  opioid pain medicine. Lifestyle  Do not use drugs or alcohol to reduce pain. If you drink alcohol, limit how much you have to: 0-1 drink a day for women who are not pregnant. 0-2 drinks a day for men. Know how much alcohol is in a drink. In the [place] one drink equals one 12 oz bottle of beer (355 mL), one 5 oz glass of wine (148 mL), or one 1 oz glass of hard liquor (44 mL). Do not use any products that contain nicotine or tobacco. These products include cigarettes, chewing tobacco, and vaping devices, such as e-cigarettes. If you need help quitting, ask your health care provider. Eat a healthy diet and maintain a healthy weight. Poor diet and excess weight may make pain worse. Eat foods that are high in  fiber. These include fresh fruits and vegetables, whole grains, and beans. Limit foods that are high in fat and processed sugars, such as fried and sweet foods. Exercise regularly. Exercise lowers stress and may help relieve pain. Ask your health care provider what activities and exercises are safe for you. If your health care provider approves, join an exercise class that combines movement and stress reduction. Examples include yoga and tai chi. Get enough sleep. Lack of sleep may make pain worse. Lower stress as much as possible. Practice stress reduction techniques as told by your therapist. General instructions Work with all your pain management providers to find the treatments that work best for you. You are an important member of your pain management team. There are many things you can do to reduce pain on your own. Consider joining an online or in-person support group for people who have chronic pain. Keep all follow-up visits. This is important. Where to find more information You can find more information about managing pain without opioids from: American Academy of Pain Medicine: painmed.[place] [facility]: instituteforchronicpain.org American Chronic Pain Association:  theacpa.org Contact a health care provider if: You have side effects from pain medicine. Your pain gets worse or does not get better with treatments or home therapy. You are struggling with anxiety or depression. Summary Many types of pain can be managed without opioids. Chronic pain may respond better to pain management without opioids. Pain is best managed when you and a team of health care providers work together. Pain management without opioids may include non-opioid medicines, medical treatments, physical therapy, mental health therapy, and lifestyle changes. Tell your health care providers if your pain gets worse or is not being managed well enough. This information is not intended to replace advice given to you by your health care provider. Make sure you discuss any questions you have with your health care provider. Document Revised: 12/26/2020 Document Reviewed: 12/26/2020 Elsevier Patient Education  [place].

## 2022-03-13 ENCOUNTER — Encounter: Payer: Self-pay | Admitting: Family Medicine

## 2022-03-13 ENCOUNTER — Ambulatory Visit (INDEPENDENT_AMBULATORY_CARE_PROVIDER_SITE_OTHER): Payer: Medicare Other | Admitting: Family Medicine

## 2022-03-13 VITALS — BP 126/80 | HR 96 | Temp 98.0°F | Ht 66.0 in | Wt 161.8 lb

## 2022-03-13 DIAGNOSIS — G8929 Other chronic pain: Secondary | ICD-10-CM | POA: Diagnosis not present

## 2022-03-13 DIAGNOSIS — E559 Vitamin D deficiency, unspecified: Secondary | ICD-10-CM | POA: Diagnosis not present

## 2022-03-13 DIAGNOSIS — I10 Essential (primary) hypertension: Secondary | ICD-10-CM

## 2022-03-13 DIAGNOSIS — M25511 Pain in right shoulder: Secondary | ICD-10-CM | POA: Diagnosis not present

## 2022-03-13 DIAGNOSIS — R351 Nocturia: Secondary | ICD-10-CM

## 2022-03-13 DIAGNOSIS — E785 Hyperlipidemia, unspecified: Secondary | ICD-10-CM | POA: Diagnosis not present

## 2022-03-13 DIAGNOSIS — M170 Bilateral primary osteoarthritis of knee: Secondary | ICD-10-CM | POA: Diagnosis not present

## 2022-03-13 DIAGNOSIS — F411 Generalized anxiety disorder: Secondary | ICD-10-CM

## 2022-03-13 DIAGNOSIS — N401 Enlarged prostate with lower urinary tract symptoms: Secondary | ICD-10-CM

## 2022-03-13 MED ORDER — BUDESONIDE-FORMOTEROL FUMARATE 160-4.5 MCG/ACT IN AERO: INHALATION_SPRAY | RESPIRATORY_TRACT | 5 refills | Status: DC

## 2022-03-13 MED ORDER — SILODOSIN 8 MG PO CAPS: 8.0000 mg | ORAL_CAPSULE | Freq: Every day | ORAL | 1 refills | Status: DC

## 2022-03-13 MED ORDER — HYDROCODONE-ACETAMINOPHEN 5-325 MG PO TABS: 0.5000 | ORAL_TABLET | Freq: Four times a day (QID) | ORAL | 0 refills | Status: DC | PRN

## 2022-03-13 NOTE — Progress Notes (Signed)
Subjective:  Patient ID: Joshua Bowman, male    DOB: Nov 12, 1945  Age: 76 y.o. MRN: [id number]  CC: Medical Management of Chronic Issues   HPI DAMARIOUS HOLTSCLAW presents for  follow-up of hypertension. Patient has no history of headache chest pain or shortness of breath or recent cough. Patient also denies symptoms of TIA such as focal numbness or weakness. Patient denies side effects from medication. States taking it regularly.  Still up to urinate 3-4 times a night.Lyrica caused AM grogginess.   Nabumetone helping back. Not a hard pain.  KNees still hurt. Hydrocodone helps at bedtime. Hard to get up & down steps  PDMP review shows appropriate utilization of the patient's controlled medications.  History Cecil has a past medical history of Anxiety, Arthritis, Bilateral carotid artery stenosis, Colon polyps - attenuated polyposis syndrome, COPD (chronic obstructive pulmonary disease) ([place]), Duodenitis, Gastritis, Hemorrhoids, Hyperlipidemia, Hyperplasia of prostate, IBS (irritable bowel syndrome), Pre-diabetes, and Sigmoid diverticulosis.   He has a past surgical history that includes Appendectomy (1969); nasal surgery ( spurs ) (70's); lipoma rt shoulder (01/2001); rt. shoulder -rotator cuff repair (08/2005); Colonoscopy; Esophagogastroduodenoscopy; Tonsillectomy; and Eye surgery.   His family history includes Arthritis in his sister; Congestive Heart Failure in his father; Heart attack in his brother; Heart disease in his brother and mother; Post-traumatic stress disorder in his father; Prostate cancer in his brother.He reports that he has been smoking cigarettes. He started smoking about 41 years ago. He has a 40.00 pack-year smoking history. He has quit using smokeless tobacco. He reports that he does not currently use alcohol. He reports that he does not use drugs.  Current Outpatient Medications on File Prior to Visit  Medication Sig Dispense Refill   albuterol (VENTOLIN HFA) 108 (90  Base) MCG/ACT inhaler Inhale 2 puffs into the lungs every 4 (four) hours as needed for wheezing or shortness of breath. 18 g 4   aspirin 325 MG tablet Take 325 mg by mouth every other day.      Cholecalciferol (VITAMIN D3) 5000 UNITS CAPS Take 1 tablet by mouth. 2-3 x week     diclofenac sodium (VOLTAREN) 1 % GEL Apply 4 g topically 2 (two) times daily. 100 g 2   DULoxetine (CYMBALTA) 60 MG capsule Take 1 capsule (60 mg total) by mouth 2 (two) times daily. 180 capsule 3   fexofenadine (ALLEGRA) 180 MG tablet Take 1 tablet (180 mg total) by mouth daily. For allergy symptoms 30 tablet 11   nabumetone (RELAFEN) 500 MG tablet Take 2 tablets (1,000 mg total) by mouth 2 (two) times daily. For muscle and joint pain 360 tablet 1   Current Facility-Administered Medications on File Prior to Visit  Medication Dose Route Frequency Provider Last Rate Last Admin   betamethasone acetate-betamethasone sodium phosphate (CELESTONE) injection 6 mg  6 mg Intramuscular Once Claretta Fraise, MD       betamethasone acetate-betamethasone sodium phosphate (CELESTONE) injection 6 mg  6 mg Intramuscular Once Claretta Fraise, MD        ROS Review of Systems  Objective:  BP 126/80   Pulse 96   Temp 98 F (36.7 C)   Ht _0  (1.676 m)   Wt 161 lb 12.8 oz (73.4 kg)   SpO2 94%   BMI 26.12 kg/m   BP Readings from Last 3 Encounters:  03/13/22 126/80  12/05/21 (!) 143/74  10/21/21 130/86    Wt Readings from Last 3 Encounters:  03/13/22 161 lb 12.8 oz (73.4 kg)  02/27/22 167 lb (75.8 kg)  12/05/21 167 lb (75.8 kg)     Physical Exam    Assessment & Plan:   Tito was seen today for medical management of chronic issues.  Diagnoses and all orders for this visit:  Primary hypertension -     CMP14+EGFR -     CBC with Differential/Platelet  Vitamin D deficiency -     VITAMIN D 25 Hydroxy (Vit-D Deficiency, Fractures)  Hyperlipidemia, unspecified hyperlipidemia type -     Lipid panel  Chronic pain in  right shoulder  Benign prostatic hyperplasia with nocturia  GAD (generalized anxiety disorder)  Primary osteoarthritis of both knees  Other orders -     HYDROcodone-acetaminophen (NORCO/VICODIN) 5-325 MG tablet; Take 0.5 tablets by mouth every 6 (six) hours as needed for moderate pain. -     HYDROcodone-acetaminophen (NORCO/VICODIN) 5-325 MG tablet; Take 0.5 tablets by mouth every 6 (six) hours as needed for moderate pain. -     HYDROcodone-acetaminophen (NORCO/VICODIN) 5-325 MG tablet; Take 0.5 tablets by mouth every 6 (six) hours as needed for moderate pain. -     budesonide-formoterol (SYMBICORT) 160-4.5 MCG/ACT inhaler; INHALE 2 PUFFS INTO THE LUNGS TWICE A DAY -     silodosin (RAPAFLO) 8 MG CAPS capsule; Take 1 capsule (8 mg total) by mouth daily with breakfast.   Allergies as of 03/13/2022       Reactions   Lipitor [atorvastatin] Other (See Comments)   myalgias   Sulfa Antibiotics Itching, Rash        Medication List        Accurate as of March 13, 2022  1:19 PM. If you have any questions, ask your nurse or doctor.          STOP taking these medications    pregabalin 50 MG capsule Commonly known as: LYRICA Stopped by: Claretta Fraise, MD   tamsulosin 0.4 MG Caps capsule Commonly known as: FLOMAX Stopped by: Claretta Fraise, MD       TAKE these medications    albuterol 108 (90 Base) MCG/ACT inhaler Commonly known as: VENTOLIN HFA Inhale 2 puffs into the lungs every 4 (four) hours as needed for wheezing or shortness of breath.   aspirin 325 MG tablet Take 325 mg by mouth every other day.   budesonide-formoterol 160-4.5 MCG/ACT inhaler Commonly known as: Symbicort INHALE 2 PUFFS INTO THE LUNGS TWICE A DAY   diclofenac sodium 1 % Gel Commonly known as: Voltaren Apply 4 g topically 2 (two) times daily.   DULoxetine 60 MG capsule Commonly known as: CYMBALTA Take 1 capsule (60 mg total) by mouth 2 (two) times daily.   fexofenadine 180 MG tablet Commonly  known as: ALLEGRA Take 1 tablet (180 mg total) by mouth daily. For allergy symptoms   HYDROcodone-acetaminophen 5-325 MG tablet Commonly known as: NORCO/VICODIN Take 0.5 tablets by mouth every 6 (six) hours as needed for moderate pain. What changed: Another medication with the same name was changed. Make sure you understand how and when to take each. Changed by: Claretta Fraise, MD   HYDROcodone-acetaminophen 5-325 MG tablet Commonly known as: NORCO/VICODIN Take 0.5 tablets by mouth every 6 (six) hours as needed for moderate pain. Start taking on: April 12, 2022 What changed: These instructions start on April 12, 2022. If you are unsure what to do until then, ask your doctor or other care provider. Changed by: Claretta Fraise, MD   HYDROcodone-acetaminophen 5-325 MG tablet Commonly known as: NORCO/VICODIN Take 0.5 tablets by mouth every  6 (six) hours as needed for moderate pain. Start taking on: May 12, 2022 What changed: These instructions start on May 12, 2022. If you are unsure what to do until then, ask your doctor or other care provider. Changed by: Claretta Fraise, MD   nabumetone 500 MG tablet Commonly known as: RELAFEN Take 2 tablets (1,000 mg total) by mouth 2 (two) times daily. For muscle and joint pain   silodosin 8 MG Caps capsule Commonly known as: RAPAFLO Take 1 capsule (8 mg total) by mouth daily with breakfast. Started by: Claretta Fraise, MD   Vitamin D3 125 MCG (5000 UT) Caps Take 1 tablet by mouth. 2-3 x week        Meds ordered this encounter  Medications   HYDROcodone-acetaminophen (NORCO/VICODIN) 5-325 MG tablet    Sig: Take 0.5 tablets by mouth every 6 (six) hours as needed for moderate pain.    Dispense:  60 tablet    Refill:  0   HYDROcodone-acetaminophen (NORCO/VICODIN) 5-325 MG tablet    Sig: Take 0.5 tablets by mouth every 6 (six) hours as needed for moderate pain.    Dispense:  60 tablet    Refill:  0   HYDROcodone-acetaminophen  (NORCO/VICODIN) 5-325 MG tablet    Sig: Take 0.5 tablets by mouth every 6 (six) hours as needed for moderate pain.    Dispense:  60 tablet    Refill:  0   budesonide-formoterol (SYMBICORT) 160-4.5 MCG/ACT inhaler    Sig: INHALE 2 PUFFS INTO THE LUNGS TWICE A DAY    Dispense:  10.2 each    Refill:  5   silodosin (RAPAFLO) 8 MG CAPS capsule    Sig: Take 1 capsule (8 mg total) by mouth daily with breakfast.    Dispense:  90 capsule    Refill:  1      Follow-up: Return in about 3 months (around 06/13/2022). Claretta Fraise, M.D.

## 2022-05-13 ENCOUNTER — Telehealth: Payer: Self-pay | Admitting: Family Medicine

## 2022-05-13 MED ORDER — DULOXETINE HCL 60 MG PO CPEP: 60.0000 mg | ORAL_CAPSULE | Freq: Two times a day (BID) | ORAL | 0 refills | Status: DC

## 2022-05-13 NOTE — Telephone Encounter (Signed)
  Prescription Request  05/13/2022  Is this a "Controlled Substance" medicine? NO  Have you seen your PCP in the last 2 weeks? NO PT has appt with PCP in september  If YES, route message to pool  -  If NO, patient needs to be scheduled for appointment.  What is the name of the medication or equipment? DULoxetine (CYMBALTA) 60 MG capsule  Have you contacted your pharmacy to request a refill? yes Which pharmacy would you like this sent to? [facility]    Patient notified that their request is being sent to the clinical staff for review and that they should receive a response within 2 business days.

## 2022-05-13 NOTE — Telephone Encounter (Signed)
Refill sent to [facility], patient aware

## 2022-06-10 ENCOUNTER — Ambulatory Visit (INDEPENDENT_AMBULATORY_CARE_PROVIDER_SITE_OTHER): Payer: Medicare Other | Admitting: Family Medicine

## 2022-06-10 ENCOUNTER — Encounter: Payer: Self-pay | Admitting: Family Medicine

## 2022-06-10 VITALS — BP 138/69 | HR 87 | Temp 98.3°F | Ht 66.0 in | Wt 157.4 lb

## 2022-06-10 DIAGNOSIS — Z79899 Other long term (current) drug therapy: Secondary | ICD-10-CM | POA: Diagnosis not present

## 2022-06-10 DIAGNOSIS — I10 Essential (primary) hypertension: Secondary | ICD-10-CM

## 2022-06-10 DIAGNOSIS — J418 Mixed simple and mucopurulent chronic bronchitis: Secondary | ICD-10-CM

## 2022-06-10 DIAGNOSIS — F17200 Nicotine dependence, unspecified, uncomplicated: Secondary | ICD-10-CM | POA: Diagnosis not present

## 2022-06-10 DIAGNOSIS — Z122 Encounter for screening for malignant neoplasm of respiratory organs: Secondary | ICD-10-CM | POA: Diagnosis not present

## 2022-06-10 DIAGNOSIS — M7122 Synovial cyst of popliteal space [Baker], left knee: Secondary | ICD-10-CM

## 2022-06-10 DIAGNOSIS — M5126 Other intervertebral disc displacement, lumbar region: Secondary | ICD-10-CM

## 2022-06-10 MED ORDER — HYDROCODONE-ACETAMINOPHEN 5-325 MG PO TABS: 0.5000 | ORAL_TABLET | Freq: Four times a day (QID) | ORAL | 0 refills | Status: DC | PRN

## 2022-06-10 MED ORDER — ALBUTEROL SULFATE HFA 108 (90 BASE) MCG/ACT IN AERS: 2.0000 | INHALATION_SPRAY | RESPIRATORY_TRACT | 4 refills | Status: DC | PRN

## 2022-06-10 NOTE — Progress Notes (Signed)
Subjective:  Patient ID: Joshua Bowman, male    DOB: Apr 26, 1946  Age: 76 y.o. MRN: [id number]  CC: Medical Management of Chronic Issues   HPI Joshua Bowman presents for  follow-up of hypertension. Patient has no history of headache chest pain or shortness of breath or recent cough. Patient also denies symptoms of TIA such as focal numbness or weakness. Patient denies side effects from medication. States taking it regularly.  Continues to have lumbar neuropathic pain for which he is opiate dependent.  PDMP review shows appropriate utilization of the patient's controlled medications. 10 mme/day.   Still smoking, 40 + years, 1ppd  History Joshua Bowman has a past medical history of Anxiety, Arthritis, Bilateral carotid artery stenosis, Colon polyps - attenuated polyposis syndrome, COPD (chronic obstructive pulmonary disease) ([place]), Duodenitis, Gastritis, Hemorrhoids, Hyperlipidemia, Hyperplasia of prostate, IBS (irritable bowel syndrome), Pre-diabetes, and Sigmoid diverticulosis.   He has a past surgical history that includes Appendectomy (1969); nasal surgery ( spurs ) (70's); lipoma rt shoulder (01/2001); rt. shoulder -rotator cuff repair (08/2005); Colonoscopy; Esophagogastroduodenoscopy; Tonsillectomy; and Eye surgery.   His family history includes Arthritis in his sister; Congestive Heart Failure in his father; Heart attack in his brother; Heart disease in his brother and mother; Post-traumatic stress disorder in his father; Prostate cancer in his brother.He reports that he has been smoking cigarettes. He started smoking about 41 years ago. He has a 40.00 pack-year smoking history. He has quit using smokeless tobacco. He reports that he does not currently use alcohol. He reports that he does not use drugs.  Current Outpatient Medications on File Prior to Visit  Medication Sig Dispense Refill   aspirin 325 MG tablet Take 325 mg by mouth every other day.      budesonide-formoterol (SYMBICORT)  160-4.5 MCG/ACT inhaler INHALE 2 PUFFS INTO THE LUNGS TWICE A DAY 10.2 each 5   Cholecalciferol (VITAMIN D3) 5000 UNITS CAPS Take 1 tablet by mouth. 2-3 x week     diclofenac sodium (VOLTAREN) 1 % GEL Apply 4 g topically 2 (two) times daily. 100 g 2   DULoxetine (CYMBALTA) 60 MG capsule Take 1 capsule (60 mg total) by mouth 2 (two) times daily. 180 capsule 0   fexofenadine (ALLEGRA) 180 MG tablet Take 1 tablet (180 mg total) by mouth daily. For allergy symptoms 30 tablet 11   nabumetone (RELAFEN) 500 MG tablet Take 2 tablets (1,000 mg total) by mouth 2 (two) times daily. For muscle and joint pain 360 tablet 1   silodosin (RAPAFLO) 8 MG CAPS capsule Take 1 capsule (8 mg total) by mouth daily with breakfast. 90 capsule 1   Current Facility-Administered Medications on File Prior to Visit  Medication Dose Route Frequency Provider Last Rate Last Admin   betamethasone acetate-betamethasone sodium phosphate (CELESTONE) injection 6 mg  6 mg Intramuscular Once Claretta Fraise, MD       betamethasone acetate-betamethasone sodium phosphate (CELESTONE) injection 6 mg  6 mg Intramuscular Once Claretta Fraise, MD        ROS Review of Systems  Constitutional:  Negative for fever.  Respiratory:  Positive for wheezing. Negative for shortness of breath.   Cardiovascular:  Negative for chest pain.  Musculoskeletal:  Positive for arthralgias and back pain.  Skin:  Negative for rash.    Objective:  BP 138/69   Pulse 87   Temp 98.3 F (36.8 C)   Ht '5\' 6"'$  (1.676 m)   Wt 157 lb 6.4 oz (71.4 kg)   SpO2 92%  BMI 25.41 kg/m   BP Readings from Last 3 Encounters:  06/10/22 138/69  03/13/22 126/80  12/05/21 (!) 143/74    Wt Readings from Last 3 Encounters:  06/10/22 157 lb 6.4 oz (71.4 kg)  03/13/22 161 lb 12.8 oz (73.4 kg)  02/27/22 167 lb (75.8 kg)     Physical Exam Vitals reviewed.  Constitutional:      Appearance: He is well-developed.  HENT:     Head: Normocephalic and atraumatic.      Right Ear: External ear normal.     Left Ear: External ear normal.     Mouth/Throat:     Pharynx: No oropharyngeal exudate or posterior oropharyngeal erythema.  Eyes:     Pupils: Pupils are equal, round, and reactive to light.  Cardiovascular:     Rate and Rhythm: Normal rate and regular rhythm.     Heart sounds: No murmur heard. Pulmonary:     Effort: No respiratory distress.     Breath sounds: Wheezing present.  Musculoskeletal:        General: Deformity (tender BAker cyst left popliteal) present.     Cervical back: Normal range of motion and neck supple.  Neurological:     Mental Status: He is alert and oriented to person, place, and time.       Assessment & Plan:   Joshua Bowman was seen today for medical management of chronic issues.  Diagnoses and all orders for this visit:  Lumbar herniated disc  Controlled substance agreement signed -     ToxASSURE Select 13 (MW), Urine  Primary hypertension  Encounter for screening for malignant neoplasm of lung in current smoker with 30 pack year history or greater -     CT CHEST LUNG CANCER SCREENING LOW DOSE WO CONTRAST; Future  Mixed simple and mucopurulent chronic bronchitis ([place])  Baker's cyst of knee, left  Other orders -     HYDROcodone-acetaminophen (NORCO/VICODIN) 5-325 MG tablet; Take 0.5 tablets by mouth every 6 (six) hours as needed for moderate pain. -     HYDROcodone-acetaminophen (NORCO/VICODIN) 5-325 MG tablet; Take 0.5 tablets by mouth every 6 (six) hours as needed for moderate pain. -     HYDROcodone-acetaminophen (NORCO/VICODIN) 5-325 MG tablet; Take 0.5 tablets by mouth every 6 (six) hours as needed for moderate pain. -     albuterol (VENTOLIN HFA) 108 (90 Base) MCG/ACT inhaler; Inhale 2 puffs into the lungs every 4 (four) hours as needed for wheezing or shortness of breath.   Allergies as of 06/10/2022       Reactions   Lipitor [atorvastatin] Other (See Comments)   myalgias   Sulfa Antibiotics Itching, Rash         Medication List        Accurate as of June 10, 2022  5:31 PM. If you have any questions, ask your nurse or doctor.          albuterol 108 (90 Base) MCG/ACT inhaler Commonly known as: VENTOLIN HFA Inhale 2 puffs into the lungs every 4 (four) hours as needed for wheezing or shortness of breath.   aspirin 325 MG tablet Take 325 mg by mouth every other day.   budesonide-formoterol 160-4.5 MCG/ACT inhaler Commonly known as: Symbicort INHALE 2 PUFFS INTO THE LUNGS TWICE A DAY   diclofenac sodium 1 % Gel Commonly known as: Voltaren Apply 4 g topically 2 (two) times daily.   DULoxetine 60 MG capsule Commonly known as: CYMBALTA Take 1 capsule (60 mg total) by mouth 2 (two)  times daily.   fexofenadine 180 MG tablet Commonly known as: ALLEGRA Take 1 tablet (180 mg total) by mouth daily. For allergy symptoms   HYDROcodone-acetaminophen 5-325 MG tablet Commonly known as: NORCO/VICODIN Take 0.5 tablets by mouth every 6 (six) hours as needed for moderate pain. What changed: Another medication with the same name was changed. Make sure you understand how and when to take each. Changed by: Claretta Fraise, MD   HYDROcodone-acetaminophen 5-325 MG tablet Commonly known as: NORCO/VICODIN Take 0.5 tablets by mouth every 6 (six) hours as needed for moderate pain. Start taking on: July 11, 2022 What changed: These instructions start on July 11, 2022. If you are unsure what to do until then, ask your doctor or other care provider. Changed by: Claretta Fraise, MD   HYDROcodone-acetaminophen 5-325 MG tablet Commonly known as: NORCO/VICODIN Take 0.5 tablets by mouth every 6 (six) hours as needed for moderate pain. Start taking on: August 09, 2022 What changed: These instructions start on August 09, 2022. If you are unsure what to do until then, ask your doctor or other care provider. Changed by: Claretta Fraise, MD   nabumetone 500 MG tablet Commonly known as:  RELAFEN Take 2 tablets (1,000 mg total) by mouth 2 (two) times daily. For muscle and joint pain   silodosin 8 MG Caps capsule Commonly known as: RAPAFLO Take 1 capsule (8 mg total) by mouth daily with breakfast.   Vitamin D3 125 MCG (5000 UT) Caps Take 1 tablet by mouth. 2-3 x week        Meds ordered this encounter  Medications   HYDROcodone-acetaminophen (NORCO/VICODIN) 5-325 MG tablet    Sig: Take 0.5 tablets by mouth every 6 (six) hours as needed for moderate pain.    Dispense:  60 tablet    Refill:  0   HYDROcodone-acetaminophen (NORCO/VICODIN) 5-325 MG tablet    Sig: Take 0.5 tablets by mouth every 6 (six) hours as needed for moderate pain.    Dispense:  60 tablet    Refill:  0   HYDROcodone-acetaminophen (NORCO/VICODIN) 5-325 MG tablet    Sig: Take 0.5 tablets by mouth every 6 (six) hours as needed for moderate pain.    Dispense:  60 tablet    Refill:  0   albuterol (VENTOLIN HFA) 108 (90 Base) MCG/ACT inhaler    Sig: Inhale 2 puffs into the lungs every 4 (four) hours as needed for wheezing or shortness of breath.    Dispense:  18 g    Refill:  4      Follow-up: Return in about 3 months (around 09/09/2022).  Claretta Fraise, M.D.

## 2022-06-12 LAB — TOXASSURE SELECT 13 (MW), URINE

## 2022-07-16 ENCOUNTER — Other Ambulatory Visit: Payer: Self-pay

## 2022-07-16 DIAGNOSIS — F17210 Nicotine dependence, cigarettes, uncomplicated: Secondary | ICD-10-CM

## 2022-07-16 DIAGNOSIS — Z122 Encounter for screening for malignant neoplasm of respiratory organs: Secondary | ICD-10-CM

## 2022-07-16 DIAGNOSIS — Z87891 Personal history of nicotine dependence: Secondary | ICD-10-CM

## 2022-08-26 ENCOUNTER — Ambulatory Visit (INDEPENDENT_AMBULATORY_CARE_PROVIDER_SITE_OTHER): Payer: Medicare Other | Admitting: Acute Care

## 2022-08-26 ENCOUNTER — Encounter: Payer: Self-pay | Admitting: Acute Care

## 2022-08-26 DIAGNOSIS — Z122 Encounter for screening for malignant neoplasm of respiratory organs: Secondary | ICD-10-CM

## 2022-08-26 DIAGNOSIS — F17200 Nicotine dependence, unspecified, uncomplicated: Secondary | ICD-10-CM

## 2022-08-26 NOTE — Progress Notes (Signed)
Virtual Visit via Telephone Note  I connected with Joshua Bowman on 08/26/22 at  3:00 PM EST by telephone and verified that I am speaking with the correct person using two identifiers.  Location: Patient:  At home Provider:  [address], [place], [place], [address]    I discussed the limitations, risks, security and privacy concerns of performing an evaluation and management service by telephone and the availability of in person appointments. I also discussed with the patient that there may be a patient responsible charge related to this service. The patient expressed understanding and agreed to proceed.   Shared Decision Making Visit Lung Cancer Screening Program [phone])   Eligibility: Age 76 y.o. Pack Years Smoking History Calculation 45 pack year smoking history (# packs/per year x # years smoked) Recent History of coughing up blood  no Unexplained weight loss? no ( >Than 15 pounds within the last 6 months ) Prior History Lung / other cancer no (Diagnosis within the last 5 years already requiring surveillance chest CT Scans). Smoking Status Current Smoker Former Smokers: Years since quit:  NA  Quit Date:  NA  Visit Components: Discussion included one or more decision making aids. yes Discussion included risk/benefits of screening. yes Discussion included potential follow up diagnostic testing for abnormal scans. yes Discussion included meaning and risk of over diagnosis. yes Discussion included meaning and risk of False Positives. yes Discussion included meaning of total radiation exposure. yes  Counseling Included: Importance of adherence to annual lung cancer LDCT screening. yes Impact of comorbidities on ability to participate in the program. yes Ability and willingness to under diagnostic treatment. yes  Smoking Cessation Counseling: Current Smokers:  Discussed importance of smoking cessation. yes Information about tobacco cessation classes and  interventions provided to patient. yes Patient provided with "ticket" for LDCT Scan. yes Symptomatic Patient. no  Counseling NA Diagnosis Code: Tobacco Use Z72.0 Asymptomatic Patient yes  Counseling (Intermediate counseling: > three minutes counseling) [id number] Former Smokers:  Discussed the importance of maintaining cigarette abstinence. yes Diagnosis Code: Personal History of Nicotine Dependence. Q46.962 Information about tobacco cessation classes and interventions provided to patient. Yes Patient provided with "ticket" for LDCT Scan. yes Written Order for Lung Cancer Screening with LDCT placed in Epic. Yes (CT Chest Lung Cancer Screening Low Dose W/O CM) [id number] Z12.2-Screening of respiratory organs Z87.891-Personal history of nicotine dependence  I have spent 25 minutes of face to face/ virtual visit   time with  Joshua Bowman discussing the risks and benefits of lung cancer screening. We viewed / discussed a power point together that explained in detail the above noted topics. We paused at intervals to allow for questions to be asked and answered to ensure understanding.We discussed that the single most powerful action that he can take to decrease his risk of developing lung cancer is to quit smoking. We discussed whether or not he is ready to commit to setting a quit date. We discussed options for tools to aid in quitting smoking including nicotine replacement therapy, non-nicotine medications, support groups, Quit Smart classes, and behavior modification. We discussed that often times setting smaller, more achievable goals, such as eliminating 1 cigarette a day for a week and then 2 cigarettes a day for a week can be helpful in slowly decreasing the number of cigarettes smoked. This allows for a sense of accomplishment as well as providing a clinical benefit. I provided  him  with smoking cessation  information  with contact information for community resources,  classes, free nicotine replacement  therapy, and access to mobile apps, text messaging, and on-line smoking cessation help. I have also provided  him  the office contact information in the event he needs to contact me, or the screening staff. We discussed the time and location of the scan, and that either Doroteo Glassman RN, Joella Prince, RN  or I will call / send a letter with the results within 24-72 hours of receiving them. The patient verbalized understanding of all of  the above and had no further questions upon leaving the office. They have my contact information in the event they have any further questions.  I spent 3 minutes counseling on smoking cessation and the health risks of continued tobacco abuse.  I explained to the patient that there has been a high incidence of coronary artery disease noted on these exams. I explained that this is a non-gated exam therefore degree or severity cannot be determined. This patient is not on statin therapy. I have asked the patient to follow-up with their PCP regarding any incidental finding of coronary artery disease and management with diet or medication as their PCP  feels is clinically indicated. The patient verbalized understanding of the above and had no further questions upon completion of the visit.      Magdalen Spatz, NP 08/26/2022

## 2022-08-26 NOTE — Patient Instructions (Signed)
Thank you for participating in the [facility]. It was our pleasure to meet you today. We will call you with the results of your scan within the next few days. Your scan will be assigned a Lung RADS category score by the physicians reading the scans.  This Lung RADS score determines follow up scanning.  See below for description of categories, and follow up screening recommendations. We will be in touch to schedule your follow up screening annually or based on recommendations of our providers. We will fax a copy of your scan results to your Primary Care Physician, or the physician who referred you to the program, to ensure they have the results. Please call the office if you have any questions or concerns regarding your scanning experience or results.  Our office number is [phone]. Please speak with Denise Phelps, RN. , or  Denise Buckner RN, They are  our Lung Cancer Screening RN.'s If They are unavailable when you call, Please leave a message on the voice mail. We will return your call at our earliest convenience.This voice mail is monitored several times a day.  Remember, if your scan is normal, we will scan you annually as long as you continue to meet the criteria for the program. (Age 55-77, Current smoker or smoker who has quit within the last 15 years). If you are a smoker, remember, quitting is the single most powerful action that you can take to decrease your risk of lung cancer and other pulmonary, breathing related problems. We know quitting is hard, and we are here to help.  Please let us know if there is anything we can do to help you meet your goal of quitting. If you are a former smoker, congratulations. We are proud of you! Remain smoke free! Remember you can refer friends or family members through the number above.  We will screen them to make sure they meet criteria for the program. Thank you for helping us take better care of you by  participating in Lung Screening.  You can receive free nicotine replacement therapy ( patches, gum or mints) by calling 1-800-QUIT NOW. Please call so we can get you on the path to becoming  a non-smoker. I know it is hard, but you can do this!  Lung RADS Categories:  Lung RADS 1: no nodules or definitely non-concerning nodules.  Recommendation is for a repeat annual scan in 12 months.  Lung RADS 2:  nodules that are non-concerning in appearance and behavior with a very low likelihood of becoming an active cancer. Recommendation is for a repeat annual scan in 12 months.  Lung RADS 3: nodules that are probably non-concerning , includes nodules with a low likelihood of becoming an active cancer.  Recommendation is for a 6-month repeat screening scan. Often noted after an upper respiratory illness. We will be in touch to make sure you have no questions, and to schedule your 6-month scan.  Lung RADS 4 A: nodules with concerning findings, recommendation is most often for a follow up scan in 3 months or additional testing based on our provider's assessment of the scan. We will be in touch to make sure you have no questions and to schedule the recommended 3 month follow up scan.  Lung RADS 4 B:  indicates findings that are concerning. We will be in touch with you to schedule additional diagnostic testing based on our provider's  assessment of the scan.  Other options for assistance in smoking cessation (   As covered by your insurance benefits)  Hypnosis for smoking cessation  [employer] [phone]  Acupuncture for smoking cessation  [facility] [phone]   

## 2022-08-27 ENCOUNTER — Ambulatory Visit (HOSPITAL_COMMUNITY)
Admission: RE | Admit: 2022-08-27 | Discharge: 2022-08-27 | Disposition: A | Discharge status: Home / Self Care | Payer: Medicare Other | Source: Ambulatory Visit | Attending: Acute Care | Admitting: Acute Care

## 2022-08-27 ENCOUNTER — Telehealth: Payer: Self-pay | Admitting: Acute Care

## 2022-08-27 DIAGNOSIS — F17210 Nicotine dependence, cigarettes, uncomplicated: Secondary | ICD-10-CM | POA: Diagnosis not present

## 2022-08-27 DIAGNOSIS — Z122 Encounter for screening for malignant neoplasm of respiratory organs: Secondary | ICD-10-CM | POA: Insufficient documentation

## 2022-08-27 DIAGNOSIS — Z87891 Personal history of nicotine dependence: Secondary | ICD-10-CM | POA: Insufficient documentation

## 2022-08-28 NOTE — Telephone Encounter (Signed)
Spoke with Tiffany at [facility]  Call report on LCCT dated 08/27/22   Impression:    IMPRESSION: 1. Lung-RADS 2, benign appearance or behavior. Continue annual screening with low-dose chest CT without contrast in 12 months. 2. Right middle lobe volume loss and scarring. Narrowing of the subtending bronchus without obstructive mass. Concurrent right lower lobe bronchial wall thickening and mucous plugging. Consider bronchoscopy with attention to the right middle and right lower lobe endobronchial tree. 3. Aortic atherosclerosis (ICD10-I70.0), coronary artery atherosclerosis and emphysema (ICD10-J43.9). 4. Aortic valvular calcifications. Consider echocardiography to evaluate for valvular dysfunction.   These results will be called to the ordering clinician or representative by the Radiologist Assistant, and communication documented in the PACS or [employer].     Electronically Signed   By: Abigail Miyamoto M.D.   On: 08/27/2022 16:50  Routing to Judson Roch to be called for the screening program, thanks!

## 2022-08-29 ENCOUNTER — Other Ambulatory Visit: Payer: Self-pay | Admitting: Acute Care

## 2022-08-29 ENCOUNTER — Telehealth: Payer: Self-pay | Admitting: Acute Care

## 2022-08-29 DIAGNOSIS — J418 Mixed simple and mucopurulent chronic bronchitis: Secondary | ICD-10-CM

## 2022-08-29 DIAGNOSIS — F17210 Nicotine dependence, cigarettes, uncomplicated: Secondary | ICD-10-CM

## 2022-08-29 DIAGNOSIS — Z87891 Personal history of nicotine dependence: Secondary | ICD-10-CM

## 2022-08-29 DIAGNOSIS — Z720 Tobacco use: Secondary | ICD-10-CM

## 2022-08-29 DIAGNOSIS — Z122 Encounter for screening for malignant neoplasm of respiratory organs: Secondary | ICD-10-CM

## 2022-08-29 NOTE — Telephone Encounter (Signed)
I have called the patient with the results of his low-dose screening CT.  I explained his scan was read as a lung RADS 2, annual low-dose screening CT. There was notation however on CT of right middle lobe volume loss and scarring.  Narrowing of the subtending bronchus without obstructive mass.  And concurrent right lower lobe bronchial wall thickening and mucous plugging.  Suggestion by radiology was for bronchoscopy with attention to the right middle and right lower lobe endobronchial tree. Patient states he has had more chest congestion that is normal for him.  He is coughing up what he describes as white to creamy secretions and a larger amount than its his baseline.  He also endorses shortness of breath.  He has not seen by pulmonology however he is on a Symbicort inhaler per his PCP.  Additionally he uses albuterol for breakthrough needs. I have placed a referral to [place] [facility] at [facility], for COPD, worsening dyspnea, and evaluation for need of bronchoscopy with attention to right middle and right lower lobe of the endobronchial tree.  Patient is in agreement with this plan. Again I reminded him he needs a 40-monthfollow-up low-dose screening CT. DLangley Gaussplease place order for 166-monthow-dose screening CT due in November 2024 and fax results to PCP including treatment plan for referral to [facility] [place] to address mucous plugging and narrowing of the bronchus. Thanks so much

## 2022-08-29 NOTE — Telephone Encounter (Signed)
CT results faxed to PCP with follow up plans included. Order placed for 12 mth f/u lung screening CT.

## 2022-09-03 ENCOUNTER — Telehealth: Payer: Self-pay | Admitting: Family Medicine

## 2022-09-03 ENCOUNTER — Other Ambulatory Visit: Payer: Self-pay | Admitting: Family Medicine

## 2022-09-03 NOTE — Telephone Encounter (Signed)
TC back to pt about RF on his Tamsulosin. In looking at pt's chart this was [facility] 03/13/22 and changed to Silodosin. Pt stated that did not work as well and thought it was changed back to the Tamsulosin. In looking at the Sept visit the Silodosin was still listed. Pt has an appt coming up on Tues 06/10/22. PCP is out of the office till [facility] 06/09/22. Pt will wait till his appt to discuss w/ PCP

## 2022-09-03 NOTE — Telephone Encounter (Signed)
  Prescription Request  09/03/2022  Is this a "Controlled Substance" medicine?   Have you seen your PCP in the last 2 weeks? Appt 12/12  If YES, route message to pool  -  If NO, patient needs to be scheduled for appointment.  What is the name of the medication or equipment? Tamsulosin 0.'4mg'$    Have you contacted your pharmacy to request a refill? yes   Which pharmacy would you like this sent to?  [place], [place]       Patient notified that their request is being sent to the clinical staff for review and that they should receive a response within 2 business days.

## 2022-09-09 ENCOUNTER — Ambulatory Visit (INDEPENDENT_AMBULATORY_CARE_PROVIDER_SITE_OTHER): Payer: Medicare Other | Admitting: Family Medicine

## 2022-09-09 ENCOUNTER — Encounter: Payer: Self-pay | Admitting: Family Medicine

## 2022-09-09 VITALS — BP 132/76 | HR 89 | Temp 98.1°F | Ht 66.0 in | Wt 154.2 lb

## 2022-09-09 DIAGNOSIS — Z23 Encounter for immunization: Secondary | ICD-10-CM | POA: Diagnosis not present

## 2022-09-09 DIAGNOSIS — E785 Hyperlipidemia, unspecified: Secondary | ICD-10-CM

## 2022-09-09 DIAGNOSIS — I700 Atherosclerosis of aorta: Secondary | ICD-10-CM | POA: Diagnosis not present

## 2022-09-09 DIAGNOSIS — I10 Essential (primary) hypertension: Secondary | ICD-10-CM | POA: Diagnosis not present

## 2022-09-09 MED ORDER — HYDROCODONE-ACETAMINOPHEN 5-325 MG PO TABS: 0.5000 | ORAL_TABLET | Freq: Four times a day (QID) | ORAL | 0 refills | Status: DC | PRN

## 2022-09-09 MED ORDER — BUDESONIDE-FORMOTEROL FUMARATE 160-4.5 MCG/ACT IN AERO: INHALATION_SPRAY | RESPIRATORY_TRACT | 5 refills | Status: DC

## 2022-09-09 MED ORDER — ROSUVASTATIN CALCIUM 5 MG PO TABS: ORAL_TABLET | ORAL | 3 refills | Status: DC

## 2022-09-09 MED ORDER — DULOXETINE HCL 60 MG PO CPEP: 60.0000 mg | ORAL_CAPSULE | Freq: Two times a day (BID) | ORAL | 2 refills | Status: DC

## 2022-09-09 MED ORDER — TAMSULOSIN HCL 0.4 MG PO CAPS: 0.8000 mg | ORAL_CAPSULE | Freq: Every day | ORAL | 3 refills | Status: DC

## 2022-09-09 NOTE — Progress Notes (Signed)
Subjective:  Patient ID: Joshua Bowman, male    DOB: 1945-10-23  Age: 76 y.o. MRN: [id number]  CC: Medical Management of Chronic Issues  2 HPI JERRION TABBERT presents for Chronic pain from lumbar herniated disc.  Painis manageble with current regimenat 10 MME per day. PDMP review shows appropriate utilization of the patient's controlled medications.   in for follow-up of elevated cholesterol. Doing well without complaints on current medication. Denies side effects of statin including myalgia and arthralgia and nausea. Currently no chest pain, shortness of breath or other cardiovascular related symptoms noted.  Recent CT shows aortic atherosclerosis for which he is taking statin med.        09/09/2022   12:54 PM 06/10/2022    1:05 PM 06/10/2022   12:51 PM  Depression screen PHQ 2/9  Decreased Interest 0 1 0  Down, Depressed, Hopeless 0 0 0  PHQ - 2 Score 0 1 0  Altered sleeping  0   Tired, decreased energy  1   Change in appetite  0   Feeling bad or failure about yourself   0   Trouble concentrating  0   Moving slowly or fidgety/restless  0   Suicidal thoughts  0   PHQ-9 Score  2   Difficult doing work/chores  Not difficult at all     History Jasman has a past medical history of Anxiety, Arthritis, Bilateral carotid artery stenosis, Colon polyps - attenuated polyposis syndrome, COPD (chronic obstructive pulmonary disease) ([place]), Duodenitis, Gastritis, Hemorrhoids, Hyperlipidemia, Hyperplasia of prostate, IBS (irritable bowel syndrome), Pre-diabetes, and Sigmoid diverticulosis.   He has a past surgical history that includes Appendectomy (1969); nasal surgery ( spurs ) (70's); lipoma rt shoulder (01/2001); rt. shoulder -rotator cuff repair (08/2005); Colonoscopy; Esophagogastroduodenoscopy; Tonsillectomy; and Eye surgery.   His family history includes Arthritis in his sister; Congestive Heart Failure in his father; Heart attack in his brother; Heart disease in his brother and  mother; Post-traumatic stress disorder in his father; Prostate cancer in his brother.He reports that he has been smoking cigarettes. He started smoking about 41 years ago. He has a 45.00 pack-year smoking history. He has quit using smokeless tobacco. He reports that he does not currently use alcohol. He reports that he does not use drugs.    ROS Review of Systems  Constitutional:  Negative for fever.  Respiratory:  Negative for shortness of breath.   Cardiovascular:  Negative for chest pain.  Musculoskeletal:  Negative for arthralgias.  Skin:  Negative for rash.    Objective:  BP 132/76   Pulse 89   Temp 98.1 F (36.7 C)   Ht _0  (1.676 m)   Wt 154 lb 3.2 oz (69.9 kg)   SpO2 96%   BMI 24.89 kg/m   BP Readings from Last 3 Encounters:  09/09/22 132/76  06/10/22 138/69  03/13/22 126/80    Wt Readings from Last 3 Encounters:  09/09/22 154 lb 3.2 oz (69.9 kg)  06/10/22 157 lb 6.4 oz (71.4 kg)  03/13/22 161 lb 12.8 oz (73.4 kg)     Physical Exam Vitals reviewed.  Constitutional:      Appearance: He is well-developed.  HENT:     Head: Normocephalic and atraumatic.     Right Ear: External ear normal.     Left Ear: External ear normal.     Mouth/Throat:     Pharynx: No oropharyngeal exudate or posterior oropharyngeal erythema.  Eyes:     Pupils: Pupils are equal, round,  and reactive to light.  Cardiovascular:     Rate and Rhythm: Normal rate and regular rhythm.     Heart sounds: No murmur heard. Pulmonary:     Effort: No respiratory distress.     Breath sounds: Normal breath sounds.  Musculoskeletal:     Cervical back: Normal range of motion and neck supple.  Neurological:     Mental Status: He is alert and oriented to person, place, and time.       Assessment & Plan:   Terald was seen today for medical management of chronic issues.  Diagnoses and all orders for this visit:  Primary hypertension -     CBC with Differential/Platelet -      CMP14+EGFR  Hyperlipidemia, unspecified hyperlipidemia type -     Lipid panel  Aortic atherosclerosis ([place])  Need for influenza vaccination -     Flu Vaccine QUAD High Dose(Fluad)  Other orders -     budesonide-formoterol (SYMBICORT) 160-4.5 MCG/ACT inhaler; INHALE 2 PUFFS INTO THE LUNGS TWICE A DAY -     DULoxetine (CYMBALTA) 60 MG capsule; Take 1 capsule (60 mg total) by mouth 2 (two) times daily. -     HYDROcodone-acetaminophen (NORCO/VICODIN) 5-325 MG tablet; Take 0.5 tablets by mouth every 6 (six) hours as needed for moderate pain. -     HYDROcodone-acetaminophen (NORCO/VICODIN) 5-325 MG tablet; Take 0.5 tablets by mouth every 6 (six) hours as needed for moderate pain. -     HYDROcodone-acetaminophen (NORCO/VICODIN) 5-325 MG tablet; Take 0.5 tablets by mouth every 6 (six) hours as needed for moderate pain. -     rosuvastatin (CRESTOR) 5 MG tablet; Take one weekly for one month. Then increase two one pill twice a week for one month. Then increase to one three times a week. -     tamsulosin (FLOMAX) 0.4 MG CAPS capsule; Take 2 capsules (0.8 mg total) by mouth at bedtime. For urine flow and prostate       I have discontinued Savyon W. Kincade's fexofenadine, nabumetone, and silodosin. I have also changed his DULoxetine. Additionally, I am having him start on rosuvastatin and tamsulosin. Lastly, I am having him maintain his aspirin, Vitamin D3, diclofenac sodium, albuterol, budesonide-formoterol, HYDROcodone-acetaminophen, HYDROcodone-acetaminophen, and HYDROcodone-acetaminophen. We will continue to administer betamethasone acetate-betamethasone sodium phosphate and betamethasone acetate-betamethasone sodium phosphate.  Allergies as of 09/09/2022       Reactions   Lipitor [atorvastatin] Other (See Comments)   myalgias   Sulfa Antibiotics Itching, Rash        Medication List        Accurate as of September 09, 2022 10:27 PM. If you have any questions, ask your nurse or doctor.           STOP taking these medications    fexofenadine 180 MG tablet Commonly known as: ALLEGRA Stopped by: Claretta Fraise, MD   nabumetone 500 MG tablet Commonly known as: RELAFEN Stopped by: Claretta Fraise, MD   silodosin 8 MG Caps capsule Commonly known as: RAPAFLO Stopped by: Claretta Fraise, MD       TAKE these medications    albuterol 108 (90 Base) MCG/ACT inhaler Commonly known as: VENTOLIN HFA Inhale 2 puffs into the lungs every 4 (four) hours as needed for wheezing or shortness of breath.   aspirin 325 MG tablet Take 325 mg by mouth every other day.   budesonide-formoterol 160-4.5 MCG/ACT inhaler Commonly known as: Symbicort INHALE 2 PUFFS INTO THE LUNGS TWICE A DAY   diclofenac sodium 1 %  Gel Commonly known as: Voltaren Apply 4 g topically 2 (two) times daily.   DULoxetine 60 MG capsule Commonly known as: CYMBALTA Take 1 capsule (60 mg total) by mouth 2 (two) times daily.   HYDROcodone-acetaminophen 5-325 MG tablet Commonly known as: NORCO/VICODIN Take 0.5 tablets by mouth every 6 (six) hours as needed for moderate pain. What changed: Another medication with the same name was changed. Make sure you understand how and when to take each. Changed by: Claretta Fraise, MD   HYDROcodone-acetaminophen 5-325 MG tablet Commonly known as: NORCO/VICODIN Take 0.5 tablets by mouth every 6 (six) hours as needed for moderate pain. Start taking on: October 09, 2022 What changed: These instructions start on October 09, 2022. If you are unsure what to do until then, ask your doctor or other care provider. Changed by: Claretta Fraise, MD   HYDROcodone-acetaminophen 5-325 MG tablet Commonly known as: NORCO/VICODIN Take 0.5 tablets by mouth every 6 (six) hours as needed for moderate pain. Start taking on: November 08, 2022 What changed: These instructions start on November 08, 2022. If you are unsure what to do until then, ask your doctor or other care provider. Changed by:  Claretta Fraise, MD   rosuvastatin 5 MG tablet Commonly known as: Crestor Take one weekly for one month. Then increase two one pill twice a week for one month. Then increase to one three times a week. Started by: Claretta Fraise, MD   tamsulosin 0.4 MG Caps capsule Commonly known as: FLOMAX Take 2 capsules (0.8 mg total) by mouth at bedtime. For urine flow and prostate Started by: Claretta Fraise, MD   Vitamin D3 125 MCG (5000 UT) Caps Take 1 tablet by mouth. 2-3 x week         Follow-up: Return in about 3 months (around 12/09/2022).  Claretta Fraise, M.D.

## 2022-10-06 ENCOUNTER — Telehealth (INDEPENDENT_AMBULATORY_CARE_PROVIDER_SITE_OTHER): Payer: Medicare Other | Admitting: Family Medicine

## 2022-10-06 ENCOUNTER — Encounter: Payer: Self-pay | Admitting: Family Medicine

## 2022-10-06 ENCOUNTER — Ambulatory Visit: Payer: Medicare Other | Admitting: Pulmonary Disease

## 2022-10-06 DIAGNOSIS — U071 COVID-19: Secondary | ICD-10-CM | POA: Diagnosis not present

## 2022-10-06 MED ORDER — MOLNUPIRAVIR EUA 200MG CAPSULE: 4.0000 | ORAL_CAPSULE | Freq: Two times a day (BID) | ORAL | 0 refills | Status: AC

## 2022-10-06 MED ORDER — PREDNISONE 20 MG PO TABS: ORAL_TABLET | ORAL | 0 refills | Status: DC

## 2022-10-06 NOTE — Progress Notes (Signed)
Virtual Visit via telephone Note  I connected with Joshua Bowman on 10/06/22 at 0925 by telephone and verified that I am speaking with the correct person using two identifiers. Joshua Bowman is currently located at home and patient are currently with her during visit. The provider, Fransisca Kaufmann Eugenia Eldredge, MD is located in their office at time of visit.  Call ended at [phone]  I discussed the limitations, risks, security and privacy concerns of performing an evaluation and management service by telephone and the availability of in person appointments. I also discussed with the patient that there may be a patient responsible charge related to this service. The patient expressed understanding and agreed to proceed.   History and Present Illness: Patient did a home test last Friday and tested positive for covid.  He has a few chills and fever.  He has a little wheezing and has COPD.  He started with symptoms 2 days ago and they are not too bad yet. He is using mucinex and tylenol and albuterol and it is helping some.   1. COVID-19 virus infection     Outpatient Encounter Medications as of 10/06/2022  Medication Sig   molnupiravir EUA (LAGEVRIO) 200 mg CAPS capsule Take 4 capsules (800 mg total) by mouth 2 (two) times daily for 5 days.   predniSONE (DELTASONE) 20 MG tablet 2 po at same time daily for 5 days   albuterol (VENTOLIN HFA) 108 (90 Base) MCG/ACT inhaler Inhale 2 puffs into the lungs every 4 (four) hours as needed for wheezing or shortness of breath.   aspirin 325 MG tablet Take 325 mg by mouth every other day.    budesonide-formoterol (SYMBICORT) 160-4.5 MCG/ACT inhaler INHALE 2 PUFFS INTO THE LUNGS TWICE A DAY   Cholecalciferol (VITAMIN D3) 5000 UNITS CAPS Take 1 tablet by mouth. 2-3 x week   diclofenac sodium (VOLTAREN) 1 % GEL Apply 4 g topically 2 (two) times daily.   DULoxetine (CYMBALTA) 60 MG capsule Take 1 capsule (60 mg total) by mouth 2 (two) times daily.    HYDROcodone-acetaminophen (NORCO/VICODIN) 5-325 MG tablet Take 0.5 tablets by mouth every 6 (six) hours as needed for moderate pain.   [START ON 10/09/2022] HYDROcodone-acetaminophen (NORCO/VICODIN) 5-325 MG tablet Take 0.5 tablets by mouth every 6 (six) hours as needed for moderate pain.   [START ON 11/08/2022] HYDROcodone-acetaminophen (NORCO/VICODIN) 5-325 MG tablet Take 0.5 tablets by mouth every 6 (six) hours as needed for moderate pain.   rosuvastatin (CRESTOR) 5 MG tablet Take one weekly for one month. Then increase two one pill twice a week for one month. Then increase to one three times a week.   tamsulosin (FLOMAX) 0.4 MG CAPS capsule Take 2 capsules (0.8 mg total) by mouth at bedtime. For urine flow and prostate   Facility-Administered Encounter Medications as of 10/06/2022  Medication   betamethasone acetate-betamethasone sodium phosphate (CELESTONE) injection 6 mg   betamethasone acetate-betamethasone sodium phosphate (CELESTONE) injection 6 mg    Review of Systems  Constitutional:  Negative for chills and fever.  HENT:  Positive for congestion, postnasal drip, rhinorrhea, sinus pressure, sneezing and sore throat. Negative for ear discharge, ear pain and voice change.   Eyes:  Negative for pain, discharge, redness and visual disturbance.  Respiratory:  Positive for cough and wheezing. Negative for shortness of breath.   Cardiovascular:  Negative for chest pain and leg swelling.  Musculoskeletal:  Negative for gait problem.  Skin:  Negative for rash.  All other systems reviewed and  are negative.   Observations/Objective: Patient sounds comfortable and in no acute distress  Assessment and Plan: Problem List Items Addressed This Visit   None Visit Diagnoses     COVID-19 virus infection    -  Primary   Relevant Medications   molnupiravir EUA (LAGEVRIO) 200 mg CAPS capsule   predniSONE (DELTASONE) 20 MG tablet     Patient sounds like he has COVID with testing positive at  home, not too much going on the COPD yet but gave him some prednisone that he can use if he needs it.  Will give COVID antiviral medicine  Follow up plan: Return if symptoms worsen or fail to improve.     I discussed the assessment and treatment plan with the patient. The patient was provided an opportunity to ask questions and all were answered. The patient agreed with the plan and demonstrated an understanding of the instructions.   The patient was advised to call back or seek an in-person evaluation if the symptoms worsen or if the condition fails to improve as anticipated.  The above assessment and management plan was discussed with the patient. The patient verbalized understanding of and has agreed to the management plan. Patient is aware to call the clinic if symptoms persist or worsen. Patient is aware when to return to the clinic for a follow-up visit. Patient educated on when it is appropriate to go to the emergency department.    I provided 9 minutes of non-face-to-face time during this encounter.    Worthy Rancher, MD

## 2022-11-11 ENCOUNTER — Ambulatory Visit (HOSPITAL_COMMUNITY)
Admission: RE | Admit: 2022-11-11 | Discharge: 2022-11-11 | Disposition: A | Discharge status: Home / Self Care | Payer: Medicare Other | Source: Ambulatory Visit | Attending: Internal Medicine | Admitting: Internal Medicine

## 2022-11-11 ENCOUNTER — Encounter: Payer: Self-pay | Admitting: Internal Medicine

## 2022-11-11 ENCOUNTER — Ambulatory Visit: Payer: Medicare Other | Admitting: Internal Medicine

## 2022-11-11 VITALS — BP 130/68 | HR 90 | Ht 66.0 in | Wt 153.8 lb

## 2022-11-11 DIAGNOSIS — J449 Chronic obstructive pulmonary disease, unspecified: Secondary | ICD-10-CM | POA: Diagnosis not present

## 2022-11-11 DIAGNOSIS — F17210 Nicotine dependence, cigarettes, uncomplicated: Secondary | ICD-10-CM | POA: Diagnosis not present

## 2022-11-11 DIAGNOSIS — R0602 Shortness of breath: Secondary | ICD-10-CM | POA: Diagnosis not present

## 2022-11-11 DIAGNOSIS — R9389 Abnormal findings on diagnostic imaging of other specified body structures: Secondary | ICD-10-CM | POA: Diagnosis not present

## 2022-11-11 MED ORDER — PREDNISONE 10 MG PO TABS: ORAL_TABLET | ORAL | 0 refills | Status: DC

## 2022-11-11 NOTE — Progress Notes (Unsigned)
Joshua Bowman, male    DOB: [birth year]    MRN: [id number]   Brief patient profile:  37   yowm  active smoker  referred to pulmonary clinic in [place]  11/11/2022 by Claretta Fraise for LDSCT showing ? 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 with CB symptoms worse x 2022   History of Present Illness  11/11/2022  Pulmonary/ 1st office eval/ Shawna Kiener / [place] [facility]  Chief Complaint  Patient presents with   New Patient (Initial Visit)    Saw SG NP for LCS    Dyspnea:  [employer] walks slower than avg pushes cart / no hc parking Cough: thick white, never bloody p coffee seems worse / rattles at bedtime  Sleep: does not disturb sleep  SABA use: 2-3 x per week  02: none  No obvious day to day or daytime pattern/variability or assoc  purulent sputum or mucus plugs or hemoptysis or cp or chest tightness, subjective wheeze or overt sinus or hb symptoms.    Also denies any obvious fluctuation of symptoms with weather or environmental changes or other aggravating or alleviating factors except as outlined above   No unusual exposure hx or h/o childhood pna/ asthma or knowledge of premature birth.  Current Allergies, Complete Past Medical History, Past Surgical History, Family History, and Social History were reviewed in [employer] record.  ROS  The following are not active complaints unless bolded Hoarseness, sore throat, dysphagia, dental problems, itching, sneezing,  nasal congestion or discharge of excess mucus or purulent secretions, ear ache,   fever, chills, sweats, unintended wt loss or wt gain, classically pleuritic or exertional cp,  orthopnea pnd or arm/hand swelling  or leg swelling, presyncope, palpitations, abdominal pain, anorexia, nausea, vomiting, diarrhea  or change in bowel habits or change in bladder habits, change in stools or change in urine, dysuria, hematuria,  rash, arthralgias, visual complaints, headache, numbness, weakness or ataxia or problems with  walking or coordination,  change in mood or  memory.              Past Medical History:  Diagnosis Date   Anxiety    Arthritis    Bilateral carotid artery stenosis    Minimal 2012    Colon polyps - attenuated polyposis syndrome    multiple    COPD (chronic obstructive pulmonary disease) (HCC)    Duodenitis    Gastritis    Hemorrhoids    Hyperlipidemia    Hyperplasia of prostate    IBS (irritable bowel syndrome)    Pre-diabetes    Sigmoid diverticulosis    mild     Outpatient Medications Prior to Visit  Medication Sig Dispense Refill   albuterol (VENTOLIN HFA) 108 (90 Base) MCG/ACT inhaler Inhale 2 puffs into the lungs every 4 (four) hours as needed for wheezing or shortness of breath. 18 g 4   aspirin 325 MG tablet Take 325 mg by mouth every other day.      budesonide-formoterol (SYMBICORT) 160-4.5 MCG/ACT inhaler INHALE 2 PUFFS INTO THE LUNGS TWICE A DAY 10.2 each 5   Cholecalciferol (VITAMIN D3) 5000 UNITS CAPS Take 1 tablet by mouth. 2-3 x week     diclofenac sodium (VOLTAREN) 1 % GEL Apply 4 g topically 2 (two) times daily. 100 g 2   DULoxetine (CYMBALTA) 60 MG capsule Take 1 capsule (60 mg total) by mouth 2 (two) times daily. 180 capsule 2   HYDROcodone-acetaminophen (NORCO/VICODIN) 5-325 MG tablet Take 0.5 tablets by mouth  every 6 (six) hours as needed for moderate pain. 60 tablet 0   HYDROcodone-acetaminophen (NORCO/VICODIN) 5-325 MG tablet Take 0.5 tablets by mouth every 6 (six) hours as needed for moderate pain. 60 tablet 0   HYDROcodone-acetaminophen (NORCO/VICODIN) 5-325 MG tablet Take 0.5 tablets by mouth every 6 (six) hours as needed for moderate pain. 60 tablet 0   rosuvastatin (CRESTOR) 5 MG tablet Take one weekly for one month. Then increase two one pill twice a week for one month. Then increase to one three times a week. 90 tablet 3   tamsulosin (FLOMAX) 0.4 MG CAPS capsule Take 2 capsules (0.8 mg total) by mouth at bedtime. For urine flow and prostate 180  capsule 3   predniSONE (DELTASONE) 20 MG tablet 2 po at same time daily for 5 days 10 tablet 0   Facility-Administered Medications Prior to Visit  Medication Dose Route Frequency Provider Last Rate Last Admin   betamethasone acetate-betamethasone sodium phosphate (CELESTONE) injection 6 mg  6 mg Intramuscular Once Claretta Fraise, MD       betamethasone acetate-betamethasone sodium phosphate (CELESTONE) injection 6 mg  6 mg Intramuscular Once Claretta Fraise, MD         Objective:     BP 130/68   Pulse 90   Ht 5' 6"$  (1.676 m)   Wt 153 lb 12.8 oz (69.8 kg)   SpO2 94% Comment: ra  BMI 24.82 kg/m   SpO2: 94 % (ra)  Amb wm typical smoker's rattle   HEENT :  Oropharynx clear    NECK :  without JVD/Nodes/TM/ nl carotid upstrokes bilaterally   LUNGS: no acc muscle use,  Mod barrel  contour chest wall with bilateral  Distant bs s audible wheeze and  without cough on insp or exp maneuvers and mod  Hyperresonant  to  percussion bilaterally     CV:  RRR  no s3 or murmur or increase in P2, and no edema   ABD:  soft and nontender with pos mid insp Hoover's  in the supine position. No bruits or organomegaly appreciated, bowel sounds nl  MS:   Ext warm without deformities or   obvious joint restrictions , calf tenderness, cyanosis or clubbing  SKIN: warm and dry without lesions    NEURO:  alert, approp, nl sensorium with  no motor or cerebellar deficits apparent.         CXR PA and Lateral:   11/11/2022 :    I personally reviewed images and impression is as follows:     Minima atx RML     Assessment   COPD  with CB features Active smoker - Labs ordered 11/11/2022  :  allergy profile   alpha one AT phenotype - 11/11/2022  After extensive coaching inhaler device,  effectiveness =    75% from baseline 25% > continue symbicort 160 2bid/ mucinex/ flutter valve ordered   - 11/11/2022   Walked on RA  x  3  lap(s) =  approx 450  ft  @ slow pace, stopped due to end of study with lowest 02  sats 91% and mild sob    Most RML syndromes are from benign causes and in his case he has significant issues with CB indicating cig induced Mucociliary dysfunction which hope responds to above measures  (see RML syndrome) - avoid LAMA here due to urinary obst issues chronically.  F/u 6 weeks with all meds in hand using a trust but verify approach to confirm accurate Medication  Reconciliation The  principal here is that until we are certain that the  patients are doing what we've asked, it makes no sense to ask them to do more.   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  Total time for H and P, chart review, counseling, reviewing hfa device(s) and generating customized AVS unique to this office visit / same day charting = 35 min new pt eval        Cigarette smoker 4-5 min discussion re active cigarette smoking in addition to office E&M  Ask about tobacco use:   ongoing Advise quitting  I took an extended  opportunity with this patient to outline the consequences of continued cigarette use  in airway disorders based on all the data we have from the multiple national lung health studies (perfomed over decades at millions of dollars in cost)  indicating that smoking cessation, not choice of inhalers or physicians, is the most important aspect of his care and may  result in rapid improvement in his symptom of CB Assess willingness:  Not committed at this point Assist in quit attempt:  Per PCP when ready Arrange follow up:   Follow up per Primary Care planned           Christinia Gully, MD 11/11/2022

## 2022-11-11 NOTE — Patient Instructions (Signed)
Prednisone 10 mg take  4 each am x 2 days,  2 each am x 2 days,  1 each am x 2 days and stop   Continue Symbicort 160  but you must work on inhaler technique:  relax and gently blow all the way out then take a nice smooth full deep breath back in, triggering the inhaler at same time you start breathing in.  Hold breath in for at least  5 seconds if you can. Blow out symbicort  thru nose. Rinse and gargle with water when done.  If mouth or throat bother you at all,  try brushing teeth/gums/tongue with arm and hammer toothpaste/ make a slurry and gargle and spit out.      For cough > mucinex 1200 mg twice daily with a glass of water and use the flutter valve as much as your can   The key is to stop smoking completely before smoking completely stops you!  Please remember to go to the lab department   for your tests - we will call you with the results when they are available.     Please remember to go to the  x-ray department  @  [facility] for your tests - we will call you with the results when they are available     Please schedule a follow up office visit in 6 weeks, call sooner if needed with all medications /inhalers/ solutions in hand so we can verify exactly what you are taking. This includes all medications from all doctors and over the counters   Add: schedule pft's

## 2022-11-12 ENCOUNTER — Telehealth: Payer: Self-pay | Admitting: Internal Medicine

## 2022-11-12 ENCOUNTER — Encounter: Payer: Self-pay | Admitting: Internal Medicine

## 2022-11-12 ENCOUNTER — Telehealth: Payer: Self-pay | Admitting: *Deleted

## 2022-11-12 DIAGNOSIS — J449 Chronic obstructive pulmonary disease, unspecified: Secondary | ICD-10-CM

## 2022-11-12 DIAGNOSIS — F17210 Nicotine dependence, cigarettes, uncomplicated: Secondary | ICD-10-CM | POA: Insufficient documentation

## 2022-11-12 NOTE — Assessment & Plan Note (Signed)
4-5 min discussion re active cigarette smoking in addition to office E&M  Ask about tobacco use:   ongoing Advise quitting  I took an extended  opportunity with this patient to outline the consequences of continued cigarette use  in airway disorders based on all the data we have from the multiple national lung health studies (perfomed over decades at millions of dollars in cost)  indicating that smoking cessation, not choice of inhalers or physicians, is the most important aspect of his care and may result in rapid improvement in his symptom of CB Assess willingness:  Not committed at this point Assist in quit attempt:  Per PCP when ready Arrange follow up:   Follow up per Primary Care planned

## 2022-11-12 NOTE — Assessment & Plan Note (Addendum)
Active smoker - Labs ordered 11/11/2022  :  allergy profile   alpha one AT phenotype - 11/11/2022  After extensive coaching inhaler device,  effectiveness =    75% from baseline 25% > continue symbicort 160 2bid/ mucinex/ flutter valve ordered   - 11/11/2022   Walked on RA  x  3  lap(s) =  approx 450  ft  @ slow pace, stopped due to end of study with lowest 02 sats 91% and mild sob    Most RML syndromes are from benign causes and in his case he has significant issues with CB indicating cig induced Mucociliary dysfunction which hope responds to above measures  (see RML syndrome) - avoid LAMA here due to urinary obst issues chronically.  F/u 6 weeks with all meds in hand using a trust but verify approach to confirm accurate Medication  Reconciliation The principal here is that until we are certain that the  patients are doing what we've asked, it makes no sense to ask them to do more.   Add: pfts ordered

## 2022-11-12 NOTE — Telephone Encounter (Signed)
PFTS ordered for AP

## 2022-11-12 NOTE — Telephone Encounter (Signed)
Schedule pfts next available at [facility]

## 2022-11-12 NOTE — Telephone Encounter (Signed)
As much as possible -every hour while awake would be ideal

## 2022-11-12 NOTE — Telephone Encounter (Signed)
Called and notified patient and he voiced understanding. Nothing further needed at this time,

## 2022-11-12 NOTE — Assessment & Plan Note (Signed)
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  Total time for H and P, chart review, counseling, reviewing hfa device(s) and generating customized AVS unique to this office visit / same day charting = 35 min new pt eval

## 2022-11-12 NOTE — Telephone Encounter (Signed)
Dr. Melvyn Novas do you have a specific recommendation for patient on how often to use his flutter valve?   OV notes state:  use the flutter valve as much as your can

## 2022-11-17 ENCOUNTER — Other Ambulatory Visit: Payer: Self-pay

## 2022-11-17 DIAGNOSIS — R9389 Abnormal findings on diagnostic imaging of other specified body structures: Secondary | ICD-10-CM

## 2022-11-17 LAB — CBC WITH DIFFERENTIAL/PLATELET
Basophils Absolute: 0.1 10*3/uL (ref 0.0–0.2)
Basos: 1 %
EOS (ABSOLUTE): 0.1 10*3/uL (ref 0.0–0.4)
Eos: 2 %
Hematocrit: 50.4 % (ref 37.5–51.0)
Hemoglobin: 16.9 g/dL (ref 13.0–17.7)
Immature Grans (Abs): 0 10*3/uL (ref 0.0–0.1)
Immature Granulocytes: 0 %
Lymphocytes Absolute: 1.9 10*3/uL (ref 0.7–3.1)
Lymphs: 23 %
MCH: 31.6 pg (ref 26.6–33.0)
MCHC: 33.5 g/dL (ref 31.5–35.7)
MCV: 94 fL (ref 79–97)
Monocytes Absolute: 0.7 10*3/uL (ref 0.1–0.9)
Monocytes: 9 %
Neutrophils Absolute: 5.3 10*3/uL (ref 1.4–7.0)
Neutrophils: 65 %
Platelets: 241 10*3/uL (ref 150–450)
RBC: 5.35 x10E6/uL (ref 4.14–5.80)
RDW: 12.9 % (ref 11.6–15.4)
WBC: 8.2 10*3/uL (ref 3.4–10.8)

## 2022-11-17 LAB — ALPHA-1-ANTITRYPSIN PHENOTYP: A-1 Antitrypsin: 171 mg/dL (ref 101–187)

## 2022-12-09 ENCOUNTER — Ambulatory Visit: Payer: Medicare Other | Admitting: Family Medicine

## 2022-12-10 ENCOUNTER — Encounter: Payer: Self-pay | Admitting: Family Medicine

## 2022-12-10 ENCOUNTER — Ambulatory Visit (INDEPENDENT_AMBULATORY_CARE_PROVIDER_SITE_OTHER): Payer: Medicare Other | Admitting: Family Medicine

## 2022-12-10 VITALS — BP 129/67 | HR 90 | Temp 97.7°F | Ht 66.0 in | Wt 154.8 lb

## 2022-12-10 DIAGNOSIS — Z125 Encounter for screening for malignant neoplasm of prostate: Secondary | ICD-10-CM

## 2022-12-10 DIAGNOSIS — E785 Hyperlipidemia, unspecified: Secondary | ICD-10-CM

## 2022-12-10 DIAGNOSIS — M5126 Other intervertebral disc displacement, lumbar region: Secondary | ICD-10-CM

## 2022-12-10 DIAGNOSIS — Z79899 Other long term (current) drug therapy: Secondary | ICD-10-CM

## 2022-12-10 DIAGNOSIS — I10 Essential (primary) hypertension: Secondary | ICD-10-CM

## 2022-12-10 MED ORDER — HYDROCODONE-ACETAMINOPHEN 5-325 MG PO TABS: 0.5000 | ORAL_TABLET | Freq: Four times a day (QID) | ORAL | 0 refills | Status: DC | PRN

## 2022-12-10 NOTE — Progress Notes (Signed)
Subjective:  Patient ID: Joshua Bowman, male    DOB: [birth year]  Age: 77 y.o. MRN: [id number]  CC: Medical Management of Chronic Issues   HPI Joshua Bowman presents for hydrocodone helping with pain. Some pain in legs. Doing well for his back. PDMP review shows appropriate utilization of the patient's controlled medications. Followed by Dr. Melvyn Novas for lungs. Having some mucous plugs. Using a ICS for pulmonary exercises.      12/10/2022    9:44 AM 09/09/2022   12:54 PM 06/10/2022    1:05 PM  Depression screen PHQ 2/9  Decreased Interest 0 0 1  Down, Depressed, Hopeless 0 0 0  PHQ - 2 Score 0 0 1  Altered sleeping   0  Tired, decreased energy   1  Change in appetite   0  Feeling bad or failure about yourself    0  Trouble concentrating   0  Moving slowly or fidgety/restless   0  Suicidal thoughts   0  PHQ-9 Score   2  Difficult doing work/chores   Not difficult at all    History Joshua Bowman has a past medical history of Anxiety, Arthritis, Bilateral carotid artery stenosis, Colon polyps - attenuated polyposis syndrome, COPD (chronic obstructive pulmonary disease) ([place]), Duodenitis, Gastritis, Hemorrhoids, Hyperlipidemia, Hyperplasia of prostate, IBS (irritable bowel syndrome), Pre-diabetes, and Sigmoid diverticulosis.   He has a past surgical history that includes Appendectomy (1969); nasal surgery ( spurs ) (70's); lipoma rt shoulder (01/2001); rt. shoulder -rotator cuff repair (08/2005); Colonoscopy; Esophagogastroduodenoscopy; Tonsillectomy; and Eye surgery.   His family history includes Arthritis in his sister; Congestive Heart Failure in his father; Heart attack in his brother; Heart disease in his brother and mother; Post-traumatic stress disorder in his father; Prostate cancer in his brother.He reports that he has been smoking cigarettes. He started smoking about 42 years ago. He has a 45.00 pack-year smoking history. He has quit using smokeless tobacco. He reports that he does not  currently use alcohol. He reports that he does not use drugs.    ROS Review of Systems  Constitutional:  Negative for fever.  Respiratory:  Negative for shortness of breath.   Cardiovascular:  Negative for chest pain.  Musculoskeletal:  Negative for arthralgias.  Skin:  Negative for rash.    Objective:  BP 129/67   Pulse 90   Temp 97.7 F (36.5 C)   Ht '5\' 6"'$  (1.676 m)   Wt 154 lb 12.8 oz (70.2 kg)   SpO2 93%   BMI 24.99 kg/m   BP Readings from Last 3 Encounters:  12/10/22 129/67  11/11/22 130/68  09/09/22 132/76    Wt Readings from Last 3 Encounters:  12/10/22 154 lb 12.8 oz (70.2 kg)  11/11/22 153 lb 12.8 oz (69.8 kg)  09/09/22 154 lb 3.2 oz (69.9 kg)     Physical Exam Vitals reviewed.  Constitutional:      Appearance: He is well-developed.  HENT:     Head: Normocephalic and atraumatic.     Right Ear: External ear normal.     Left Ear: External ear normal.     Mouth/Throat:     Pharynx: No oropharyngeal exudate or posterior oropharyngeal erythema.  Eyes:     Pupils: Pupils are equal, round, and reactive to light.  Cardiovascular:     Rate and Rhythm: Normal rate and regular rhythm.     Heart sounds: No murmur heard. Pulmonary:     Effort: No respiratory distress.  Breath sounds: Normal breath sounds.  Musculoskeletal:     Cervical back: Normal range of motion and neck supple.  Neurological:     Mental Status: He is alert and oriented to person, place, and time.       Assessment & Plan:   Joshua Bowman was seen today for medical management of chronic issues.  Diagnoses and all orders for this visit:  Primary hypertension -     CBC with Differential/Platelet -     CMP14+EGFR  Hyperlipidemia, unspecified hyperlipidemia type -     Lipid panel  Screening for prostate cancer -     PSA, total and free  Controlled substance agreement signed -     ToxASSURE Select 13 (MW), Urine  Lumbar herniated disc  Other orders -      HYDROcodone-acetaminophen (NORCO/VICODIN) 5-325 MG tablet; Take 0.5 tablets by mouth every 6 (six) hours as needed for moderate pain. -     HYDROcodone-acetaminophen (NORCO/VICODIN) 5-325 MG tablet; Take 0.5 tablets by mouth every 6 (six) hours as needed for moderate pain. -     HYDROcodone-acetaminophen (NORCO/VICODIN) 5-325 MG tablet; Take 0.5 tablets by mouth every 6 (six) hours as needed for moderate pain.       I have discontinued Joshua Bowman predniSONE. I am also having him maintain his aspirin, Vitamin D3, diclofenac sodium, albuterol, budesonide-formoterol, DULoxetine, rosuvastatin, tamsulosin, HYDROcodone-acetaminophen, HYDROcodone-acetaminophen, and HYDROcodone-acetaminophen. We will continue to administer betamethasone acetate-betamethasone sodium phosphate and betamethasone acetate-betamethasone sodium phosphate.  Allergies as of 12/10/2022       Reactions   Lipitor [atorvastatin] Other (See Comments)   myalgias   Sulfa Antibiotics Itching, Rash        Medication List        Accurate as of December 10, 2022 10:21 AM. If you have any questions, ask your nurse or doctor.          STOP taking these medications    predniSONE 10 MG tablet Commonly known as: DELTASONE Stopped by: Claretta Fraise, MD       TAKE these medications    albuterol 108 (90 Base) MCG/ACT inhaler Commonly known as: VENTOLIN HFA Inhale 2 puffs into the lungs every 4 (four) hours as needed for wheezing or shortness of breath.   aspirin 325 MG tablet Take 325 mg by mouth every other day.   budesonide-formoterol 160-4.5 MCG/ACT inhaler Commonly known as: Symbicort INHALE 2 PUFFS INTO THE LUNGS TWICE A DAY   diclofenac sodium 1 % Gel Commonly known as: Voltaren Apply 4 g topically 2 (two) times daily.   DULoxetine 60 MG capsule Commonly known as: CYMBALTA Take 1 capsule (60 mg total) by mouth 2 (two) times daily.   HYDROcodone-acetaminophen 5-325 MG tablet Commonly known as:  NORCO/VICODIN Take 0.5 tablets by mouth every 6 (six) hours as needed for moderate pain. What changed: Another medication with the same name was changed. Make sure you understand how and when to take each. Changed by: Claretta Fraise, MD   HYDROcodone-acetaminophen 5-325 MG tablet Commonly known as: NORCO/VICODIN Take 0.5 tablets by mouth every 6 (six) hours as needed for moderate pain. Start taking on: January 09, 2023 What changed: These instructions start on January 09, 2023. If you are unsure what to do until then, ask your doctor or other care provider. Changed by: Claretta Fraise, MD   HYDROcodone-acetaminophen 5-325 MG tablet Commonly known as: NORCO/VICODIN Take 0.5 tablets by mouth every 6 (six) hours as needed for moderate pain. Start taking on: Feb 07, 2023 What  changed: These instructions start on Feb 07, 2023. If you are unsure what to do until then, ask your doctor or other care provider. Changed by: Claretta Fraise, MD   rosuvastatin 5 MG tablet Commonly known as: Crestor Take one weekly for one month. Then increase two one pill twice a week for one month. Then increase to one three times a week.   tamsulosin 0.4 MG Caps capsule Commonly known as: FLOMAX Take 2 capsules (0.8 mg total) by mouth at bedtime. For urine flow and prostate   Vitamin D3 125 MCG (5000 UT) capsule Generic drug: Cholecalciferol Take 1 tablet by mouth. 2-3 x week         Follow-up: Return in about 3 months (around 03/12/2023).  Claretta Fraise, M.D.

## 2022-12-11 LAB — LIPID PANEL
Chol/HDL Ratio: 2.8 ratio (ref 0.0–5.0)
Cholesterol, Total: 196 mg/dL (ref 100–199)
HDL: 71 mg/dL (ref >39)
LDL Chol Calc (NIH): 115 mg/dL — ABNORMAL HIGH (ref 0–99)
Triglycerides: 55 mg/dL (ref 0–149)
VLDL Cholesterol Cal: 10 mg/dL (ref 5–40)

## 2022-12-11 LAB — CMP14+EGFR
ALT: 10 IU/L (ref 0–44)
AST: 15 IU/L (ref 0–40)
Albumin/Globulin Ratio: 2.1 (ref 1.2–2.2)
Albumin: 4.1 g/dL (ref 3.8–4.8)
Alkaline Phosphatase: 65 IU/L (ref 44–121)
BUN/Creatinine Ratio: 13 (ref 10–24)
BUN: 13 mg/dL (ref 8–27)
Bilirubin Total: 0.4 mg/dL (ref 0.0–1.2)
CO2: 24 mmol/L (ref 20–29)
Calcium: 9.7 mg/dL (ref 8.6–10.2)
Chloride: 100 mmol/L (ref 96–106)
Creatinine, Ser: 0.98 mg/dL (ref 0.76–1.27)
Globulin, Total: 2 g/dL (ref 1.5–4.5)
Glucose: 111 mg/dL — ABNORMAL HIGH (ref 70–99)
Potassium: 4.9 mmol/L (ref 3.5–5.2)
Sodium: 138 mmol/L (ref 134–144)
Total Protein: 6.1 g/dL (ref 6.0–8.5)
eGFR: 80 mL/min/{1.73_m2} (ref >59)

## 2022-12-11 LAB — CBC WITH DIFFERENTIAL/PLATELET
Basophils Absolute: 0.1 10*3/uL (ref 0.0–0.2)
Basos: 1 %
EOS (ABSOLUTE): 0.1 10*3/uL (ref 0.0–0.4)
Eos: 1 %
Hematocrit: 50.6 % (ref 37.5–51.0)
Hemoglobin: 17.7 g/dL (ref 13.0–17.7)
Immature Grans (Abs): 0 10*3/uL (ref 0.0–0.1)
Immature Granulocytes: 0 %
Lymphocytes Absolute: 1.9 10*3/uL (ref 0.7–3.1)
Lymphs: 27 %
MCH: 33.1 pg — ABNORMAL HIGH (ref 26.6–33.0)
MCHC: 35 g/dL (ref 31.5–35.7)
MCV: 95 fL (ref 79–97)
Monocytes Absolute: 0.6 10*3/uL (ref 0.1–0.9)
Monocytes: 9 %
Neutrophils Absolute: 4.4 10*3/uL (ref 1.4–7.0)
Neutrophils: 62 %
Platelets: 248 10*3/uL (ref 150–450)
RBC: 5.34 x10E6/uL (ref 4.14–5.80)
RDW: 13 % (ref 11.6–15.4)
WBC: 7 10*3/uL (ref 3.4–10.8)

## 2022-12-11 LAB — PSA, TOTAL AND FREE
PSA, Free Pct: 10.8 %
PSA, Free: 0.4 ng/mL
Prostate Specific Ag, Serum: 3.7 ng/mL (ref 0.0–4.0)

## 2022-12-13 LAB — TOXASSURE SELECT 13 (MW), URINE

## 2022-12-15 NOTE — Progress Notes (Signed)
Hello Ebb,  Your lab result is normal and/or stable.Some minor variations that are not significant are commonly marked abnormal, but do not represent any medical problem for you.  Best regards, Claretta Fraise, M.D.

## 2022-12-16 ENCOUNTER — Ambulatory Visit (HOSPITAL_COMMUNITY)
Admission: RE | Admit: 2022-12-16 | Discharge: 2022-12-16 | Disposition: A | Discharge status: Home / Self Care | Payer: Medicare Other | Source: Ambulatory Visit | Attending: Internal Medicine | Admitting: Internal Medicine

## 2022-12-16 DIAGNOSIS — J439 Emphysema, unspecified: Secondary | ICD-10-CM | POA: Diagnosis not present

## 2022-12-16 DIAGNOSIS — R9389 Abnormal findings on diagnostic imaging of other specified body structures: Secondary | ICD-10-CM

## 2022-12-16 DIAGNOSIS — R911 Solitary pulmonary nodule: Secondary | ICD-10-CM | POA: Diagnosis not present

## 2022-12-24 NOTE — Progress Notes (Unsigned)
Joshua Bowman, male    DOB: 1945-10-07    MRN: [id number]   Brief patient profile:  63  yowm  active smoker  referred to pulmonary clinic in [place]  11/11/2022 by Claretta Fraise for LDSCT showing ? 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 with CB symptoms worse x 2022   History of Present Illness  11/11/2022  Pulmonary/ 1st office eval/ Eleny Cortez / [place] [facility]  Chief Complaint  Patient presents with   New Patient (Initial Visit)    Saw SG NP for LCS    Dyspnea:  [employer] walks slower than avg pushes cart / no hc parking Cough: thick white, never bloody p coffee seems worse / rattles at bedtime  Sleep: does not disturb sleep  SABA use: 2-3 x per week  02: none Rec Prednisone 10 mg take  4 each am x 2 days,  2 each am x 2 days,  1 each am x 2 days and stop  Continue Symbicort 160  but you must work on inhaler technique:   For cough > mucinex 1200 mg twice daily with a glass of water and use the flutter valve as much as your can  The key is to stop smoking completely before smoking completely stops you!  Please remember to go to the lab department   for your tests - we will call you with the results when they are available.     Please remember to go to the  x-ray department  @  [facility] for your tests - we will call you with the results when they are available     Please schedule a follow up office visit in 6 weeks, call sooner if needed with all medications /inhalers/ solutions in hand         12/25/2022  f/u ov/[place] [facility]/Aurie Harroun re: AB maint on symbicort 160 2bid  did not bring meds none / pfts for Feb 03 2023  Chief Complaint  Patient presents with   Follow-up    Breathing is better since using flutter valve  Dyspnea:  no change = MMRC2 = can't walk a nl pace on a flat grade s sob but does fine slow and flat  Cough: horn sound  Sleeping: bed is flat/ lies on side mostly does fine  SABA use: maybe once a day  02: none     No obvious day to day or daytime  variability or assoc excess/ purulent sputum or mucus plugs or hemoptysis or cp or chest tightness, subjective wheeze or overt sinus or hb symptoms.   Sleeping  without nocturnal  or early am exacerbation  of respiratory  c/o's or need for noct saba. Also denies any obvious fluctuation of symptoms with weather or environmental changes or other aggravating or alleviating factors except as outlined above   No unusual exposure hx or h/o childhood pna/ asthma or knowledge of premature birth.  Current Allergies, Complete Past Medical History, Past Surgical History, Family History, and Social History were reviewed in [employer] record.  ROS  The following are not active complaints unless bolded Hoarseness, sore throat, dysphagia, dental problems, itching, sneezing,  nasal congestion or discharge of excess mucus or purulent secretions, ear ache,   fever, chills, sweats, unintended wt loss or wt gain, classically pleuritic or exertional cp,  orthopnea pnd or arm/hand swelling  or leg swelling, presyncope, palpitations, abdominal pain, anorexia, nausea, vomiting, diarrhea  or change in bowel habits or change in bladder habits, change  in stools or change in urine, dysuria, hematuria,  rash, arthralgias, visual complaints, headache, numbness, weakness or ataxia or problems with walking or coordination,  change in mood or  memory.        Current Meds  Medication Sig   albuterol (VENTOLIN HFA) 108 (90 Base) MCG/ACT inhaler Inhale 2 puffs into the lungs every 4 (four) hours as needed for wheezing or shortness of breath.   aspirin 325 MG tablet Take 325 mg by mouth every other day.    budesonide-formoterol (SYMBICORT) 160-4.5 MCG/ACT inhaler INHALE 2 PUFFS INTO THE LUNGS TWICE A DAY   Cholecalciferol (VITAMIN D3) 5000 UNITS CAPS Take 1 tablet by mouth. 2-3 x week   diclofenac sodium (VOLTAREN) 1 % GEL Apply 4 g topically 2 (two) times daily.   DULoxetine (CYMBALTA) 60 MG capsule Take 1  capsule (60 mg total) by mouth 2 (two) times daily.   HYDROcodone-acetaminophen (NORCO/VICODIN) 5-325 MG tablet Take 0.5 tablets by mouth every 6 (six) hours as needed for moderate pain.   [START ON 01/09/2023] HYDROcodone-acetaminophen (NORCO/VICODIN) 5-325 MG tablet Take 0.5 tablets by mouth every 6 (six) hours as needed for moderate pain.   [START ON 02/07/2023] HYDROcodone-acetaminophen (NORCO/VICODIN) 5-325 MG tablet Take 0.5 tablets by mouth every 6 (six) hours as needed for moderate pain.   tamsulosin (FLOMAX) 0.4 MG CAPS capsule Take 2 capsules (0.8 mg total) by mouth at bedtime. For urine flow and prostate   Current Facility-Administered Medications for the 12/25/22 encounter (Office Visit) with Tanda Rockers, MD  Medication   betamethasone acetate-betamethasone sodium phosphate (CELESTONE) injection 6 mg   betamethasone acetate-betamethasone sodium phosphate (CELESTONE) injection 6 mg                Past Medical History:  Diagnosis Date   Anxiety    Arthritis    Bilateral carotid artery stenosis    Minimal 2012    Colon polyps - attenuated polyposis syndrome    multiple    COPD (chronic obstructive pulmonary disease) (HCC)    Duodenitis    Gastritis    Hemorrhoids    Hyperlipidemia    Hyperplasia of prostate    IBS (irritable bowel syndrome)    Pre-diabetes    Sigmoid diverticulosis    mild         Objective:    Wt Readings from Last 3 Encounters:  12/25/22 154 lb 12.8 oz (70.2 kg)  12/10/22 154 lb 12.8 oz (70.2 kg)  11/11/22 153 lb 12.8 oz (69.8 kg)    Vital signs reviewed  12/25/2022  - Note at rest 02 sats  95% on RA   General appearance:    slt frail elderly wm  struggles a bit to get out of chair and on exam table  HEENT :  Oropharynx  clear      NECK :  without JVD/Nodes/TM/ nl carotid upstrokes bilaterally   LUNGS: no acc muscle use,  Mod barrel  contour chest wall with bilateral  Distant bs s audible wheeze and  without cough on insp or exp  maneuvers and mod  Hyperresonant  to  percussion bilaterally     CV:  RRR  no s3 or murmur or increase in P2, and no edema   ABD:  soft and nontender with pos mid insp Hoover's  in the supine position. No bruits or organomegaly appreciated, bowel sounds nl  MS:   Ext warm without deformities or   obvious joint restrictions , calf tenderness, cyanosis or clubbing  SKIN: warm and dry without lesions    NEURO:  alert, approp, nl sensorium with  no motor or cerebellar deficits apparent.          I personally reviewed images and agree with radiology impression as follows:   Chest CT s contrast   12/16/22 1. Scarring identified within the right middle lobe accounting for the chest radiograph abnormality. 2. Stable appearance of small left lung nodules. Attention in these nodules on the next annual lung cancer screening CT. 3. Bilateral fat density nodules are identified within the adrenal glands compatible with benign adenomas. No follow-up imaging recommended. 4. Aortic Atherosclerosis (ICD10-I70.0) and Emphysema (ICD10-J43.9).              Assessment

## 2022-12-25 ENCOUNTER — Ambulatory Visit: Payer: Medicare Other | Admitting: Internal Medicine

## 2022-12-25 ENCOUNTER — Encounter: Payer: Self-pay | Admitting: Internal Medicine

## 2022-12-25 VITALS — BP 128/78 | HR 86 | Ht 66.0 in | Wt 154.8 lb

## 2022-12-25 DIAGNOSIS — R9389 Abnormal findings on diagnostic imaging of other specified body structures: Secondary | ICD-10-CM | POA: Diagnosis not present

## 2022-12-25 DIAGNOSIS — J449 Chronic obstructive pulmonary disease, unspecified: Secondary | ICD-10-CM

## 2022-12-25 DIAGNOSIS — F17210 Nicotine dependence, cigarettes, uncomplicated: Secondary | ICD-10-CM | POA: Diagnosis not present

## 2022-12-25 MED ORDER — PREDNISONE 10 MG PO TABS: ORAL_TABLET | ORAL | 0 refills | Status: DC

## 2022-12-25 NOTE — Assessment & Plan Note (Addendum)
Active smoker/MM - Labs ordered 11/11/2022  :  allergy screen Eos 0.1  alpha one AT phenotype MM level 171  - 11/11/2022  After extensive coaching inhaler device,  effectiveness =    75% from baseline 25% > continue symbicort 160 2bid/ mucinex/ flutter valve ordered   - 11/11/2022   Walked on RA  x  3  lap(s) =  approx 450  ft  @ slow pace, stopped due to end of study with lowest 02 sats 91% and mild sob    - The proper method of use, as well as anticipated side effects, of a metered-dose inhaler were discussed and demonstrated to the patient using teach back method using empty symbicort device.   Group D (now reclassified as E) in terms of symptom/risk and laba/lama/ICS  therefore appropriate rx at this point >>>  can't tol lama so best we can do is symbicort 160 2bid pending pfts in May 2024

## 2022-12-25 NOTE — Assessment & Plan Note (Signed)
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

## 2022-12-25 NOTE — Patient Instructions (Signed)
No change in medications  Work on inhaler technique:  relax and gently blow all the way out then take a nice smooth full deep breath back in, triggering the inhaler at same time you start breathing in.  Hold breath in for at least  5 seconds if you can. Blow out symbicort  thru nose. Rinse and gargle with water when done.  If mouth or throat bother you at all,  try brushing teeth/gums/tongue with arm and hammer toothpaste/ make a slurry and gargle and spit out.   If any worse with allergy season > Prednisone 10 mg take  4 each am x 2 days,   2 each am x 2 days,  1 each am x 2 days and stop   The key is to stop smoking completely before smoking completely stops you!   Please schedule a follow up visit in 6 months but call sooner if needed with inhalers in hand

## 2022-12-25 NOTE — Assessment & Plan Note (Signed)
Counseled re importance of smoking cessation but did not meet time criteria for separate billing    F/u 6 m with all meds in hand using a trust but verify approach to confirm accurate Medication  Reconciliation The principal here is that until we are certain that the  patients are doing what we've asked, it makes no sense to ask them to do more.   Each maintenance medication was reviewed in detail including emphasizing most importantly the difference between maintenance and prns and under what circumstances the prns are to be triggered using an action plan format where appropriate.  Total time for H and P, chart review, counseling, reviewing hfa device(s) and generating customized AVS unique to this office visit / same day charting > 30 min

## 2023-02-03 ENCOUNTER — Ambulatory Visit (HOSPITAL_COMMUNITY)
Admission: RE | Admit: 2023-02-03 | Discharge: 2023-02-03 | Disposition: A | Discharge status: Home / Self Care | Payer: Medicare Other | Source: Ambulatory Visit | Attending: Internal Medicine | Admitting: Internal Medicine

## 2023-02-03 DIAGNOSIS — J449 Chronic obstructive pulmonary disease, unspecified: Secondary | ICD-10-CM | POA: Insufficient documentation

## 2023-02-03 LAB — PULMONARY FUNCTION TEST
DL/VA % pred: 57 %
DL/VA: 2.26 ml/min/mmHg/L
DLCO unc % pred: 60 %
DLCO unc: 13.07 ml/min/mmHg
FEF 25-75 Post: 0.37 L/sec
FEF 25-75 Pre: 0.3 L/sec
FEF2575-%Change-Post: 24 %
FEF2575-%Pred-Post: 20 %
FEF2575-%Pred-Pre: 16 %
FEV1-%Change-Post: 26 %
FEV1-%Pred-Post: 45 %
FEV1-%Pred-Pre: 35 %
FEV1-Post: 1.13 L
FEV1-Pre: 0.89 L
FEV1FVC-%Change-Post: 2 %
FEV1FVC-%Pred-Pre: 38 %
FEV6-%Change-Post: 9 %
FEV6-%Pred-Post: 79 %
FEV6-%Pred-Pre: 72 %
FEV6-Post: 2.58 L
FEV6-Pre: 2.36 L
FEV6FVC-%Change-Post: -11 %
FEV6FVC-%Pred-Post: 70 %
FEV6FVC-%Pred-Pre: 79 %
FVC-%Change-Post: 23 %
FVC-%Pred-Post: 112 %
FVC-%Pred-Pre: 91 %
FVC-Post: 3.94 L
FVC-Pre: 3.19 L
Post FEV1/FVC ratio: 29 %
Post FEV6/FVC ratio: 65 %
Pre FEV1/FVC ratio: 28 %
Pre FEV6/FVC Ratio: 74 %
RV % pred: 268 %
RV: 6.37 L
TLC % pred: 161 %
TLC: 10.05 L

## 2023-02-03 MED ORDER — ALBUTEROL SULFATE (2.5 MG/3ML) 0.083% IN NEBU
2.5000 mg | INHALATION_SOLUTION | Freq: Once | RESPIRATORY_TRACT | Status: AC
  Administered 2023-02-03: 2.5 mg via RESPIRATORY_TRACT

## 2023-02-16 NOTE — Progress Notes (Signed)
Called the pt and there was no answer- LMTCB    

## 2023-02-17 ENCOUNTER — Telehealth: Payer: Self-pay | Admitting: Internal Medicine

## 2023-02-17 NOTE — Telephone Encounter (Signed)
Patient is returning phone call. Patient phone number is [phone].

## 2023-02-17 NOTE — Telephone Encounter (Signed)
Joshua Cowden, MD 02/09/2023  8:57 AM EDT     Call patient :  Study is c/w moderate COPD as expected and no immediate need to change rx >  Be sure patient has/keeps f/u ov so we can go over all the details of this study and get a plan together moving forward - ok to move up f/u if not feeling better and wants to be seen sooner    Spoke with pt and notified of results per Dr. Sherene Sires. Pt verbalized understanding and denied any questions.

## 2023-03-03 ENCOUNTER — Ambulatory Visit (INDEPENDENT_AMBULATORY_CARE_PROVIDER_SITE_OTHER): Payer: Medicare Other

## 2023-03-03 VITALS — Ht 66.0 in | Wt 152.0 lb

## 2023-03-03 DIAGNOSIS — Z0000 Encounter for general adult medical examination without abnormal findings: Secondary | ICD-10-CM

## 2023-03-03 NOTE — Patient Instructions (Signed)
[name] , Thank you for taking time to come for your Medicare Wellness Visit. I appreciate your ongoing commitment to your health goals. Please review the following plan we discussed and let me know if I can assist you in the future.   These are the goals we discussed:  Goals      DIET - INCREASE WATER INTAKE     Exercise 3x per week (30 min per time)     02/27/2022  AWV Goal: Exercise for General Health  Patient will verbalize understanding of the benefits of increased physical activity: Exercising regularly is important. It will improve your overall fitness, flexibility, and endurance. Regular exercise also will improve your overall health. It can help you control your weight, reduce stress, and improve your bone density. Over the next year, patient will increase physical activity as tolerated with a goal of at least 150 minutes of moderate physical activity per week.  You can tell that you are exercising at a moderate intensity if your heart starts beating faster and you start breathing faster but can still hold a conversation. Moderate-intensity exercise ideas include: Walking 1 mile (1.6 km) in about 15 minutes Biking Hiking Golfing Dancing Water aerobics Patient will verbalize understanding of everyday activities that increase physical activity by providing examples like the following: Yard work, such as: Insurance underwriter Gardening Washing windows or floors Patient will be able to explain general safety guidelines for exercising:  Before you start a new exercise program, talk with your health care provider. Do not exercise so much that you hurt yourself, feel dizzy, or get very short of breath. Wear comfortable clothes and wear shoes with good support. Drink plenty of water while you exercise to prevent dehydration or heat stroke. Work out until your breathing and your heartbeat get faster.       Quit Smoking     02/27/2022 AWV Goal: Tobacco Cessation  Smoking cessation instruction/counseling given:  counseled patient on the dangers of tobacco use, advised patient to stop smoking, and reviewed strategies to maximize success  Patient will verbalize understanding of the health risks associated with smoking/tobacco use Lung cancer or lung disease, such as COPD Heart disease. Stroke. Heart attack Infertility Osteoporosis and bone fractures. Patient will create a plan to quit smoking/using tobacco Pick a date to quit.  Write down the reasons why you are quitting and put it where you will see it often. Identify the people, places, things, and activities that make you want to smoke (triggers) and avoid them. Make sure to take these actions: Throw away all cigarettes at home, at work, and in your car. Throw away smoking accessories, such as Set designer. Clean your car and make sure to empty the ashtray. Clean your home, including curtains and carpets. Tell your family, friends, and coworkers that you are quitting. Support from your loved ones can make quitting easier. Talk with your health care provider about your options for quitting smoking. Find out what treatment options are covered by your health insurance. Patient will be able to demonstrate knowledge of tobacco cessation strategies that may maximize success Quitting "cold [place]" is more successful than gradually quitting. Attending in-person counseling to help you build problem-solving skills.  Finding resources and support systems that can help you to quit smoking and remain smoke-free after you quit. These resources are most helpful when you use them often. They can include: Online chats with a  counselor. Telephone quitlines. Printed Materials engineer. Support groups or group counseling. Text messaging programs. Mobile phone applications. Taking medicines to help you quit smoking: Nicotine patches, gum,  or lozenges. Nicotine inhalers or sprays. Non-nicotine medicine that is taken by mouth. Patient will note get discouraged if the process is difficult Over the next year, patient will stop smoking or using other forms of tobacco  Smoking cessation instruction/counseling given:  counseled patient on the dangers of tobacco use, advised patient to stop smoking, and reviewed strategies to maximize success          This is a list of the screening recommended for you and due dates:  Health Maintenance  Topic Date Due   Zoster (Shingles) Vaccine (1 of 2) Never done   Colon Cancer Screening  11/19/2018   COVID-19 Vaccine (5 - 2023-24 season) 05/30/2022   Flu Shot  04/30/2023   Screening for Lung Cancer  12/16/2023   Medicare Annual Wellness Visit  03/02/2024   DTaP/Tdap/Td vaccine (2 - Tdap) 12/04/2027   Pneumonia Vaccine  Completed   Hepatitis C Screening  Completed   HPV Vaccine  Aged Out    Advanced directives: Advance directive discussed with you today. I have provided a copy for you to complete at home and have notarized. Once this is complete please bring a copy in to our office so we can scan it into your chart.   Conditions/risks identified: Aim for 30 minutes of exercise or brisk walking, 6-8 glasses of water, and 5 servings of fruits and vegetables each day.   Next appointment: Follow up in one year for your annual wellness visit.   Preventive Care 37 Years and Older, Male  Preventive care refers to lifestyle choices and visits with your health care provider that can promote health and wellness. What does preventive care include? A yearly physical exam. This is also called an annual well check. Dental exams once or twice a year. Routine eye exams. Ask your health care provider how often you should have your eyes checked. Personal lifestyle choices, including: Daily care of your teeth and gums. Regular physical activity. Eating a healthy diet. Avoiding tobacco and drug  use. Limiting alcohol use. Practicing safe sex. Taking low doses of aspirin every day. Taking vitamin and mineral supplements as recommended by your health care provider. What happens during an annual well check? The services and screenings done by your health care provider during your annual well check will depend on your age, overall health, lifestyle risk factors, and family history of disease. Counseling  Your health care provider may ask you questions about your: Alcohol use. Tobacco use. Drug use. Emotional well-being. Home and relationship well-being. Sexual activity. Eating habits. History of falls. Memory and ability to understand (cognition). Work and work Astronomer. Screening  You may have the following tests or measurements: Height, weight, and BMI. Blood pressure. Lipid and cholesterol levels. These may be checked every 5 years, or more frequently if you are over 59 years old. Skin check. Lung cancer screening. You may have this screening every year starting at age 52 if you have a 30-pack-year history of smoking and currently smoke or have quit within the past 15 years. Fecal occult blood test (FOBT) of the stool. You may have this test every year starting at age 41. Flexible sigmoidoscopy or colonoscopy. You may have a sigmoidoscopy every 5 years or a colonoscopy every 10 years starting at age 105. Prostate cancer screening. Recommendations will vary depending on your family history  and other risks. Hepatitis C blood test. Hepatitis B blood test. Sexually transmitted disease (STD) testing. Diabetes screening. This is done by checking your blood sugar (glucose) after you have not eaten for a while (fasting). You may have this done every 1-3 years. Abdominal aortic aneurysm (AAA) screening. You may need this if you are a current or former smoker. Osteoporosis. You may be screened starting at age 46 if you are at high risk. Talk with your health care provider about  your test results, treatment options, and if necessary, the need for more tests. Vaccines  Your health care provider may recommend certain vaccines, such as: Influenza vaccine. This is recommended every year. Tetanus, diphtheria, and acellular pertussis (Tdap, Td) vaccine. You may need a Td booster every 10 years. Zoster vaccine. You may need this after age 60. Pneumococcal 13-valent conjugate (PCV13) vaccine. One dose is recommended after age 49. Pneumococcal polysaccharide (PPSV23) vaccine. One dose is recommended after age 70. Talk to your health care provider about which screenings and vaccines you need and how often you need them. This information is not intended to replace advice given to you by your health care provider. Make sure you discuss any questions you have with your health care provider. Document Released: 10/12/2015 Document Revised: 06/04/2016 Document Reviewed: 07/17/2015 Elsevier Interactive Patient Education  2017 [employer].  Fall Prevention in the Home Falls can cause injuries. They can happen to people of all ages. There are many things you can do to make your home safe and to help prevent falls. What can I do on the outside of my home? Regularly fix the edges of walkways and driveways and fix any cracks. Remove anything that might make you trip as you walk through a door, such as a raised step or threshold. Trim any bushes or trees on the path to your home. Use bright outdoor lighting. Clear any walking paths of anything that might make someone trip, such as rocks or tools. Regularly check to see if handrails are loose or broken. Make sure that both sides of any steps have handrails. Any raised decks and porches should have guardrails on the edges. Have any leaves, snow, or ice cleared regularly. Use sand or salt on walking paths during winter. Clean up any spills in your garage right away. This includes oil or grease spills. What can I do in the bathroom? Use  night lights. Install grab bars by the toilet and in the tub and shower. Do not use towel bars as grab bars. Use non-skid mats or decals in the tub or shower. If you need to sit down in the shower, use a plastic, non-slip stool. Keep the floor dry. Clean up any water that spills on the floor as soon as it happens. Remove soap buildup in the tub or shower regularly. Attach bath mats securely with double-sided non-slip rug tape. Do not have throw rugs and other things on the floor that can make you trip. What can I do in the bedroom? Use night lights. Make sure that you have a light by your bed that is easy to reach. Do not use any sheets or blankets that are too big for your bed. They should not hang down onto the floor. Have a firm chair that has side arms. You can use this for support while you get dressed. Do not have throw rugs and other things on the floor that can make you trip. What can I do in the kitchen? Clean up any spills  right away. Avoid walking on wet floors. Keep items that you use a lot in easy-to-reach places. If you need to reach something above you, use a strong step stool that has a grab bar. Keep electrical cords out of the way. Do not use floor polish or wax that makes floors slippery. If you must use wax, use non-skid floor wax. Do not have throw rugs and other things on the floor that can make you trip. What can I do with my stairs? Do not leave any items on the stairs. Make sure that there are handrails on both sides of the stairs and use them. Fix handrails that are broken or loose. Make sure that handrails are as long as the stairways. Check any carpeting to make sure that it is firmly attached to the stairs. Fix any carpet that is loose or worn. Avoid having throw rugs at the top or bottom of the stairs. If you do have throw rugs, attach them to the floor with carpet tape. Make sure that you have a light switch at the top of the stairs and the bottom of the  stairs. If you do not have them, ask someone to add them for you. What else can I do to help prevent falls? Wear shoes that: Do not have high heels. Have rubber bottoms. Are comfortable and fit you well. Are closed at the toe. Do not wear sandals. If you use a stepladder: Make sure that it is fully opened. Do not climb a closed stepladder. Make sure that both sides of the stepladder are locked into place. Ask someone to hold it for you, if possible. Clearly mark and make sure that you can see: Any grab bars or handrails. First and last steps. Where the edge of each step is. Use tools that help you move around (mobility aids) if they are needed. These include: Canes. Walkers. Scooters. Crutches. Turn on the lights when you go into a dark area. Replace any light bulbs as soon as they burn out. Set up your furniture so you have a clear path. Avoid moving your furniture around. If any of your floors are uneven, fix them. If there are any pets around you, be aware of where they are. Review your medicines with your doctor. Some medicines can make you feel dizzy. This can increase your chance of falling. Ask your doctor what other things that you can do to help prevent falls. This information is not intended to replace advice given to you by your health care provider. Make sure you discuss any questions you have with your health care provider. Document Released: 07/12/2009 Document Revised: 02/21/2016 Document Reviewed: 10/20/2014 Elsevier Interactive Patient Education  2017 [employer].

## 2023-03-03 NOTE — Progress Notes (Signed)
Subjective:   Joshua Bowman is a 77 y.o. male who presents for Medicare Annual/Subsequent preventive examination. I connected with  Joshua Bowman on 03/03/23 by a audio enabled telemedicine application and verified that I am speaking with the correct person using two identifiers.  Patient Location: Home  Provider Location: Home Office  I discussed the limitations of evaluation and management by telemedicine. The patient expressed understanding and agreed to proceed.  Review of Systems     Cardiac Risk Factors include: advanced age (>72men, >63 women);male gender;smoking/ tobacco exposure     Objective:    Today's Vitals   03/03/23 1336  Weight: 152 lb (68.9 kg)  Height: 5\' 6"  (1.676 m)   Body mass index is 24.53 kg/m.     03/03/2023    1:38 PM 02/27/2022    1:24 PM 10/30/2021    2:39 PM 02/26/2021    2:56 PM 02/23/2020    2:52 PM 02/16/2019    3:45 PM 11/19/2017    1:05 PM  Advanced Directives  Does Patient Have a Medical Advance Directive? No No No No No No No  Would patient like information on creating a medical advance directive? No - Patient declined No - Patient declined  Yes (MAU/Ambulatory/Procedural Areas - Information given) Yes (MAU/Ambulatory/Procedural Areas - Information given) No - Patient declined No - Patient declined    Current Medications (verified) Outpatient Encounter Medications as of 03/03/2023  Medication Sig   albuterol (VENTOLIN HFA) 108 (90 Base) MCG/ACT inhaler Inhale 2 puffs into the lungs every 4 (four) hours as needed for wheezing or shortness of breath.   aspirin 325 MG tablet Take 325 mg by mouth every other day.    budesonide-formoterol (SYMBICORT) 160-4.5 MCG/ACT inhaler INHALE 2 PUFFS INTO THE LUNGS TWICE A DAY   Cholecalciferol (VITAMIN D3) 5000 UNITS CAPS Take 1 tablet by mouth. 2-3 x week   diclofenac sodium (VOLTAREN) 1 % GEL Apply 4 g topically 2 (two) times daily.   DULoxetine (CYMBALTA) 60 MG capsule Take 1 capsule (60 mg total) by  mouth 2 (two) times daily.   HYDROcodone-acetaminophen (NORCO/VICODIN) 5-325 MG tablet Take 0.5 tablets by mouth every 6 (six) hours as needed for moderate pain.   HYDROcodone-acetaminophen (NORCO/VICODIN) 5-325 MG tablet Take 0.5 tablets by mouth every 6 (six) hours as needed for moderate pain.   HYDROcodone-acetaminophen (NORCO/VICODIN) 5-325 MG tablet Take 0.5 tablets by mouth every 6 (six) hours as needed for moderate pain.   predniSONE (DELTASONE) 10 MG tablet Take  4 each am x 2 days,   2 each am x 2 days,  1 each am x 2 days and stop   tamsulosin (FLOMAX) 0.4 MG CAPS capsule Take 2 capsules (0.8 mg total) by mouth at bedtime. For urine flow and prostate   [DISCONTINUED] albuterol (VENTOLIN HFA) 108 (90 Base) MCG/ACT inhaler Inhale 2 puffs into the lungs every 4 (four) hours as needed for wheezing or shortness of breath.   [DISCONTINUED] budesonide-formoterol (SYMBICORT) 160-4.5 MCG/ACT inhaler INHALE 2 PUFFS INTO THE LUNGS TWICE A DAY   [DISCONTINUED] DULoxetine (CYMBALTA) 60 MG capsule Take 1 capsule (60 mg total) by mouth 2 (two) times daily.   [DISCONTINUED] fexofenadine (ALLEGRA) 180 MG tablet Take 1 tablet (180 mg total) by mouth daily. For allergy symptoms   [DISCONTINUED] HYDROcodone-acetaminophen (NORCO/VICODIN) 5-325 MG tablet Take 0.5 tablets by mouth every 6 (six) hours as needed for moderate pain.   [DISCONTINUED] HYDROcodone-acetaminophen (NORCO/VICODIN) 5-325 MG tablet Take 0.5 tablets by mouth every 6 (  six) hours as needed for moderate pain.   [DISCONTINUED] HYDROcodone-acetaminophen (NORCO/VICODIN) 5-325 MG tablet Take 0.5 tablets by mouth every 6 (six) hours as needed for moderate pain.   [DISCONTINUED] nabumetone (RELAFEN) 500 MG tablet Take 2 tablets (1,000 mg total) by mouth 2 (two) times daily. For muscle and joint pain   [DISCONTINUED] silodosin (RAPAFLO) 8 MG CAPS capsule Take 1 capsule (8 mg total) by mouth daily with breakfast.   Facility-Administered Encounter  Medications as of 03/03/2023  Medication   betamethasone acetate-betamethasone sodium phosphate (CELESTONE) injection 6 mg   betamethasone acetate-betamethasone sodium phosphate (CELESTONE) injection 6 mg    Allergies (verified) Lipitor [atorvastatin] and Sulfa antibiotics   History: Past Medical History:  Diagnosis Date   Anxiety    Arthritis    Bilateral carotid artery stenosis    Minimal 2012    Colon polyps - attenuated polyposis syndrome    multiple    COPD (chronic obstructive pulmonary disease) (HCC)    Duodenitis    Gastritis    Hemorrhoids    Hyperlipidemia    Hyperplasia of prostate    IBS (irritable bowel syndrome)    Pre-diabetes    Sigmoid diverticulosis    mild    Past Surgical History:  Procedure Laterality Date   APPENDECTOMY  1969   COLONOSCOPY     ESOPHAGOGASTRODUODENOSCOPY     EYE SURGERY     laser - left eye - Dr Ashley Royalty   lipoma rt shoulder  01/2001   outpatient    nasal surgery ( spurs )  70's   rt. shoulder -rotator cuff repair  08/2005   Dr. Jillyn Hidden    TONSILLECTOMY     Family History  Problem Relation Age of Onset   Heart disease Mother        Atrial fib   Prostate cancer Brother    Heart disease Brother    Congestive Heart Failure Father    Post-traumatic stress disorder Father    Arthritis Sister        back issues    Heart attack Brother    Social History   Socioeconomic History   Marital status: Married    Spouse name: Amada Jupiter   Number of children: 1   Years of education: 15   Highest education level: Associate degree: occupational, Scientist, product/process development, or vocational program  Occupational History   Occupation: retired     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  Lives with second wife.    Social Determinants of Health   Financial Resource Strain: Low Risk  (03/03/2023)   Overall Financial Resource Strain (CARDIA)    Difficulty of Paying Living Expenses: Not hard at all  Food Insecurity: No Food Insecurity (03/03/2023)   Hunger Vital Sign    Worried About Running Out of Food in the Last Year: Never true    Ran Out of Food in the Last Year: Never true  Transportation Needs: No  Transportation Needs (03/03/2023)   PRAPARE - Administrator, Civil Service (Medical): No    Lack of Transportation (Non-Medical): No  Physical Activity: Insufficiently Active (03/03/2023)   Exercise Vital Sign    Days of Exercise per Week: 3 days    Minutes of Exercise per Session: 30 min  Stress: No Stress Concern Present (03/03/2023)   [employer] of Occupational Health - Occupational Stress Questionnaire    Feeling of Stress : Not at all  Social Connections: Moderately Integrated (03/03/2023)   Social Connection and Isolation Panel [NHANES]    Frequency of Communication with Friends and Family: More than three times a week    Frequency of Social Gatherings with Friends and Family: More than three times a week    Attends Religious Services: 1 to 4 times per year    Active Member of [employer] or Organizations: No    Attends Engineer, structural: Never    Marital Status: Married    Tobacco Counseling Ready to quit: No Counseling given: Not Answered   Clinical Intake:  Pre-visit preparation completed: Yes  Pain : No/denies pain     Nutritional Risks: None Diabetes: No  How often do you need to have someone help you when you read instructions, pamphlets, or other written materials from your doctor or pharmacy?: 1 - Never  Diabetic?no   Interpreter Needed?:  No  Information entered by :: Renie Ora, LPN   Activities of Daily Living    03/03/2023    1:38 PM  In your present state of health, do you have any difficulty performing the following activities:  Hearing? 0  Vision? 0  Difficulty concentrating or making decisions? 0  Walking or climbing stairs? 0  Dressing or bathing? 0  Doing errands, shopping? 0  Preparing Food and eating ? N  Using the Toilet? N  In the past six months, have you accidently leaked urine? N  Do you have problems with loss of bowel control? N  Managing your Medications? N  Managing your Finances? N  Housekeeping or managing your Housekeeping? N    Patient Care Team: Mechele Claude, MD as PCP - General (Family Medicine) Rollene Rotunda, MD as Consulting Physician (Cardiology) Jene Every, MD as Consulting Physician (Orthopedic Surgery) Iva Boop, MD as Consulting Physician (Gastroenterology) Michaelle Copas, MD as Referring Physician (Optometry) Sherrie George, MD as Consulting Physician (Ophthalmology)  Indicate any recent Medical Services you may have received from other than [facility] providers in the past year (date may be approximate).     Assessment:   This is a routine wellness examination for Hubert.  Hearing/Vision screen Vision Screening - Comments:: Wears rx glasses - up to date with routine eye exams with  Dr.Johnson   Dietary issues and exercise activities discussed: Current Exercise Habits: Home exercise routine, Type of exercise: walking, Time (Minutes): 30, Frequency (Times/Week): 3, Weekly Exercise (Minutes/Week): 90, Intensity: Mild, Exercise limited by: orthopedic condition(s)   Goals Addressed             This Visit's Progress    DIET - INCREASE WATER INTAKE         Depression Screen    03/03/2023    1:37 PM 12/10/2022    9:44 AM 09/09/2022   12:54 PM 06/10/2022    1:05 PM 06/10/2022   12:51 PM 03/13/2022   12:56 PM 03/13/2022   12:50 PM  PHQ 2/9 Scores  PHQ - 2  Score 0 0 0 1  0 1 0  PHQ- 9 Score    2  3     Fall Risk    03/03/2023    1:36 PM 12/10/2022    9:44 AM 09/09/2022   12:54 PM 06/10/2022   12:51 PM 03/13/2022   12:50 PM  Fall Risk   Falls in the past year? 0 0 0 0 0  Number falls in past yr: 0      Injury with Fall? 0      Risk for fall due to : No Fall Risks      Follow up Falls prevention discussed        FALL RISK PREVENTION PERTAINING TO THE HOME:  Any stairs in or around the home? Yes  If so, are there any without handrails? No  Home free of loose throw rugs in walkways, pet beds, electrical cords, etc? Yes  Adequate lighting in your home to reduce risk of falls? Yes   ASSISTIVE DEVICES UTILIZED TO PREVENT FALLS:  Life alert? No  Use of a cane, walker or w/c? No  Grab bars in the bathroom? Yes  Shower chair or bench in shower? Yes  Elevated toilet seat or a handicapped toilet? No          03/03/2023    1:39 PM 02/27/2022    1:24 PM 02/23/2020    2:57 PM 02/16/2019    3:48 PM  6CIT Screen  What Year? 0 points 0 points 0 points 0 points  What month? 0 points 0 points 0 points 0 points  What time? 0 points 0 points 0 points 0 points  Count back from 20 0 points 0 points 0 points 0 points  Months in reverse 0 points 0 points 0 points 0 points  Repeat phrase 0 points 0 points 0 points 0 points  Total Score 0 points 0 points 0 points 0 points    Immunizations Immunization History  Administered Date(s) Administered   Fluad Quad(high Dose 65+) 07/20/2019, 06/12/2020, 09/09/2021, 09/09/2022   Influenza Whole 06/20/2015   Influenza, High Dose Seasonal PF 06/20/2016, 07/22/2018   Influenza,inj,Quad PF,6+ Mos 08/09/2014, 08/04/2017   Moderna Sars-Covid-2 Vaccination 11/23/2019, 12/21/2019, 10/09/2020, 02/19/2021   Pneumococcal Conjugate-13 12/06/2013   Pneumococcal Polysaccharide-23 11/19/2011   Td 12/03/2017    TDAP status: Up to date  Flu Vaccine status: Up to date  Pneumococcal vaccine status: Up to  date  Covid-19 vaccine status: Completed vaccines  Qualifies for Shingles Vaccine? Yes   Zostavax completed No   Shingrix Completed?: No.    Education has been provided regarding the importance of this vaccine. Patient has been advised to call insurance company to determine out of pocket expense if they have not yet received this vaccine. Advised may also receive vaccine at local pharmacy or Health Dept. Verbalized acceptance and understanding.  Screening Tests Health Maintenance  Topic Date Due   Zoster Vaccines- Shingrix (1 of 2) Never done   Colonoscopy  11/19/2018   COVID-19 Vaccine (5 - 2023-24 season) 05/30/2022   INFLUENZA VACCINE  04/30/2023   Lung Cancer Screening  12/16/2023   Medicare Annual Wellness (AWV)  03/02/2024   DTaP/Tdap/Td (2 - Tdap) 12/04/2027   Pneumonia Vaccine 66+ Years old  Completed   Hepatitis C Screening  Completed   HPV VACCINES  Aged Out    Health Maintenance  Health Maintenance Due  Topic Date Due   Zoster Vaccines- Shingrix (1 of 2) Never done   Colonoscopy  11/19/2018   COVID-19 Vaccine (5 -  2023-24 season) 05/30/2022    Colorectal cancer screening: No longer required.   Lung Cancer Screening: (Low Dose CT Chest recommended if Age 87-80 years, 30 pack-year currently smoking OR have quit w/in 15years.) does qualify.   Lung Cancer Screening Referral: 12/16/2022  Additional Screening:  Hepatitis C Screening: does not qualify; Completed 03/16/2017  Vision Screening: Recommended annual ophthalmology exams for early detection of glaucoma and other disorders of the eye. Is the patient up to date with their annual eye exam?  Yes  Who is the provider or what is the name of the office in which the patient attends annual eye exams? Dr.Johnson  If pt is not established with a provider, would they like to be referred to a provider to establish care? No .   Dental Screening: Recommended annual dental exams for proper oral hygiene  Community  Resource Referral / Chronic Care Management: CRR required this visit?  No   CCM required this visit?  No      Plan:     I have personally reviewed and noted the following in the patient's chart:   Medical and social history Use of alcohol, tobacco or illicit drugs  Current medications and supplements including opioid prescriptions. Patient is not currently taking opioid prescriptions. Functional ability and status Nutritional status Physical activity Advanced directives List of other physicians Hospitalizations, surgeries, and ER visits in previous 12 months Vitals Screenings to include cognitive, depression, and falls Referrals and appointments  In addition, I have reviewed and discussed with patient certain preventive protocols, quality metrics, and best practice recommendations. A written personalized care plan for preventive services as well as general preventive health recommendations were provided to patient.     Lorrene Reid, LPN   10/04/1094   Nurse Notes: none

## 2023-03-12 ENCOUNTER — Encounter: Payer: Self-pay | Admitting: Family Medicine

## 2023-03-12 ENCOUNTER — Ambulatory Visit (INDEPENDENT_AMBULATORY_CARE_PROVIDER_SITE_OTHER): Payer: Medicare Other | Admitting: Family Medicine

## 2023-03-12 VITALS — BP 127/69 | HR 89 | Temp 97.9°F | Ht 66.0 in | Wt 152.6 lb

## 2023-03-12 DIAGNOSIS — I6523 Occlusion and stenosis of bilateral carotid arteries: Secondary | ICD-10-CM | POA: Diagnosis not present

## 2023-03-12 DIAGNOSIS — I10 Essential (primary) hypertension: Secondary | ICD-10-CM | POA: Diagnosis not present

## 2023-03-12 DIAGNOSIS — E785 Hyperlipidemia, unspecified: Secondary | ICD-10-CM | POA: Diagnosis not present

## 2023-03-12 MED ORDER — HYDROCODONE-ACETAMINOPHEN 5-325 MG PO TABS: 0.5000 | ORAL_TABLET | Freq: Four times a day (QID) | ORAL | 0 refills | Status: DC | PRN

## 2023-03-12 NOTE — Progress Notes (Signed)
Subjective:  Patient ID: Joshua Bowman, male    DOB: [birth year]  Age: 77 y.o. MRN: [id number]  CC: Medical Management of Chronic Issues   HPI TAYJON TRETTEL presents for COPD. Hot and humid days are difficult. Has to stay inside. Can get out early or late.  Pain comes and goes. No changes. Back is intemittent, but knees hurt daily from 5-10/10. Higher end when active.      03/12/2023   12:52 PM 03/03/2023    1:37 PM 12/10/2022    9:44 AM  Depression screen PHQ 2/9  Decreased Interest 0 0 0  Down, Depressed, Hopeless 0 0 0  PHQ - 2 Score 0 0 0    History Romulo has a past medical history of Anxiety, Arthritis, Bilateral carotid artery stenosis, Colon polyps - attenuated polyposis syndrome, COPD (chronic obstructive pulmonary disease) (HCC), Duodenitis, Gastritis, Hemorrhoids, Hyperlipidemia, Hyperplasia of prostate, IBS (irritable bowel syndrome), Pre-diabetes, and Sigmoid diverticulosis.   He has a past surgical history that includes Appendectomy (1969); nasal surgery ( spurs ) (70's); lipoma rt shoulder (01/2001); rt. shoulder -rotator cuff repair (08/2005); Colonoscopy; Esophagogastroduodenoscopy; Tonsillectomy; and Eye surgery.   His family history includes Arthritis in his sister; Congestive Heart Failure in his father; Heart attack in his brother; Heart disease in his brother and mother; Post-traumatic stress disorder in his father; Prostate cancer in his brother.He reports that he has been smoking cigarettes. He started smoking about 42 years ago. He has a 45.00 pack-year smoking history. He has quit using smokeless tobacco. He reports that he does not currently use alcohol. He reports that he does not use drugs.    ROS Review of Systems  Constitutional:  Negative for fever.  Respiratory:  Negative for shortness of breath.   Cardiovascular:  Negative for chest pain.  Musculoskeletal:  Negative for arthralgias.  Skin:  Negative for rash.    Objective:  BP 127/69   Pulse 89    Temp 97.9 F (36.6 C)   Ht 5\' 6"  (1.676 m)   Wt 152 lb 9.6 oz (69.2 kg)   SpO2 90%   BMI 24.63 kg/m   BP Readings from Last 3 Encounters:  03/12/23 127/69  12/25/22 128/78  12/10/22 129/67    Wt Readings from Last 3 Encounters:  03/12/23 152 lb 9.6 oz (69.2 kg)  03/03/23 152 lb (68.9 kg)  12/25/22 154 lb 12.8 oz (70.2 kg)     Physical Exam Vitals reviewed.  Constitutional:      Appearance: He is well-developed.  HENT:     Head: Normocephalic and atraumatic.     Right Ear: External ear normal.     Left Ear: External ear normal.     Mouth/Throat:     Pharynx: No oropharyngeal exudate or posterior oropharyngeal erythema.  Eyes:     Pupils: Pupils are equal, round, and reactive to light.  Cardiovascular:     Rate and Rhythm: Normal rate and regular rhythm.     Heart sounds: No murmur heard. Pulmonary:     Effort: No respiratory distress.     Breath sounds: Normal breath sounds.  Musculoskeletal:     Cervical back: Normal range of motion and neck supple.  Neurological:     Mental Status: He is alert and oriented to person, place, and time.       Assessment & Plan:   Castin was seen today for medical management of chronic issues.  Diagnoses and all orders for this visit:  Primary hypertension -  CBC with Differential/Platelet -     CMP14+EGFR  Hyperlipidemia, unspecified hyperlipidemia type -     Lipid panel  Atherosclerosis of both carotid arteries -     Ambulatory referral to Cardiology  Other orders -     HYDROcodone-acetaminophen (NORCO/VICODIN) 5-325 MG tablet; Take 0.5 tablets by mouth every 6 (six) hours as needed for moderate pain. -     HYDROcodone-acetaminophen (NORCO/VICODIN) 5-325 MG tablet; Take 0.5 tablets by mouth every 6 (six) hours as needed for moderate pain. -     HYDROcodone-acetaminophen (NORCO/VICODIN) 5-325 MG tablet; Take 0.5 tablets by mouth every 6 (six) hours as needed for moderate pain.       I have discontinued Kyrillos  W. Paz's predniSONE. I am also having him maintain his aspirin, Vitamin D3, diclofenac sodium, albuterol, budesonide-formoterol, DULoxetine, tamsulosin, HYDROcodone-acetaminophen, HYDROcodone-acetaminophen, and HYDROcodone-acetaminophen. We will continue to administer betamethasone acetate-betamethasone sodium phosphate and betamethasone acetate-betamethasone sodium phosphate.  Allergies as of 03/12/2023       Reactions   Lipitor [atorvastatin] Other (See Comments)   myalgias   Sulfa Antibiotics Itching, Rash        Medication List        Accurate as of March 12, 2023  1:31 PM. If you have any questions, ask your nurse or doctor.          STOP taking these medications    predniSONE 10 MG tablet Commonly known as: DELTASONE Stopped by: Mechele Claude, MD       TAKE these medications    albuterol 108 (90 Base) MCG/ACT inhaler Commonly known as: VENTOLIN HFA Inhale 2 puffs into the lungs every 4 (four) hours as needed for wheezing or shortness of breath.   aspirin 325 MG tablet Take 325 mg by mouth every other day.   budesonide-formoterol 160-4.5 MCG/ACT inhaler Commonly known as: Symbicort INHALE 2 PUFFS INTO THE LUNGS TWICE A DAY   diclofenac sodium 1 % Gel Commonly known as: Voltaren Apply 4 g topically 2 (two) times daily.   DULoxetine 60 MG capsule Commonly known as: CYMBALTA Take 1 capsule (60 mg total) by mouth 2 (two) times daily.   HYDROcodone-acetaminophen 5-325 MG tablet Commonly known as: NORCO/VICODIN Take 0.5 tablets by mouth every 6 (six) hours as needed for moderate pain. What changed: Another medication with the same name was changed. Make sure you understand how and when to take each. Changed by: Mechele Claude, MD   HYDROcodone-acetaminophen 5-325 MG tablet Commonly known as: NORCO/VICODIN Take 0.5 tablets by mouth every 6 (six) hours as needed for moderate pain. Start taking on: April 11, 2023 What changed: These instructions start on April 11, 2023. If you are unsure what to do until then, ask your doctor or other care provider. Changed by: Mechele Claude, MD   HYDROcodone-acetaminophen 5-325 MG tablet Commonly known as: NORCO/VICODIN Take 0.5 tablets by mouth every 6 (six) hours as needed for moderate pain. Start taking on: May 11, 2023 What changed: These instructions start on May 11, 2023. If you are unsure what to do until then, ask your doctor or other care provider. Changed by: Mechele Claude, MD   tamsulosin 0.4 MG Caps capsule Commonly known as: FLOMAX Take 2 capsules (0.8 mg total) by mouth at bedtime. For urine flow and prostate   Vitamin D3 125 MCG (5000 UT) Caps Take 1 tablet by mouth. 2-3 x week         Follow-up: Return in about 3 months (around 06/12/2023).  Mechele Claude, M.D.

## 2023-03-13 LAB — CBC WITH DIFFERENTIAL/PLATELET
Basophils Absolute: 0.1 10*3/uL (ref 0.0–0.2)
Basos: 1 %
EOS (ABSOLUTE): 0.1 10*3/uL (ref 0.0–0.4)
Eos: 1 %
Hematocrit: 50.4 % (ref 37.5–51.0)
Hemoglobin: 17.5 g/dL (ref 13.0–17.7)
Immature Grans (Abs): 0.1 10*3/uL (ref 0.0–0.1)
Immature Granulocytes: 1 %
Lymphocytes Absolute: 1.9 10*3/uL (ref 0.7–3.1)
Lymphs: 23 %
MCH: 32.2 pg (ref 26.6–33.0)
MCHC: 34.7 g/dL (ref 31.5–35.7)
MCV: 93 fL (ref 79–97)
Monocytes Absolute: 0.8 10*3/uL (ref 0.1–0.9)
Monocytes: 9 %
Neutrophils Absolute: 5.4 10*3/uL (ref 1.4–7.0)
Neutrophils: 65 %
Platelets: 281 10*3/uL (ref 150–450)
RBC: 5.44 x10E6/uL (ref 4.14–5.80)
RDW: 12.7 % (ref 11.6–15.4)
WBC: 8.4 10*3/uL (ref 3.4–10.8)

## 2023-03-13 LAB — LIPID PANEL
Chol/HDL Ratio: 3 ratio (ref 0.0–5.0)
Cholesterol, Total: 185 mg/dL (ref 100–199)
HDL: 61 mg/dL (ref >39)
LDL Chol Calc (NIH): 112 mg/dL — ABNORMAL HIGH (ref 0–99)
Triglycerides: 63 mg/dL (ref 0–149)
VLDL Cholesterol Cal: 12 mg/dL (ref 5–40)

## 2023-03-13 LAB — CMP14+EGFR
ALT: 10 IU/L (ref 0–44)
AST: 15 IU/L (ref 0–40)
Albumin/Globulin Ratio: 1.9
Albumin: 4.2 g/dL (ref 3.8–4.8)
Alkaline Phosphatase: 67 IU/L (ref 44–121)
BUN/Creatinine Ratio: 14 (ref 10–24)
BUN: 13 mg/dL (ref 8–27)
Bilirubin Total: 0.3 mg/dL (ref 0.0–1.2)
CO2: 24 mmol/L (ref 20–29)
Calcium: 9.5 mg/dL (ref 8.6–10.2)
Chloride: 101 mmol/L (ref 96–106)
Creatinine, Ser: 0.91 mg/dL (ref 0.76–1.27)
Globulin, Total: 2.2 g/dL (ref 1.5–4.5)
Glucose: 96 mg/dL (ref 70–99)
Potassium: 4.7 mmol/L (ref 3.5–5.2)
Sodium: 140 mmol/L (ref 134–144)
Total Protein: 6.4 g/dL (ref 6.0–8.5)
eGFR: 87 mL/min/{1.73_m2} (ref >59)

## 2023-03-16 NOTE — Progress Notes (Signed)
Hello Jarrell,  Your lab result is normal and/or stable.Some minor variations that are not significant are commonly marked abnormal, but do not represent any medical problem for you.  Best regards, Avari Nevares, M.D.

## 2023-04-27 ENCOUNTER — Other Ambulatory Visit: Payer: Self-pay | Admitting: Family Medicine

## 2023-05-03 ENCOUNTER — Other Ambulatory Visit: Payer: Self-pay | Admitting: Acute Care

## 2023-05-03 DIAGNOSIS — Z122 Encounter for screening for malignant neoplasm of respiratory organs: Secondary | ICD-10-CM

## 2023-05-03 DIAGNOSIS — F17210 Nicotine dependence, cigarettes, uncomplicated: Secondary | ICD-10-CM

## 2023-05-03 DIAGNOSIS — Z87891 Personal history of nicotine dependence: Secondary | ICD-10-CM

## 2023-05-12 DIAGNOSIS — H43813 Vitreous degeneration, bilateral: Secondary | ICD-10-CM | POA: Diagnosis not present

## 2023-05-12 DIAGNOSIS — H25813 Combined forms of age-related cataract, bilateral: Secondary | ICD-10-CM | POA: Diagnosis not present

## 2023-05-12 DIAGNOSIS — D3132 Benign neoplasm of left choroid: Secondary | ICD-10-CM | POA: Diagnosis not present

## 2023-05-28 DIAGNOSIS — H40013 Open angle with borderline findings, low risk, bilateral: Secondary | ICD-10-CM | POA: Diagnosis not present

## 2023-05-28 DIAGNOSIS — H25813 Combined forms of age-related cataract, bilateral: Secondary | ICD-10-CM | POA: Diagnosis not present

## 2023-05-28 DIAGNOSIS — H02834 Dermatochalasis of left upper eyelid: Secondary | ICD-10-CM | POA: Diagnosis not present

## 2023-05-28 DIAGNOSIS — H02831 Dermatochalasis of right upper eyelid: Secondary | ICD-10-CM | POA: Diagnosis not present

## 2023-06-02 ENCOUNTER — Other Ambulatory Visit: Payer: Self-pay | Admitting: Family Medicine

## 2023-06-15 ENCOUNTER — Encounter: Payer: Self-pay | Admitting: Family Medicine

## 2023-06-15 ENCOUNTER — Ambulatory Visit (INDEPENDENT_AMBULATORY_CARE_PROVIDER_SITE_OTHER): Payer: Medicare Other | Admitting: Family Medicine

## 2023-06-15 VITALS — BP 138/68 | HR 80 | Temp 97.6°F | Ht 66.0 in | Wt 153.0 lb

## 2023-06-15 DIAGNOSIS — I10 Essential (primary) hypertension: Secondary | ICD-10-CM

## 2023-06-15 DIAGNOSIS — J449 Chronic obstructive pulmonary disease, unspecified: Secondary | ICD-10-CM | POA: Diagnosis not present

## 2023-06-15 DIAGNOSIS — Z23 Encounter for immunization: Secondary | ICD-10-CM | POA: Diagnosis not present

## 2023-06-15 DIAGNOSIS — E785 Hyperlipidemia, unspecified: Secondary | ICD-10-CM | POA: Diagnosis not present

## 2023-06-15 DIAGNOSIS — M5126 Other intervertebral disc displacement, lumbar region: Secondary | ICD-10-CM | POA: Diagnosis not present

## 2023-06-15 DIAGNOSIS — F339 Major depressive disorder, recurrent, unspecified: Secondary | ICD-10-CM

## 2023-06-15 MED ORDER — HYDROCODONE-ACETAMINOPHEN 5-325 MG PO TABS
0.5000 | ORAL_TABLET | Freq: Four times a day (QID) | ORAL | 0 refills | Status: DC | PRN
Start: 2023-08-14 — End: 2023-09-14

## 2023-06-15 MED ORDER — HYDROCODONE-ACETAMINOPHEN 5-325 MG PO TABS
0.5000 | ORAL_TABLET | Freq: Four times a day (QID) | ORAL | 0 refills | Status: DC | PRN
Start: 2023-06-15 — End: 2023-09-14

## 2023-06-15 MED ORDER — DULOXETINE HCL 60 MG PO CPEP: 60.0000 mg | ORAL_CAPSULE | Freq: Two times a day (BID) | ORAL | 3 refills | Status: DC

## 2023-06-15 MED ORDER — HYDROCODONE-ACETAMINOPHEN 5-325 MG PO TABS
0.5000 | ORAL_TABLET | Freq: Four times a day (QID) | ORAL | 0 refills | Status: DC | PRN
Start: 2023-07-15 — End: 2023-09-14

## 2023-06-15 NOTE — Progress Notes (Signed)
Subjective:  Patient ID: Joshua Bowman, male    DOB: 1946-09-14  Age: 77 y.o. MRN: [id number]  CC: Medical Management of Chronic Issues   HPI Joshua Bowman presents for  follow-up of hypertension. Patient has no history of headache chest pain or shortness of breath or recent cough. Patient also denies symptoms of TIA such as focal numbness or weakness. Home readings 128-132 systolic. Off all meds for BP. Also hx of elevated cholesterol. Not taking meds. Due for recheck of lipids.  Chronic pain in back treated with opiate BID. Goes well as long as taking it. Ran out and pain rebounded. Usually 2-3 with med. Went to 6-7/10 when he was stretching the pills before appointment.        06/15/2023    1:27 PM 06/15/2023    1:20 PM 03/12/2023   12:52 PM 03/03/2023    1:37 PM 12/10/2022    9:44 AM  Depression screen PHQ 2/9  Decreased Interest 0 0 0 0 0  Down, Depressed, Hopeless 0 0 0 0 0  PHQ - 2 Score 0 0 0 0 0  Altered sleeping 1      Tired, decreased energy 0      Change in appetite 0      Feeling bad or failure about yourself  0      Trouble concentrating 0      Moving slowly or fidgety/restless 0      Suicidal thoughts 0      PHQ-9 Score 1      Difficult doing work/chores Not difficult at all       Mentally feeling a lot better too.   Seeing Dr. Sherene Sires in 2 weeks for COPD. Some days has a lot of phlegm. Symbicort helps a lot.   History Sipriano has a past medical history of Anxiety, Arthritis, Bilateral carotid artery stenosis, Colon polyps - attenuated polyposis syndrome, COPD (chronic obstructive pulmonary disease) (HCC), Duodenitis, Gastritis, Hemorrhoids, Hyperlipidemia, Hyperplasia of prostate, IBS (irritable bowel syndrome), Pre-diabetes, and Sigmoid diverticulosis.  He has a past surgical history that includes Appendectomy (1969); nasal surgery ( spurs ) (70's); lipoma rt shoulder (01/2001); rt. shoulder -rotator cuff repair (08/2005); Colonoscopy; Esophagogastroduodenoscopy;  Tonsillectomy; and Eye surgery.   His family history includes Arthritis in his sister; Congestive Heart Failure in his father; Heart attack in his brother; Heart disease in his brother and mother; Post-traumatic stress disorder in his father; Prostate cancer in his brother.He reports that he has been smoking cigarettes. He started smoking about 42 years ago. He has a 45 pack-year smoking history. He has quit using smokeless tobacco. He reports that he does not currently use alcohol. He reports that he does not use drugs.  Current Outpatient Medications on File Prior to Visit  Medication Sig Dispense Refill   albuterol (VENTOLIN HFA) 108 (90 Base) MCG/ACT inhaler Inhale 2 puffs into the lungs every 4 (four) hours as needed for wheezing or shortness of breath. 18 g 4   aspirin 325 MG tablet Take 325 mg by mouth every other day.      budesonide-formoterol (SYMBICORT) 160-4.5 MCG/ACT inhaler INHALE TWO PUFFS TWICE DAILY 10.2 g 0   Cholecalciferol (VITAMIN D3) 5000 UNITS CAPS Take 1 tablet by mouth. 2-3 x week     diclofenac sodium (VOLTAREN) 1 % GEL Apply 4 g topically 2 (two) times daily. 100 g 2   tamsulosin (FLOMAX) 0.4 MG CAPS capsule Take 2 capsules (0.8 mg total) by mouth at bedtime. For urine  flow and prostate 180 capsule 3   Current Facility-Administered Medications on File Prior to Visit  Medication Dose Route Frequency Provider Last Rate Last Admin   betamethasone acetate-betamethasone sodium phosphate (CELESTONE) injection 6 mg  6 mg Intramuscular Once Mechele Claude, MD       betamethasone acetate-betamethasone sodium phosphate (CELESTONE) injection 6 mg  6 mg Intramuscular Once Mechele Claude, MD        ROS Review of Systems  Constitutional:  Negative for fever.  Respiratory:  Positive for cough and shortness of breath (intermittent. Relief with Symbicort).   Cardiovascular:  Negative for chest pain.  Musculoskeletal:  Positive for arthralgias and back pain.  Skin:  Negative for  rash.    Objective:  BP 138/68   Pulse 80   Temp 97.6 F (36.4 C)   Ht 5\' 6"  (1.676 m)   Wt 153 lb (69.4 kg)   SpO2 91%   BMI 24.69 kg/m   BP Readings from Last 3 Encounters:  06/15/23 138/68  03/12/23 127/69  12/25/22 128/78    Wt Readings from Last 3 Encounters:  06/15/23 153 lb (69.4 kg)  03/12/23 152 lb 9.6 oz (69.2 kg)  03/03/23 152 lb (68.9 kg)     Physical Exam Vitals reviewed.  Constitutional:      Appearance: He is well-developed.  HENT:     Head: Normocephalic and atraumatic.     Right Ear: External ear normal.     Left Ear: External ear normal.     Mouth/Throat:     Pharynx: No oropharyngeal exudate or posterior oropharyngeal erythema.  Eyes:     Pupils: Pupils are equal, round, and reactive to light.  Cardiovascular:     Rate and Rhythm: Normal rate and regular rhythm.     Heart sounds: No murmur heard. Pulmonary:     Effort: No respiratory distress.     Breath sounds: Normal breath sounds.  Musculoskeletal:     Cervical back: Normal range of motion and neck supple.  Neurological:     Mental Status: He is alert and oriented to person, place, and time.       Assessment & Plan:   Geoffry was seen today for medical management of chronic issues.  Diagnoses and all orders for this visit:  Primary hypertension  Hyperlipidemia, unspecified hyperlipidemia type -     DULoxetine (CYMBALTA) 60 MG capsule; Take 1 capsule (60 mg total) by mouth 2 (two) times daily.  COPD GOLD 3  with CB features -     CMP14+EGFR -     CBC with Differential/Platelet -     Lipid panel  Lumbar herniated disc -     HYDROcodone-acetaminophen (NORCO/VICODIN) 5-325 MG tablet; Take 0.5 tablets by mouth every 6 (six) hours as needed for moderate pain. -     HYDROcodone-acetaminophen (NORCO/VICODIN) 5-325 MG tablet; Take 0.5 tablets by mouth every 6 (six) hours as needed for moderate pain. -     HYDROcodone-acetaminophen (NORCO/VICODIN) 5-325 MG tablet; Take 0.5 tablets by  mouth every 6 (six) hours as needed for moderate pain.  Depression, recurrent (HCC) -     DULoxetine (CYMBALTA) 60 MG capsule; Take 1 capsule (60 mg total) by mouth 2 (two) times daily.   Allergies as of 06/15/2023       Reactions   Lipitor [atorvastatin] Other (See Comments)   myalgias   Sulfa Antibiotics Itching, Rash        Medication List        Accurate as of  June 15, 2023  2:43 PM. If you have any questions, ask your nurse or doctor.          albuterol 108 (90 Base) MCG/ACT inhaler Commonly known as: VENTOLIN HFA Inhale 2 puffs into the lungs every 4 (four) hours as needed for wheezing or shortness of breath.   aspirin 325 MG tablet Take 325 mg by mouth every other day.   budesonide-formoterol 160-4.5 MCG/ACT inhaler Commonly known as: Symbicort INHALE TWO PUFFS TWICE DAILY   diclofenac sodium 1 % Gel Commonly known as: Voltaren Apply 4 g topically 2 (two) times daily.   DULoxetine 60 MG capsule Commonly known as: CYMBALTA Take 1 capsule (60 mg total) by mouth 2 (two) times daily.   HYDROcodone-acetaminophen 5-325 MG tablet Commonly known as: NORCO/VICODIN Take 0.5 tablets by mouth every 6 (six) hours as needed for moderate pain. What changed: Another medication with the same name was changed. Make sure you understand how and when to take each. Changed by: Broadus John Javion Holmer   HYDROcodone-acetaminophen 5-325 MG tablet Commonly known as: NORCO/VICODIN Take 0.5 tablets by mouth every 6 (six) hours as needed for moderate pain. Start taking on: July 15, 2023 What changed: These instructions start on July 15, 2023. If you are unsure what to do until then, ask your doctor or other care provider. Changed by: Broadus John Traniya Prichett   HYDROcodone-acetaminophen 5-325 MG tablet Commonly known as: NORCO/VICODIN Take 0.5 tablets by mouth every 6 (six) hours as needed for moderate pain. Start taking on: August 14, 2023 What changed: These instructions start on  August 14, 2023. If you are unsure what to do until then, ask your doctor or other care provider. Changed by: Broadus John Layann Bluett   tamsulosin 0.4 MG Caps capsule Commonly known as: FLOMAX Take 2 capsules (0.8 mg total) by mouth at bedtime. For urine flow and prostate   Vitamin D3 125 MCG (5000 UT) Caps Take 1 tablet by mouth. 2-3 x week        Meds ordered this encounter  Medications   DULoxetine (CYMBALTA) 60 MG capsule    Sig: Take 1 capsule (60 mg total) by mouth 2 (two) times daily.    Dispense:  180 capsule    Refill:  3   HYDROcodone-acetaminophen (NORCO/VICODIN) 5-325 MG tablet    Sig: Take 0.5 tablets by mouth every 6 (six) hours as needed for moderate pain.    Dispense:  60 tablet    Refill:  0   HYDROcodone-acetaminophen (NORCO/VICODIN) 5-325 MG tablet    Sig: Take 0.5 tablets by mouth every 6 (six) hours as needed for moderate pain.    Dispense:  60 tablet    Refill:  0   HYDROcodone-acetaminophen (NORCO/VICODIN) 5-325 MG tablet    Sig: Take 0.5 tablets by mouth every 6 (six) hours as needed for moderate pain.    Dispense:  60 tablet    Refill:  0      Follow-up: Return in about 3 months (around 09/14/2023).  Mechele Claude, M.D.

## 2023-06-16 LAB — CMP14+EGFR
ALT: 10 IU/L (ref 0–44)
AST: 17 IU/L (ref 0–40)
Albumin: 3.9 g/dL (ref 3.8–4.8)
Alkaline Phosphatase: 63 IU/L (ref 44–121)
BUN/Creatinine Ratio: 15 (ref 10–24)
BUN: 13 mg/dL (ref 8–27)
Bilirubin Total: 0.2 mg/dL (ref 0.0–1.2)
CO2: 25 mmol/L (ref 20–29)
Calcium: 9.3 mg/dL (ref 8.6–10.2)
Chloride: 101 mmol/L (ref 96–106)
Creatinine, Ser: 0.84 mg/dL (ref 0.76–1.27)
Globulin, Total: 1.9 g/dL (ref 1.5–4.5)
Glucose: 94 mg/dL (ref 70–99)
Potassium: 4.7 mmol/L (ref 3.5–5.2)
Sodium: 138 mmol/L (ref 134–144)
Total Protein: 5.8 g/dL — ABNORMAL LOW (ref 6.0–8.5)
eGFR: 90 mL/min/{1.73_m2} (ref >59)

## 2023-06-16 LAB — CBC WITH DIFFERENTIAL/PLATELET
Basophils Absolute: 0.1 10*3/uL (ref 0.0–0.2)
Basos: 1 %
EOS (ABSOLUTE): 0.1 10*3/uL (ref 0.0–0.4)
Eos: 1 %
Hematocrit: 50.6 % (ref 37.5–51.0)
Hemoglobin: 16.8 g/dL (ref 13.0–17.7)
Immature Grans (Abs): 0 10*3/uL (ref 0.0–0.1)
Immature Granulocytes: 0 %
Lymphocytes Absolute: 1.7 10*3/uL (ref 0.7–3.1)
Lymphs: 22 %
MCH: 32.3 pg (ref 26.6–33.0)
MCHC: 33.2 g/dL (ref 31.5–35.7)
MCV: 97 fL (ref 79–97)
Monocytes Absolute: 0.8 10*3/uL (ref 0.1–0.9)
Monocytes: 10 %
Neutrophils Absolute: 5.2 10*3/uL (ref 1.4–7.0)
Neutrophils: 66 %
Platelets: 234 10*3/uL (ref 150–450)
RBC: 5.2 x10E6/uL (ref 4.14–5.80)
RDW: 12.9 % (ref 11.6–15.4)
WBC: 7.8 10*3/uL (ref 3.4–10.8)

## 2023-06-16 LAB — LIPID PANEL
Chol/HDL Ratio: 2.7 ratio (ref 0.0–5.0)
Cholesterol, Total: 175 mg/dL (ref 100–199)
HDL: 64 mg/dL (ref >39)
LDL Chol Calc (NIH): 98 mg/dL (ref 0–99)
Triglycerides: 66 mg/dL (ref 0–149)
VLDL Cholesterol Cal: 13 mg/dL (ref 5–40)

## 2023-06-18 NOTE — Progress Notes (Signed)
Hello Rosco,  Your lab result is normal and/or stable.Some minor variations that are not significant are commonly marked abnormal, but do not represent any medical problem for you.  Best regards, Mechele Claude, M.D.

## 2023-06-26 ENCOUNTER — Encounter (HOSPITAL_COMMUNITY): Payer: Self-pay

## 2023-06-26 ENCOUNTER — Encounter (HOSPITAL_COMMUNITY)
Admission: RE | Admit: 2023-06-26 | Discharge: 2023-06-26 | Disposition: A | Discharge status: Home / Self Care | Payer: Medicare Other | Source: Ambulatory Visit | Attending: Ophthalmology | Admitting: Ophthalmology

## 2023-06-29 DIAGNOSIS — H25811 Combined forms of age-related cataract, right eye: Secondary | ICD-10-CM | POA: Diagnosis not present

## 2023-07-01 NOTE — H&P (Signed)
Surgical History & Physical  Patient Name: Joshua Bowman  DOB: 1946/05/08  Surgery: Cataract extraction with intraocular lens implant phacoemulsification; Right Eye Surgeon: Fabio Pierce MD Surgery Date: 07/03/2023 Pre-Op Date: 05/28/2023  HPI: A 70 Yr. old male patient 1. The patient was referred to [place] from Dr. Laural Benes for a cataract evaluation. The patient's vision is blurry. The condition's severity is constant. The complaint is associated with difficulty reading small print on medicine bottles/labels, difficulty with glare on bright sunny days, and difficulty reading traffic/street signs. This is negatively affecting the patient's quality of life and the patient is unable to function adequately in life with the current level of vision. HPI Completed by Dr. Fabio Pierce  Medical History: Cataracts PVD OU  Choroidal nevus OS  COPD  Review of Systems Negative Allergic/Immunologic Negative Cardiovascular Negative Constitutional Negative Ear, Nose, Mouth & Throat Negative Endocrine Negative Eyes Negative Gastrointestinal Negative Genitourinary Negative Hemotologic/Lymphatic Negative Integumentary Negative Musculoskeletal Negative Neurological Negative Psychiatry COPD Respiratory  Social Smoker, current status unknown   Medication hydrocodone-acetaminophen ,  tamsulosin ,  Symbicort ,  duloxetine   Sx/Procedures Vitreous laser ?,  Rotator Cuff  Drug Allergies  sulfa   History & Physical: Heent: cataract NECK: supple without bruits LUNGS: lungs clear to auscultation CV: regular rate and rhythm Abdomen: soft and non-tender  Impression & Plan: Assessment: 1.  COMBINED FORMS AGE RELATED CATARACT; Both Eyes (H25.813) 2.  Myopia ; Both Eyes (H52.13) 3.  OAG BORDERLINE FINDINGS LOW RISK; Both Eyes (H40.013) 4.  DERMATOCHALASIS, no surgery; Right Upper Lid, Left Upper Lid (H02.831, H02.834) 5.  BLEPHARITIS; Right Upper Lid, Right Lower Lid, Left Upper Lid, Left Lower  Lid (H01.001, H01.002,H01.004,H01.005) 6.  Pinguecula; Both Eyes (H11.153)  Plan: 1.  Cataract accounts for the patient's decreased vision. This visual impairment is not correctable with a tolerable change in glasses or contact lenses. Cataract surgery with an implantation of a new lens should significantly improve the visual and functional status of the patient. Discussed all risks, benefits, alternatives, and potential complications. Discussed the procedures and recovery. Patient desires to have surgery. A-scan ordered and performed today for intra-ocular lens calculations. The surgery will be performed in order to improve vision for driving, reading, and for eye examinations. Recommend phacoemulsification with intra-ocular lens. Recommend Dextenza for post-operative pain and inflammation. Right Eye worse - first. Dilates poorly - shugarcaine by protocol. Malyugin Ring. Omidira. Patient wishes to keep near vision without correction, set near goal -2.50 OU.  2.  as above.  3.  Based on cup-to-disc ratio. Negative Family history. OCT rNFL shows: WNL OU 05/28/23 IOPs WNL OU. Detailed discussion about glaucoma today including importance of maintaining good follow up and following treatment plan, and the possibility of irreversible blindness as part of this disease process.  4.  Asymptomatic, recommend observation for now. Findings, prognosis and treatment options reviewed.  5.  Blepharitis is present - recommend regular lid cleaning.  6.  Observe; Artificial tears as needed for irritation.

## 2023-07-03 ENCOUNTER — Ambulatory Visit (HOSPITAL_COMMUNITY)
Admission: RE | Admit: 2023-07-03 | Discharge: 2023-07-03 | Disposition: A | Discharge status: Home / Self Care | Payer: Medicare Other | Attending: Ophthalmology | Admitting: Ophthalmology

## 2023-07-03 ENCOUNTER — Ambulatory Visit (HOSPITAL_COMMUNITY): Payer: Medicare Other | Admitting: Certified Registered"

## 2023-07-03 ENCOUNTER — Ambulatory Visit (HOSPITAL_BASED_OUTPATIENT_CLINIC_OR_DEPARTMENT_OTHER): Payer: Medicare Other | Admitting: Certified Registered"

## 2023-07-03 ENCOUNTER — Encounter (HOSPITAL_COMMUNITY): Payer: Self-pay | Admitting: Ophthalmology

## 2023-07-03 ENCOUNTER — Encounter (HOSPITAL_COMMUNITY)
Admission: RE | Disposition: A | Discharge status: Home / Self Care | Payer: Self-pay | Source: Home / Self Care | Attending: Ophthalmology

## 2023-07-03 DIAGNOSIS — J449 Chronic obstructive pulmonary disease, unspecified: Secondary | ICD-10-CM | POA: Diagnosis not present

## 2023-07-03 DIAGNOSIS — H2181 Floppy iris syndrome: Secondary | ICD-10-CM | POA: Diagnosis not present

## 2023-07-03 DIAGNOSIS — F17210 Nicotine dependence, cigarettes, uncomplicated: Secondary | ICD-10-CM | POA: Insufficient documentation

## 2023-07-03 DIAGNOSIS — I10 Essential (primary) hypertension: Secondary | ICD-10-CM | POA: Insufficient documentation

## 2023-07-03 DIAGNOSIS — I2510 Atherosclerotic heart disease of native coronary artery without angina pectoris: Secondary | ICD-10-CM | POA: Diagnosis not present

## 2023-07-03 DIAGNOSIS — H25811 Combined forms of age-related cataract, right eye: Secondary | ICD-10-CM | POA: Insufficient documentation

## 2023-07-03 LAB — GLUCOSE, CAPILLARY: Glucose-Capillary: 91 mg/dL (ref 70–99)

## 2023-07-03 SURGERY — CATARACT EXTRACTION PHACO AND INTRAOCULAR LENS PLACEMENT (IOC) with placement of Corticosteroid
Anesthesia: Monitor Anesthesia Care | Laterality: Right

## 2023-07-03 MED ORDER — PHENYLEPHRINE-KETOROLAC 1-0.3 % IO SOLN
INTRAOCULAR | Status: DC | PRN
  Administered 2023-07-03: 500 mL via OPHTHALMIC

## 2023-07-03 MED ORDER — BSS IO SOLN
INTRAOCULAR | Status: DC | PRN
  Administered 2023-07-03: 15 mL via INTRAOCULAR

## 2023-07-03 MED ORDER — SODIUM HYALURONATE 23MG/ML IO SOSY
PREFILLED_SYRINGE | INTRAOCULAR | Status: DC | PRN
  Administered 2023-07-03: .6 mL via INTRAOCULAR

## 2023-07-03 MED ORDER — SODIUM HYALURONATE 10 MG/ML IO SOLUTION
PREFILLED_SYRINGE | INTRAOCULAR | Status: DC | PRN
  Administered 2023-07-03: .85 mL via INTRAOCULAR

## 2023-07-03 MED ORDER — MIDAZOLAM HCL 2 MG/2ML IJ SOLN
INTRAMUSCULAR | Status: DC | PRN
  Administered 2023-07-03 (×2): 1 mg via INTRAVENOUS

## 2023-07-03 MED ORDER — SODIUM CHLORIDE 0.9% FLUSH
INTRAVENOUS | Status: DC | PRN
  Administered 2023-07-03 (×2): 5 mL via INTRAVENOUS

## 2023-07-03 MED ORDER — POVIDONE-IODINE 5 % OP SOLN
OPHTHALMIC | Status: DC | PRN
  Administered 2023-07-03: 1 via OPHTHALMIC

## 2023-07-03 MED ORDER — PHENYLEPHRINE HCL 2.5 % OP SOLN
1.0000 [drp] | OPHTHALMIC | Status: AC | PRN
  Administered 2023-07-03 (×3): 1 [drp] via OPHTHALMIC

## 2023-07-03 MED ORDER — MOXIFLOXACIN HCL 5 MG/ML IO SOLN
INTRAOCULAR | Status: DC | PRN
  Administered 2023-07-03: .3 mL via OPHTHALMIC

## 2023-07-03 MED ORDER — TETRACAINE HCL 0.5 % OP SOLN
1.0000 [drp] | OPHTHALMIC | Status: AC | PRN
  Administered 2023-07-03 (×3): 1 [drp] via OPHTHALMIC

## 2023-07-03 MED ORDER — LIDOCAINE HCL 3.5 % OP GEL
1.0000 | Freq: Once | OPHTHALMIC | Status: AC
  Administered 2023-07-03: 1 via OPHTHALMIC

## 2023-07-03 MED ORDER — TROPICAMIDE 1 % OP SOLN
1.0000 [drp] | OPHTHALMIC | Status: AC | PRN
  Administered 2023-07-03 (×3): 1 [drp] via OPHTHALMIC

## 2023-07-03 MED ORDER — DEXAMETHASONE 0.4 MG OP INST
VAGINAL_INSERT | OPHTHALMIC | Status: AC
  Filled 2023-07-03: qty 1

## 2023-07-03 MED ORDER — STERILE WATER FOR IRRIGATION IR SOLN
Status: DC | PRN
  Administered 2023-07-03: 250 mL

## 2023-07-03 MED ORDER — DEXAMETHASONE 0.4 MG OP INST: VAGINAL_INSERT | OPHTHALMIC | Status: DC | PRN

## 2023-07-03 MED ORDER — MIDAZOLAM HCL 2 MG/2ML IJ SOLN
INTRAMUSCULAR | Status: AC
  Filled 2023-07-03: qty 2

## 2023-07-03 MED ORDER — LIDOCAINE HCL (PF) 1 % IJ SOLN
INTRAOCULAR | Status: DC | PRN
  Administered 2023-07-03: 1 mL via OPHTHALMIC

## 2023-07-03 SURGICAL SUPPLY — 14 items
CATARACT SUITE SIGHTPATH (MISCELLANEOUS) ×1
CLOTH BEACON ORANGE TIMEOUT ST (SAFETY) ×1 IMPLANT
EYE SHIELD UNIVERSAL CLEAR (GAUZE/BANDAGES/DRESSINGS) IMPLANT
FEE CATARACT SUITE SIGHTPATH (MISCELLANEOUS) ×1 IMPLANT
GLOVE BIOGEL PI IND STRL 7.0 (GLOVE) ×2 IMPLANT
LENS IOL TECNIS EYHANCE 22.0 (Intraocular Lens) IMPLANT
NDL HYPO 18GX1.5 BLUNT FILL (NEEDLE) ×1 IMPLANT
NEEDLE HYPO 18GX1.5 BLUNT FILL (NEEDLE) ×1
PAD ARMBOARD 7.5X6 YLW CONV (MISCELLANEOUS) ×1 IMPLANT
POSITIONER HEAD 8X9X4 ADT (SOFTGOODS) ×1 IMPLANT
RING MALYGIN 7.0 (MISCELLANEOUS) IMPLANT
SYR TB 1ML LL NO SAFETY (SYRINGE) ×1 IMPLANT
TAPE SURG TRANSPORE 1 IN (GAUZE/BANDAGES/DRESSINGS) IMPLANT
WATER STERILE IRR 250ML POUR (IV SOLUTION) ×1 IMPLANT

## 2023-07-03 NOTE — Anesthesia Procedure Notes (Signed)
Procedure Name: MAC Date/Time: 07/03/2023 9:42 AM  Performed by: Julian Reil, CRNAPre-anesthesia Checklist: Patient identified, Emergency Drugs available, Suction available and Patient being monitored Patient Re-evaluated:Patient Re-evaluated prior to induction Oxygen Delivery Method: Nasal cannula Placement Confirmation: positive ETCO2

## 2023-07-03 NOTE — Transfer of Care (Signed)
Immediate Anesthesia Transfer of Care Note  Patient: Joshua Bowman  Procedure(s) Performed: CATARACT EXTRACTION PHACO AND INTRAOCULAR LENS PLACEMENT (IOC) (Right)  Patient Location: Short Stay  Anesthesia Type:MAC  Level of Consciousness: awake, alert , and oriented  Airway & Oxygen Therapy: Patient Spontanous Breathing  Post-op Assessment: Report given to RN and Post -op Vital signs reviewed and stable  Post vital signs: Reviewed and stable  Last Vitals:  Vitals Value Taken Time  BP    Temp    Pulse    Resp    SpO2      Last Pain:  Vitals:   07/03/23 0859  PainSc: 0-No pain         Complications: No notable events documented.

## 2023-07-03 NOTE — Discharge Instructions (Signed)
Please discharge patient when stable, will follow up today with Dr. Carnella Fryman at the [facility] [place] office immediately following discharge.  Leave shield in place until visit.  All paperwork with discharge instructions will be given at the office.  [facility] Address:  [address]  [place], [place] [address]  

## 2023-07-03 NOTE — Anesthesia Postprocedure Evaluation (Signed)
Anesthesia Post Note  Patient: Joshua Bowman  Procedure(s) Performed: CATARACT EXTRACTION PHACO AND INTRAOCULAR LENS PLACEMENT (IOC) (Right)  Patient location during evaluation: PACU Anesthesia Type: MAC Level of consciousness: awake and alert Pain management: pain level controlled Vital Signs Assessment: post-procedure vital signs reviewed and stable Respiratory status: spontaneous breathing, nonlabored ventilation, respiratory function stable and patient connected to nasal cannula oxygen Cardiovascular status: stable and blood pressure returned to baseline Postop Assessment: no apparent nausea or vomiting Anesthetic complications: no   There were no known notable events for this encounter.   Last Vitals:  Vitals:   07/03/23 0902 07/03/23 1006  BP: 123/70 111/71  Pulse: 87 86  Resp: 14 19  Temp: 36.7 C 36.7 C  SpO2: 100% 98%    Last Pain:  Vitals:   07/03/23 1006  PainSc: 0-No pain                 Cosima Prentiss L Treon Kehl

## 2023-07-03 NOTE — Interval H&P Note (Signed)
History and Physical Interval Note:  07/03/2023 9:36 AM  Joshua Bowman  has presented today for surgery, with the diagnosis of combined forms age related cataract, right eye.  The various methods of treatment have been discussed with the patient and family. After consideration of risks, benefits and other options for treatment, the patient has consented to  Procedure(s) with comments: CATARACT EXTRACTION PHACO AND INTRAOCULAR LENS PLACEMENT (IOC) with placement of Corticosteroid (Right) - pt knows to arrive at 8:00 as a surgical intervention.  The patient's history has been reviewed, patient examined, no change in status, stable for surgery.  I have reviewed the patient's chart and labs.  Questions were answered to the patient's satisfaction.     Fabio Pierce

## 2023-07-03 NOTE — Anesthesia Preprocedure Evaluation (Addendum)
Anesthesia Evaluation  Patient identified by MRN, date of birth, ID band Patient awake    Reviewed: Allergy & Precautions, H&P , NPO status , Patient's Chart, lab work & pertinent test results, reviewed documented beta blocker date and time   Airway Mallampati: II  TM Distance: >3 FB Neck ROM: full    Dental no notable dental hx. (+) Dental Advisory Given,  Several missing teeth:   Pulmonary COPD, Current Smoker   Pulmonary exam normal breath sounds clear to auscultation       Cardiovascular Exercise Tolerance: Good hypertension, + CAD  negative cardio ROS Normal cardiovascular exam Rhythm:regular Rate:Normal     Neuro/Psych  PSYCHIATRIC DISORDERS Anxiety Depression    negative neurological ROS  negative psych ROS   GI/Hepatic negative GI ROS, Neg liver ROS,,,  Endo/Other  negative endocrine ROS  Pre diabetes  Renal/GU negative Renal ROS  negative genitourinary   Musculoskeletal  (+) Arthritis , Osteoarthritis,    Abdominal   Peds  Hematology negative hematology ROS (+)   Anesthesia Other Findings   Reproductive/Obstetrics negative OB ROS                             Anesthesia Physical Anesthesia Plan  ASA: 2  Anesthesia Plan: MAC   Post-op Pain Management: Minimal or no pain anticipated   Induction: Intravenous  PONV Risk Score and Plan:   Airway Management Planned: Nasal Cannula and Natural Airway  Additional Equipment:   Intra-op Plan:   Post-operative Plan:   Informed Consent: I have reviewed the patients History and Physical, chart, labs and discussed the procedure including the risks, benefits and alternatives for the proposed anesthesia with the patient or authorized representative who has indicated his/her understanding and acceptance.     Dental Advisory Given  Plan Discussed with: CRNA  Anesthesia Plan Comments:        Anesthesia Quick Evaluation

## 2023-07-03 NOTE — Op Note (Signed)
Date of procedure: 07/03/23  Pre-operative diagnosis: Visually significant combined-form age-related cataract, Right Eye; Poor Dilation, Right Eye (H25.811)  Post-operative diagnosis: Visually significant combined age-related cataract, Right Eye; Intra-operative Floppy Iris Syndrome, Right Eye (H21.81)  Procedure: Removal of cataract via phacoemulsification and insertion of intra-ocular lens [employer] DIB00 +22.0D into the capsular bag of the Right Eye (CPT [id number])  Attending surgeon: Rudy Jew. Laydon Martis, MD, MA  Anesthesia: MAC, Topical Akten  Complications: None  Estimated Blood Loss: <32mL (minimal)  Specimens: None  Implants: As above  Indications:  Visually significant cataract, Right Eye  Procedure:  The patient was seen and identified in the pre-operative area. The operative eye was identified and dilated.  The operative eye was marked.  Topical anesthesia was administered to the operative eye.     The patient was then to the operative suite and placed in the supine position.  A timeout was performed confirming the patient, procedure to be performed, and all other relevant information.   The patient's face was prepped and draped in the usual fashion for intra-ocular surgery.  A lid speculum was placed into the operative eye and the surgical microscope moved into place and focused.  Poor dilation of the iris was confirmed.  A superotemporal paracentesis was created using a 20 gauge paracentesis blade.  Shugarcaine was injected into the anterior chamber.  Viscoelastic was injected into the anterior chamber.  A temporal clear-corneal main wound incision was created using a 2.37mm microkeratome.  A Malyugin ring was placed.  A continuous curvilinear capsulorrhexis was initiated using an irrigating cystitome and completed using capsulorrhexis forceps.  Hydrodissection and hydrodeliniation were performed.  Viscoelastic was injected into the anterior chamber.  A phacoemulsification  handpiece and a chopper as a second instrument were used to remove the nucleus and epinucleus. The irrigation/aspiration handpiece was used to remove any remaining cortical material.   The capsular bag was reinflated with viscoelastic, checked, and found to be intact.  The intraocular lens was inserted into the capsular bag and dialed into place using a Customer service manager.  The Malyugin ring was removed.  The irrigation/aspiration handpiece was used to remove any remaining viscoelastic.  The clear corneal wound and paracentesis wounds were then hydrated and checked with Weck-Cels to be watertight. 0.82mL of moxifloxacin was injected into the anterior chamber.  The lid-speculum and drape was removed, and the patient's face was cleaned with a wet and dry 4x4. A clear shield was taped over the eye. The patient was taken to the post-operative care unit in good condition, having tolerated the procedure well.  Post-Op Instructions: The patient will follow up at [facility] for a same day post-operative evaluation and will receive all other orders and instructions.

## 2023-07-07 DIAGNOSIS — Z720 Tobacco use: Secondary | ICD-10-CM | POA: Insufficient documentation

## 2023-07-07 NOTE — Progress Notes (Unsigned)
Cardiology Office Note   Date:  07/08/2023   ID:  Joshua Bowman, Joshua Bowman 11/28/1945, MRN [id number]  PCP:  Mechele Claude, MD  Cardiologist:   None Referring:  Mechele Claude, MD  No chief complaint on file.     History of Present Illness: Joshua Bowman is a 77 y.o. male I saw him in 2016 for evaluation of elevated coronary calcium.  He was to get a treadmill test but not sure that this never happened.  He was sent back because he was noted to have carotid plaque when he had a Panorex apparently.  He has not had any other cardiovascular testing.  He has not had carotid Dopplers in over 12 years.  He does have risk factors to include longstanding cigarette smoking.  He has COPD.  In fact shortness of breath is his limiting symptom.  He gets short of breath with mild to moderate activity.  He is not describing PND or orthopnea.  He is not having chest pressure, neck or arm discomfort.  He has had no weight gain or edema.  He does still walk behind a self-propelled lawnmower.   Past Medical History:  Diagnosis Date   Anxiety    Arthritis    Bilateral carotid artery stenosis    Minimal 2012    Colon polyps - attenuated polyposis syndrome    multiple    COPD (chronic obstructive pulmonary disease) (HCC)    Duodenitis    Gastritis    Hemorrhoids    Hyperlipidemia    Hyperplasia of prostate    IBS (irritable bowel syndrome)    Pre-diabetes    Sigmoid diverticulosis    mild     Past Surgical History:  Procedure Laterality Date   APPENDECTOMY  1969   COLONOSCOPY     ESOPHAGOGASTRODUODENOSCOPY     EYE SURGERY     laser - left eye - Dr Ashley Royalty   lipoma rt shoulder  01/2001   outpatient    nasal surgery ( spurs )  70's   rt. shoulder -rotator cuff repair  08/2005   Dr. Jillyn Hidden    TONSILLECTOMY       Current Outpatient Medications  Medication Sig Dispense Refill   albuterol (VENTOLIN HFA) 108 (90 Base) MCG/ACT inhaler Inhale 2 puffs into the lungs every 4 (four) hours as  needed for wheezing or shortness of breath. 18 g 4   aspirin 325 MG tablet Take 325 mg by mouth every other day.      budesonide-formoterol (SYMBICORT) 160-4.5 MCG/ACT inhaler INHALE TWO PUFFS TWICE DAILY 10.2 g 4   Cholecalciferol (VITAMIN D3) 5000 UNITS CAPS Take 1 tablet by mouth. 2-3 x week     diclofenac sodium (VOLTAREN) 1 % GEL Apply 4 g topically 2 (two) times daily. 100 g 2   DULoxetine (CYMBALTA) 60 MG capsule Take 1 capsule (60 mg total) by mouth 2 (two) times daily. 180 capsule 3   HYDROcodone-acetaminophen (NORCO/VICODIN) 5-325 MG tablet Take 0.5 tablets by mouth every 6 (six) hours as needed for moderate pain. 60 tablet 0   rosuvastatin (CRESTOR) 10 MG tablet Take 1 tablet (10 mg total) by mouth daily. 90 tablet 3   tamsulosin (FLOMAX) 0.4 MG CAPS capsule Take 2 capsules (0.8 mg total) by mouth at bedtime. For urine flow and prostate 180 capsule 3   [START ON 07/15/2023] HYDROcodone-acetaminophen (NORCO/VICODIN) 5-325 MG tablet Take 0.5 tablets by mouth every 6 (six) hours as needed for moderate pain. 60 tablet 0   [  START ON 08/14/2023] HYDROcodone-acetaminophen (NORCO/VICODIN) 5-325 MG tablet Take 0.5 tablets by mouth every 6 (six) hours as needed for moderate pain. 60 tablet 0   Current Facility-Administered Medications  Medication Dose Route Frequency Provider Last Rate Last Admin   betamethasone acetate-betamethasone sodium phosphate (CELESTONE) injection 6 mg  6 mg Intramuscular Once Mechele Claude, MD       betamethasone acetate-betamethasone sodium phosphate (CELESTONE) injection 6 mg  6 mg Intramuscular Once Mechele Claude, MD        Allergies:   Lipitor [atorvastatin] and Sulfa antibiotics    Social History:  The patient  reports that he has been smoking cigarettes. He started smoking about 42 years ago. He has a 45.1 pack-year smoking history. He has quit using smokeless tobacco. He reports that he does not currently use alcohol. He reports that he does not use drugs.    Family History:  The patient's family history includes Arthritis in his sister; Congestive Heart Failure in his father; Heart attack in his brother; Heart disease in his brother and mother; Post-traumatic stress disorder in his father; Prostate cancer in his brother.    ROS:  Please see the history of present illness.   Otherwise, review of systems are positive for none.   All other systems are reviewed and negative.    PHYSICAL EXAM: VS:  BP 112/68   Pulse 87   Ht 5\' 6"  (1.676 m)   Wt 151 lb (68.5 kg)   BMI 24.37 kg/m  , BMI Body mass index is 24.37 kg/m. GENERAL:  Well appearing HEENT:  Pupils equal round and reactive, fundi not visualized, oral mucosa unremarkable NECK:  No jugular venous distention, waveform within normal limits, carotid upstroke brisk and symmetric, no bruits, no thyromegaly LYMPHATICS:  No cervical, inguinal adenopathy LUNGS: Decreased breath sounds with diffuse expiratory wheezes BACK:  No CVA tenderness CHEST:  Unremarkable HEART:  PMI not displaced or sustained,S1 and S2 within normal limits, no S3, no S4, no clicks, no rubs, no murmurs ABD:  Flat, positive bowel sounds normal in frequency in pitch, no bruits, no rebound, no guarding, no midline pulsatile mass, no hepatomegaly, no splenomegaly EXT:  2 plus pulses throughout, no edema, no cyanosis no clubbing SKIN:  No rashes no nodules NEURO:  Cranial nerves II through XII grossly intact, motor grossly intact throughout [facility]:  Cognitively intact, oriented to person place and time    EKG:  EKG Interpretation Date/Time:  Wednesday July 08 2023 15:04:03 EDT Ventricular Rate:  87 PR Interval:  184 QRS Duration:  128 QT Interval:  394 QTC Calculation: 474 R Axis:   98  Text Interpretation: Normal sinus rhythm Rightward axis Non-specific intra-ventricular conduction block When compared with ECG of 08-Jul-2023 15:03, No significant change was found Confirmed by Rollene Rotunda ([id number]) on 07/08/2023  3:38:52 PM     Recent Labs: 06/15/2023: ALT 10; BUN 13; Creatinine, Ser 0.84; Hemoglobin 16.8; Platelets 234; Potassium 4.7; Sodium 138    Lipid Panel    Component Value Date/Time   CHOL 175 06/15/2023 1355   CHOL 102 03/30/2013 1130   TRIG 66 06/15/2023 1355   TRIG 60 03/16/2017 1040   TRIG 53 03/30/2013 1130   HDL 64 06/15/2023 1355   HDL 56 03/16/2017 1040   HDL 44 03/30/2013 1130   CHOLHDL 2.7 06/15/2023 1355   LDLCALC 98 06/15/2023 1355   LDLCALC 56 04/06/2014 1134   LDLCALC 47 03/30/2013 1130      Wt Readings from Last 3 Encounters:  07/08/23 151 lb (68.5 kg)  07/03/23 153 lb (69.4 kg)  06/15/23 153 lb (69.4 kg)      Other studies Reviewed: Additional studies/ records that were reviewed today include: Labs. Review of the above records demonstrates:  Please see elsewhere in the note.     ASSESSMENT AND PLAN:  CORONARY CALCIUM:     He was to have a POET (Plain Old Exercise Treadmill).    Given the shortness of breath, although this is likely related to his smoking, he needs to be screened for obstructive coronary disease.  He would not be able to walk on a treadmill.  I will order a Lexiscan Myoview.   TOBACCO ABUSE:   We talked about the need to stop smoking.  In the past he did not want to use Chantix.  DYSLIPIDEMIA:  LDL was 98.  He needs to be back on a statin and I will start Crestor 10 mg daily.  CAROTID STENOSIS: He has a bruit and had some plaque noted as above.  He needs carotid Dopplers.  AAA SCREENING: Is a 77 year old male smoker he needs to be screened for abdominal aneurysm.  Current medicines are reviewed at length with the patient today.  The patient does not have concerns regarding medicines.  The following changes have been made:  no change  Labs/ tests ordered today include:   Orders Placed This Encounter  Procedures   NM Myocar Multi W/Spect W/Wall Motion / EF   US Carotid Bilateral   US AORTA MEDICARE SCREENING   Lipid panel   EKG  12-Lead   EKG 12-Lead     Disposition:   FU with me as needed and based on the results of the above.     Signed, Rollene Rotunda, MD  07/08/2023 3:58 PM    [facility]

## 2023-07-08 ENCOUNTER — Encounter: Payer: Self-pay | Admitting: Cardiology

## 2023-07-08 ENCOUNTER — Other Ambulatory Visit: Payer: Self-pay | Admitting: *Deleted

## 2023-07-08 ENCOUNTER — Other Ambulatory Visit: Payer: Self-pay | Admitting: Family Medicine

## 2023-07-08 ENCOUNTER — Ambulatory Visit: Payer: Medicare Other | Admitting: Cardiology

## 2023-07-08 VITALS — BP 112/68 | HR 87 | Ht 66.0 in | Wt 151.0 lb

## 2023-07-08 DIAGNOSIS — R0602 Shortness of breath: Secondary | ICD-10-CM

## 2023-07-08 DIAGNOSIS — E785 Hyperlipidemia, unspecified: Secondary | ICD-10-CM

## 2023-07-08 DIAGNOSIS — Z79899 Other long term (current) drug therapy: Secondary | ICD-10-CM

## 2023-07-08 DIAGNOSIS — I6529 Occlusion and stenosis of unspecified carotid artery: Secondary | ICD-10-CM

## 2023-07-08 DIAGNOSIS — Z720 Tobacco use: Secondary | ICD-10-CM | POA: Diagnosis not present

## 2023-07-08 DIAGNOSIS — R931 Abnormal findings on diagnostic imaging of heart and coronary circulation: Secondary | ICD-10-CM

## 2023-07-08 MED ORDER — ROSUVASTATIN CALCIUM 10 MG PO TABS: 10.0000 mg | ORAL_TABLET | Freq: Every day | ORAL | 3 refills | Status: DC

## 2023-07-08 NOTE — Patient Instructions (Signed)
Medication Instructions:  Please start Crestor 10 mg once a day. Continue all other medications as listed.  *If you need a refill on your cardiac medications before your next appointment, please call your pharmacy*   Lab Work: Please have blood work at your closest [facility] in 3 months. (Lipid)  If you have labs (blood work) drawn today and your tests are completely normal, you will receive your results only by: MyChart Message (if you have MyChart) OR A paper copy in the mail If you have any lab test that is abnormal or we need to change your treatment, we will call you to review the results.   Testing/Procedures: Your physician has requested that you have a lexiscan myoview. For further information please visit [email or web address] Please follow instruction sheet, as given.  Your physician has requested that you have a carotid duplex. This test is an ultrasound of the carotid arteries in your neck. It looks at blood flow through these arteries that supply the brain with blood. Allow one hour for this exam. There are no restrictions or special instructions.  Your physician has requested that you have an abdominal aorta duplex. During this test, an ultrasound is used to evaluate the aorta. Allow 30 minutes for this exam. Do not eat after midnight the day before and avoid carbonated beverages  The above tests will be completed at [facility].  You will be contacted to be scheduled.   Follow-Up: At [facility], you and your health needs are our priority.  As part of our continuing mission to provide you with exceptional heart care, we have created designated Provider Care Teams.  These Care Teams include your primary Cardiologist (physician) and Advanced Practice Providers (APPs -  Physician Assistants and Nurse Practitioners) who all work together to provide you with the care you need, when you need it.  We recommend signing up for the patient portal called "MyChart".  Sign up  information is provided on this After Visit Summary.  MyChart is used to connect with patients for Virtual Visits (Telemedicine).  Patients are able to view lab/test results, encounter notes, upcoming appointments, etc.  Non-urgent messages can be sent to your provider as well.   To learn more about what you can do with MyChart, go to [email or web address].    Your next appointment:   Follow up will be based on the results of the above testing.

## 2023-07-09 ENCOUNTER — Telehealth: Payer: Self-pay | Admitting: Cardiology

## 2023-07-09 NOTE — Telephone Encounter (Signed)
Checking percert on the following patient for testing scheduled at [facility].   LEXISCAN  07/30/2023

## 2023-07-19 NOTE — Progress Notes (Unsigned)
Joshua Bowman, male    DOB: 1946/02/17    MRN: [id number]   Brief patient profile:  29  yowm  active smoker  referred to pulmonary clinic in [place]  11/11/2022 by Mechele Claude for LDSCT showing ? 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 with CB symptoms worse x 2022   History of Present Illness  11/11/2022  Pulmonary/ 1st office eval/ Joshua Bowman / [place] [facility]  Chief Complaint  Patient presents with   New Patient (Initial Visit)    Saw SG NP for LCS    Dyspnea:  [employer] walks slower than avg pushes cart / no hc parking Cough: thick white, never bloody p coffee seems worse / rattles at bedtime  Sleep: does not disturb sleep  SABA use: 2-3 x per week  02: none Rec Prednisone 10 mg take  4 each am x 2 days,  2 each am x 2 days,  1 each am x 2 days and stop  Continue Symbicort 160  but you must work on inhaler technique:   For cough > mucinex 1200 mg twice daily with a glass of water and use the flutter valve as much as your can  The key is to stop smoking completely before smoking completely stops you!  Please remember to go to the lab department   for your tests - we will call you with the results when they are available.     Please remember to go to the  x-ray department  @  [facility] for your tests - we will call you with the results when they are available     Please schedule a follow up office visit in 6 weeks, call sooner if needed with all medications /inhalers/ solutions in hand         12/25/2022  f/u ov/[place] office/Joshua Bowman re: AB maint on symbicort 160 2bid  did not bring meds none / pfts for Feb 03 2023  Chief Complaint  Patient presents with   Follow-up    Breathing is better since using flutter valve  Dyspnea:  no change = MMRC2 = can't walk a nl pace on a flat grade s sob but does fine slow and flat  Cough: horn sound  Sleeping: bed is flat/ lies on side mostly does fine  SABA use: maybe once a day  02: none Rec No change in medications Work on  inhaler technique:   If any worse with allergy season > Prednisone 10 mg take  4 each am x 2 days,   2 each am x 2 days,  1 each am x 2 days and stop  The key is to stop smoking completely before smoking completely stops you!   Please schedule a follow up visit in 6 months but call sooner if needed with inhalers in hand     07/20/2023  f/u ov/[place] office/Joshua Bowman re: GOLD 3 copd/cb  maint on symbicort   Chief Complaint  Patient presents with   COPD   Dyspnea:  MMRC2 = can't walk a nl pace on a flat grade s sob but does fine slow and flat walking fast or uphills  Cough: comes and goes > min mucoid  Sleeping: bed is flat one side / one pillow s    resp cc  SABA use: avg 3 x weekly  02: none   Lung cancer screening: due 11/2023   No obvious day to day or daytime variability or assoc excess/ purulent sputum or mucus plugs or hemoptysis  or cp or chest tightness, subjective wheeze or overt sinus or hb symptoms.    Also denies any obvious fluctuation of symptoms with weather or environmental changes or other aggravating or alleviating factors except as outlined above   No unusual exposure hx or h/o childhood pna/ asthma or knowledge of premature birth.  Current Allergies, Complete Past Medical History, Past Surgical History, Family History, and Social History were reviewed in [employer] record.  ROS  The following are not active complaints unless bolded Hoarseness, sore throat, dysphagia, dental problems, itching, sneezing,  nasal congestion or discharge of excess mucus or purulent secretions, ear ache,   fever, chills, sweats, unintended wt loss or wt gain, classically pleuritic or exertional cp,  orthopnea pnd or arm/hand swelling  or leg swelling, presyncope, palpitations, abdominal pain, anorexia, nausea, vomiting, diarrhea  or change in bowel habits or change in bladder habits, change in stools or change in urine, dysuria, hematuria,  rash, arthralgias, visual  complaints, headache, numbness, weakness or ataxia or problems with walking or coordination,  change in mood or  memory.        Current Meds  Medication Sig   albuterol (VENTOLIN HFA) 108 (90 Base) MCG/ACT inhaler Inhale 2 puffs into the lungs every 4 (four) hours as needed for wheezing or shortness of breath.   aspirin 325 MG tablet Take 325 mg by mouth every other day.    budesonide-formoterol (SYMBICORT) 160-4.5 MCG/ACT inhaler INHALE TWO PUFFS TWICE DAILY   Cholecalciferol (VITAMIN D3) 5000 UNITS CAPS Take 1 tablet by mouth. 2-3 x week   diclofenac sodium (VOLTAREN) 1 % GEL Apply 4 g topically 2 (two) times daily.   DULoxetine (CYMBALTA) 60 MG capsule Take 1 capsule (60 mg total) by mouth 2 (two) times daily.   HYDROcodone-acetaminophen (NORCO/VICODIN) 5-325 MG tablet Take 0.5 tablets by mouth every 6 (six) hours as needed for moderate pain.   HYDROcodone-acetaminophen (NORCO/VICODIN) 5-325 MG tablet Take 0.5 tablets by mouth every 6 (six) hours as needed for moderate pain.   [START ON 08/14/2023] HYDROcodone-acetaminophen (NORCO/VICODIN) 5-325 MG tablet Take 0.5 tablets by mouth every 6 (six) hours as needed for moderate pain.   rosuvastatin (CRESTOR) 10 MG tablet Take 1 tablet (10 mg total) by mouth daily.   tamsulosin (FLOMAX) 0.4 MG CAPS capsule Take 2 capsules (0.8 mg total) by mouth at bedtime. For urine flow and prostate   Current Facility-Administered Medications for the 07/20/23 encounter (Office Visit) with Nyoka Cowden, MD  Medication   betamethasone acetate-betamethasone sodium phosphate (CELESTONE) injection 6 mg   betamethasone acetate-betamethasone sodium phosphate (CELESTONE) injection 6 mg               Past Medical History:  Diagnosis Date   Anxiety    Arthritis    Bilateral carotid artery stenosis    Minimal 2012    Colon polyps - attenuated polyposis syndrome    multiple    COPD (chronic obstructive pulmonary disease) (HCC)    Duodenitis    Gastritis     Hemorrhoids    Hyperlipidemia    Hyperplasia of prostate    IBS (irritable bowel syndrome)    Pre-diabetes    Sigmoid diverticulosis    mild         Objective:    Wts   07/20/2023     151   12/25/22 154 lb 12.8 oz (70.2 kg)  12/10/22 154 lb 12.8 oz (70.2 kg)  11/11/22 153 lb 12.8 oz (69.8 kg)  Vital signs reviewed  07/20/2023  - Note at rest 02 sats  92% on RA   General appearance:    amb wm nad/ struggles a bit to get up on exam table   HEENT :  Oropharynx  clear      NECK :  without JVD/Nodes/TM/ nl carotid upstrokes bilaterally   LUNGS: no acc muscle use,  Mod barrel  contour chest wall with bilateral  Distant bs s audible wheeze and  without cough on insp or exp maneuvers and mod  Hyperresonant  to  percussion bilaterally     CV:  RRR  no s3 or murmur or increase in P2, and no edema   ABD:  soft and nontender with pos mid insp Hoover's  in the supine position. No bruits or organomegaly appreciated, bowel sounds nl  MS:   Ext warm without deformities or   obvious joint restrictions , calf tenderness, cyanosis or clubbing  SKIN: warm and dry without lesions    NEURO:  alert, approp, nl sensorium with  no motor or cerebellar deficits apparent.                Assessment

## 2023-07-20 ENCOUNTER — Encounter: Payer: Self-pay | Admitting: Internal Medicine

## 2023-07-20 ENCOUNTER — Ambulatory Visit: Payer: Medicare Other | Admitting: Internal Medicine

## 2023-07-20 VITALS — BP 127/69 | HR 90 | Ht 66.0 in | Wt 151.0 lb

## 2023-07-20 DIAGNOSIS — J449 Chronic obstructive pulmonary disease, unspecified: Secondary | ICD-10-CM

## 2023-07-20 DIAGNOSIS — F17210 Nicotine dependence, cigarettes, uncomplicated: Secondary | ICD-10-CM | POA: Diagnosis not present

## 2023-07-20 NOTE — Assessment & Plan Note (Signed)
Active smoker/MM - Labs ordered 11/11/2022  :  allergy screen Eos 0.1  alpha one AT phenotype MM level 171  - 11/11/2022  After extensive coaching inhaler device,  effectiveness =    75% from baseline 25% > continue symbicort 160 2bid/ mucinex/ flutter valve ordered   - 11/11/2022   Walked on RA  x  3  lap(s) =  approx 450  ft  @ slow pace, stopped due to end of study with lowest 02 sats 91% and mild sob   - PFT's  1.13  FEV1 45 (29 % ) ratio 0.45   p 26 % improvement from saba p ?symb?  prior to study with DLCO  13 (60%%)   and FV curve classic curvature    - CT chest 12/16/22 mild emphysema  07/20/2023  After extensive coaching inhaler device,  effectiveness =    80% (short ti, delayed trigger at baseline)   Group D (now reclassified as E) in terms of symptom/risk and laba/lama/ICS  therefore appropriate rx at this point >>>  Due to bladder issues avoiding lama here so continue symbicort 160 2bid  F/u q 6 m, sooner prn          Each maintenance medication was reviewed in detail including emphasizing most importantly the difference between maintenance and prns and under what circumstances the prns are to be triggered using an action plan format where appropriate.  Total time for H and P, chart review, counseling, reviewing hfa device(s) and generating customized AVS unique to this office visit / same day charting = 

## 2023-07-20 NOTE — Patient Instructions (Signed)
No change in medications    Please schedule a follow up visit in 6  months but call sooner if needed  

## 2023-07-20 NOTE — Assessment & Plan Note (Signed)
4-5 min discussion re active cigarette smoking in addition to office E&M  Ask about tobacco use:   ongoing  Advise quitting   I took an extended  opportunity with this patient to outline the consequences of continued cigarette use  in airway disorders based on all the data we have from the multiple national lung health studies (perfomed over decades at millions of dollars in cost)  indicating that smoking cessation, not choice of inhalers or pulmonary physicians, is the most important aspect of his care.   Assess willingness:  Not committed at this point Assist in quit attempt:  Per PCP when ready Arrange follow up:   Follow up per Primary Care planned   Rec keep up with LDSCT q year, due in 11/2023

## 2023-07-21 ENCOUNTER — Ambulatory Visit (HOSPITAL_COMMUNITY)
Admission: RE | Admit: 2023-07-21 | Discharge: 2023-07-21 | Disposition: A | Discharge status: Home / Self Care | Payer: Medicare Other | Source: Ambulatory Visit | Attending: Cardiology | Admitting: Cardiology

## 2023-07-21 DIAGNOSIS — Z136 Encounter for screening for cardiovascular disorders: Secondary | ICD-10-CM | POA: Insufficient documentation

## 2023-07-21 DIAGNOSIS — F17210 Nicotine dependence, cigarettes, uncomplicated: Secondary | ICD-10-CM | POA: Insufficient documentation

## 2023-07-21 DIAGNOSIS — I6523 Occlusion and stenosis of bilateral carotid arteries: Secondary | ICD-10-CM | POA: Diagnosis not present

## 2023-07-21 DIAGNOSIS — Z720 Tobacco use: Secondary | ICD-10-CM

## 2023-07-21 DIAGNOSIS — R931 Abnormal findings on diagnostic imaging of heart and coronary circulation: Secondary | ICD-10-CM

## 2023-07-21 DIAGNOSIS — I6529 Occlusion and stenosis of unspecified carotid artery: Secondary | ICD-10-CM | POA: Insufficient documentation

## 2023-07-21 DIAGNOSIS — R0989 Other specified symptoms and signs involving the circulatory and respiratory systems: Secondary | ICD-10-CM | POA: Diagnosis not present

## 2023-07-29 ENCOUNTER — Telehealth (HOSPITAL_COMMUNITY): Payer: Self-pay | Admitting: Surgery

## 2023-07-29 NOTE — Telephone Encounter (Signed)
I called the pt to remind him of his stress test tomorrow at 10 am.  I told the pt to check in at the front desk to get registered for the procedure.  The pt was also instructed to not eat or drink 6 hours prior to the test (no caffeine or chocolate), to not smoke 8 hours prior to the test, to wear comfortable clothes, to not wear any lotions/oils, and to bring his albuterol inhaler.  The pt verbalized understanding of these instructions.

## 2023-07-30 ENCOUNTER — Encounter (HOSPITAL_BASED_OUTPATIENT_CLINIC_OR_DEPARTMENT_OTHER)
Admission: RE | Admit: 2023-07-30 | Discharge: 2023-07-30 | Disposition: A | Discharge status: Home / Self Care | Payer: Medicare Other | Source: Ambulatory Visit | Attending: Cardiology | Admitting: Cardiology

## 2023-07-30 ENCOUNTER — Encounter (HOSPITAL_COMMUNITY): Payer: Self-pay

## 2023-07-30 ENCOUNTER — Ambulatory Visit (HOSPITAL_COMMUNITY)
Admission: RE | Admit: 2023-07-30 | Discharge: 2023-07-30 | Disposition: A | Discharge status: Home / Self Care | Payer: Medicare Other | Source: Ambulatory Visit | Attending: Cardiology | Admitting: Cardiology

## 2023-07-30 DIAGNOSIS — R931 Abnormal findings on diagnostic imaging of heart and coronary circulation: Secondary | ICD-10-CM

## 2023-07-30 DIAGNOSIS — R0602 Shortness of breath: Secondary | ICD-10-CM | POA: Diagnosis not present

## 2023-07-30 HISTORY — DX: Essential (primary) hypertension: I10

## 2023-07-30 HISTORY — DX: Unspecified asthma, uncomplicated: J45.909

## 2023-07-30 LAB — NM MYOCAR MULTI W/SPECT W/WALL MOTION / EF
Base ST Depression (mm): 0 mm
LV dias vol: 108 mL (ref 62–150)
LV sys vol: 44 mL
Nuc Stress EF: 60 %
Peak HR: 108 {beats}/min
RATE: 0.3
Rest HR: 85 {beats}/min
Rest Nuclear Isotope Dose: 9.3 mCi
SDS: 1
SRS: 8
SSS: 9
ST Depression (mm): 0 mm
Stress Nuclear Isotope Dose: 25.4 mCi
TID: 1.07

## 2023-07-30 MED ORDER — TECHNETIUM TC 99M TETROFOSMIN IV KIT
30.0000 | PACK | Freq: Once | INTRAVENOUS | Status: AC | PRN
  Administered 2023-07-30: 25.4 via INTRAVENOUS

## 2023-07-30 MED ORDER — SODIUM CHLORIDE FLUSH 0.9 % IV SOLN
INTRAVENOUS | Status: AC
  Administered 2023-07-30: 10 mL via INTRAVENOUS
  Filled 2023-07-30: qty 10

## 2023-07-30 MED ORDER — TECHNETIUM TC 99M TETROFOSMIN IV KIT
10.0000 | PACK | Freq: Once | INTRAVENOUS | Status: AC | PRN
  Administered 2023-07-30: 9.3 via INTRAVENOUS

## 2023-07-30 MED ORDER — REGADENOSON 0.4 MG/5ML IV SOLN
INTRAVENOUS | Status: AC
  Administered 2023-07-30: 0.4 mg via INTRAVENOUS
  Filled 2023-07-30: qty 5

## 2023-08-31 ENCOUNTER — Ambulatory Visit (HOSPITAL_COMMUNITY)
Admission: RE | Admit: 2023-08-31 | Discharge: 2023-08-31 | Disposition: A | Discharge status: Home / Self Care | Payer: Medicare Other | Source: Ambulatory Visit | Attending: Family Medicine | Admitting: Family Medicine

## 2023-08-31 DIAGNOSIS — Z122 Encounter for screening for malignant neoplasm of respiratory organs: Secondary | ICD-10-CM | POA: Diagnosis not present

## 2023-08-31 DIAGNOSIS — F17210 Nicotine dependence, cigarettes, uncomplicated: Secondary | ICD-10-CM | POA: Insufficient documentation

## 2023-08-31 DIAGNOSIS — Z87891 Personal history of nicotine dependence: Secondary | ICD-10-CM | POA: Insufficient documentation

## 2023-09-14 ENCOUNTER — Encounter: Payer: Self-pay | Admitting: Family Medicine

## 2023-09-14 ENCOUNTER — Ambulatory Visit (INDEPENDENT_AMBULATORY_CARE_PROVIDER_SITE_OTHER): Payer: Medicare Other | Admitting: Family Medicine

## 2023-09-14 VITALS — BP 135/65 | HR 88 | Temp 98.0°F | Ht 66.0 in | Wt 150.8 lb

## 2023-09-14 DIAGNOSIS — N401 Enlarged prostate with lower urinary tract symptoms: Secondary | ICD-10-CM

## 2023-09-14 DIAGNOSIS — M5126 Other intervertebral disc displacement, lumbar region: Secondary | ICD-10-CM | POA: Diagnosis not present

## 2023-09-14 DIAGNOSIS — E782 Mixed hyperlipidemia: Secondary | ICD-10-CM | POA: Diagnosis not present

## 2023-09-14 DIAGNOSIS — R351 Nocturia: Secondary | ICD-10-CM

## 2023-09-14 MED ORDER — FINASTERIDE 5 MG PO TABS: 5.0000 mg | ORAL_TABLET | Freq: Every day | ORAL | 3 refills | Status: DC

## 2023-09-14 MED ORDER — HYDROCODONE-ACETAMINOPHEN 5-325 MG PO TABS: 0.5000 | ORAL_TABLET | Freq: Four times a day (QID) | ORAL | 0 refills | Status: DC | PRN

## 2023-09-14 MED ORDER — HYDROCODONE-ACETAMINOPHEN 5-325 MG PO TABS
0.5000 | ORAL_TABLET | Freq: Four times a day (QID) | ORAL | 0 refills | Status: DC | PRN
Start: 2023-09-14 — End: 2023-12-14

## 2023-09-14 MED ORDER — TAMSULOSIN HCL 0.4 MG PO CAPS: 0.8000 mg | ORAL_CAPSULE | Freq: Every day | ORAL | 3 refills | Status: DC

## 2023-09-14 NOTE — Progress Notes (Signed)
Subjective:  Patient ID: Joshua Bowman, male    DOB: Sep 03, 1946  Age: 77 y.o. MRN: [id number]  CC: Medical Management of Chronic Issues   HPI IRVIN HOMANN presents for pain control. Using Hydrocodone 10 MME daily. Some tightness currently, right lumbar.    in for follow-up of elevated cholesterol. Doing well without complaints on current medication. Denies side effects of statin including myalgia and arthralgia and nausea. Currently no chest pain, shortness of breath or other cardiovascular related symptoms noted.  Up at night 2 times. Sometimes more in spite of use of tamsulosin     09/14/2023    1:07 PM 09/14/2023   12:55 PM 06/15/2023    1:27 PM  Depression screen PHQ 2/9  Decreased Interest 0 0 0  Down, Depressed, Hopeless 0 0 0  PHQ - 2 Score 0 0 0  Altered sleeping   1  Tired, decreased energy   0  Change in appetite   0  Feeling bad or failure about yourself    0  Trouble concentrating   0  Moving slowly or fidgety/restless   0  Suicidal thoughts   0  PHQ-9 Score   1  Difficult doing work/chores   Not difficult at all    History Deklyn has a past medical history of Anxiety, Arthritis, Asthma, Bilateral carotid artery stenosis, Colon polyps - attenuated polyposis syndrome, COPD (chronic obstructive pulmonary disease) (HCC), Duodenitis, Gastritis, Hemorrhoids, Hyperlipidemia, Hyperplasia of prostate, Hypertension, IBS (irritable bowel syndrome), Pre-diabetes, and Sigmoid diverticulosis.   He has a past surgical history that includes Appendectomy (1969); nasal surgery ( spurs ) (70's); lipoma rt shoulder (01/2001); rt. shoulder -rotator cuff repair (08/2005); Colonoscopy; Esophagogastroduodenoscopy; Tonsillectomy; Eye surgery; and Cataract extraction (Right).   His family history includes Arthritis in his sister; Congestive Heart Failure in his father; Heart attack in his brother; Heart disease in his brother and mother; Post-traumatic stress disorder in his father;  Prostate cancer in his brother.He reports that he has been smoking cigarettes. He started smoking about 42 years ago. He has a 45.3 pack-year smoking history. He has quit using smokeless tobacco. He reports that he does not currently use alcohol. He reports that he does not use drugs.    ROS Review of Systems  Constitutional:  Negative for fever.  Respiratory:  Negative for shortness of breath.   Cardiovascular:  Negative for chest pain.  Genitourinary:  Positive for difficulty urinating and frequency.  Musculoskeletal:  Positive for arthralgias (knees) and back pain.  Skin:  Negative for rash.    Objective:  BP 135/65   Pulse 88   Temp 98 F (36.7 C)   Ht 5\' 6"  (1.676 m)   Wt 150 lb 12.8 oz (68.4 kg)   SpO2 94%   BMI 24.34 kg/m   BP Readings from Last 3 Encounters:  09/14/23 135/65  07/20/23 127/69  07/08/23 112/68    Wt Readings from Last 3 Encounters:  09/14/23 150 lb 12.8 oz (68.4 kg)  07/20/23 151 lb (68.5 kg)  07/08/23 151 lb (68.5 kg)     Physical Exam Vitals reviewed.  Constitutional:      Appearance: He is well-developed.  HENT:     Head: Normocephalic and atraumatic.     Right Ear: External ear normal.     Left Ear: External ear normal.     Mouth/Throat:     Pharynx: No oropharyngeal exudate or posterior oropharyngeal erythema.  Eyes:     Pupils: Pupils are equal, round,  and reactive to light.  Cardiovascular:     Rate and Rhythm: Normal rate and regular rhythm.     Heart sounds: No murmur heard. Pulmonary:     Effort: No respiratory distress.     Breath sounds: Normal breath sounds.  Musculoskeletal:     Cervical back: Normal range of motion and neck supple.  Neurological:     Mental Status: He is alert and oriented to person, place, and time.       Assessment & Plan:   Maykel was seen today for medical management of chronic issues.  Diagnoses and all orders for this visit:  Benign prostatic hyperplasia with nocturia -      CMP14+EGFR -     Lipid panel  Lumbar herniated disc -     HYDROcodone-acetaminophen (NORCO/VICODIN) 5-325 MG tablet; Take 0.5 tablets by mouth every 6 (six) hours as needed for moderate pain (pain score 4-6). -     HYDROcodone-acetaminophen (NORCO/VICODIN) 5-325 MG tablet; Take 0.5 tablets by mouth every 6 (six) hours as needed for moderate pain (pain score 4-6). -     HYDROcodone-acetaminophen (NORCO/VICODIN) 5-325 MG tablet; Take 0.5 tablets by mouth every 6 (six) hours as needed for moderate pain (pain score 4-6). -     CMP14+EGFR -     Lipid panel  Mixed hyperlipidemia -     CMP14+EGFR -     Lipid panel  Other orders -     tamsulosin (FLOMAX) 0.4 MG CAPS capsule; Take 2 capsules (0.8 mg total) by mouth at bedtime. For urine flow and prostate -     finasteride (PROSCAR) 5 MG tablet; Take 1 tablet (5 mg total) by mouth daily. For urine flow       I have changed Markeise W. Durley's HYDROcodone-acetaminophen, HYDROcodone-acetaminophen, and HYDROcodone-acetaminophen. I am also having him start on finasteride. Additionally, I am having him maintain his aspirin, Vitamin D3, diclofenac sodium, albuterol, DULoxetine, budesonide-formoterol, rosuvastatin, and tamsulosin. We will continue to administer betamethasone acetate-betamethasone sodium phosphate and betamethasone acetate-betamethasone sodium phosphate.  Allergies as of 09/14/2023       Reactions   Lipitor [atorvastatin] Other (See Comments)   myalgias   Sulfa Antibiotics Itching, Rash        Medication List        Accurate as of September 14, 2023  1:22 PM. If you have any questions, ask your nurse or doctor.          albuterol 108 (90 Base) MCG/ACT inhaler Commonly known as: VENTOLIN HFA Inhale 2 puffs into the lungs every 4 (four) hours as needed for wheezing or shortness of breath.   aspirin 325 MG tablet Take 325 mg by mouth every other day.   budesonide-formoterol 160-4.5 MCG/ACT inhaler Commonly known as:  Symbicort INHALE TWO PUFFS TWICE DAILY   diclofenac sodium 1 % Gel Commonly known as: Voltaren Apply 4 g topically 2 (two) times daily.   DULoxetine 60 MG capsule Commonly known as: CYMBALTA Take 1 capsule (60 mg total) by mouth 2 (two) times daily.   finasteride 5 MG tablet Commonly known as: Proscar Take 1 tablet (5 mg total) by mouth daily. For urine flow Started by: Broadus John Alyannah Sanks   HYDROcodone-acetaminophen 5-325 MG tablet Commonly known as: NORCO/VICODIN Take 0.5 tablets by mouth every 6 (six) hours as needed for moderate pain (pain score 4-6). What changed: Another medication with the same name was changed. Make sure you understand how and when to take each. Changed by: Broadus John Braelin Costlow   HYDROcodone-acetaminophen  5-325 MG tablet Commonly known as: NORCO/VICODIN Take 0.5 tablets by mouth every 6 (six) hours as needed for moderate pain (pain score 4-6). Start taking on: October 14, 2023 What changed: These instructions start on October 14, 2023. If you are unsure what to do until then, ask your doctor or other care provider. Changed by: Broadus John Shanice Poznanski   HYDROcodone-acetaminophen 5-325 MG tablet Commonly known as: NORCO/VICODIN Take 0.5 tablets by mouth every 6 (six) hours as needed for moderate pain (pain score 4-6). Start taking on: November 13, 2023 What changed: These instructions start on November 13, 2023. If you are unsure what to do until then, ask your doctor or other care provider. Changed by: Gaje Tennyson   rosuvastatin 10 MG tablet Commonly known as: CRESTOR Take 1 tablet (10 mg total) by mouth daily.   tamsulosin 0.4 MG Caps capsule Commonly known as: FLOMAX Take 2 capsules (0.8 mg total) by mouth at bedtime. For urine flow and prostate   Vitamin D3 125 MCG (5000 UT) Caps Take 1 tablet by mouth. 2-3 x week         Follow-up: Return in about 3 months (around 12/13/2023).  Mechele Claude, M.D.

## 2023-09-15 LAB — CMP14+EGFR
ALT: 8 [IU]/L (ref 0–44)
AST: 13 [IU]/L (ref 0–40)
Albumin: 4.1 g/dL (ref 3.8–4.8)
Alkaline Phosphatase: 66 [IU]/L (ref 44–121)
BUN/Creatinine Ratio: 17 (ref 10–24)
BUN: 16 mg/dL (ref 8–27)
Bilirubin Total: 0.3 mg/dL (ref 0.0–1.2)
CO2: 24 mmol/L (ref 20–29)
Calcium: 9.5 mg/dL (ref 8.6–10.2)
Chloride: 101 mmol/L (ref 96–106)
Creatinine, Ser: 0.94 mg/dL (ref 0.76–1.27)
Globulin, Total: 1.7 g/dL (ref 1.5–4.5)
Glucose: 104 mg/dL — ABNORMAL HIGH (ref 70–99)
Potassium: 4.8 mmol/L (ref 3.5–5.2)
Sodium: 140 mmol/L (ref 134–144)
Total Protein: 5.8 g/dL — ABNORMAL LOW (ref 6.0–8.5)
eGFR: 83 mL/min/{1.73_m2} (ref >59)

## 2023-09-15 LAB — LIPID PANEL
Chol/HDL Ratio: 2.3 {ratio} (ref 0.0–5.0)
Cholesterol, Total: 151 mg/dL (ref 100–199)
HDL: 67 mg/dL (ref >39)
LDL Chol Calc (NIH): 72 mg/dL (ref 0–99)
Triglycerides: 55 mg/dL (ref 0–149)
VLDL Cholesterol Cal: 12 mg/dL (ref 5–40)

## 2023-09-15 NOTE — Progress Notes (Signed)
Hello Ebb,  Your lab result is normal and/or stable.Some minor variations that are not significant are commonly marked abnormal, but do not represent any medical problem for you.  Best regards, Claretta Fraise, M.D.

## 2023-09-16 ENCOUNTER — Telehealth: Payer: Self-pay | Admitting: Acute Care

## 2023-09-16 NOTE — Telephone Encounter (Signed)
CT from 09/14/23

## 2023-10-01 NOTE — Telephone Encounter (Signed)
 LVM for call office and review results.

## 2023-10-01 NOTE — Telephone Encounter (Signed)
 Patient had LDCT on 08/31/2023 that resulted as a LR0 due to debris in right middle lobe bronchus. Per Lauraine Lites NP patient will need follow up scan due 10/17/2023. Will need to call patient to relay results and schedule scan.    IMPRESSION: 1. Debris in the right middle lobe bronchus with increased volume loss in the medial segment right middle lobe. Lung-RADS 0, incomplete. Recommend follow-up low-dose lung cancer screening CT in 1 month from today's study date, as a centrally obstructing lesion cannot be definitively excluded. These results will be called to the ordering clinician or representative by the Radiologist Assistant, and communication documented in the PACS or [employer]. 2. Aortic atherosclerosis (ICD10-I70.0). Coronary artery calcification. 3.  Emphysema (ICD10-J43.9).     Electronically Signed   By: Newell Eke M.D.   On: 09/16/2023 13:13

## 2023-10-02 ENCOUNTER — Other Ambulatory Visit: Payer: Self-pay

## 2023-10-02 DIAGNOSIS — F17210 Nicotine dependence, cigarettes, uncomplicated: Secondary | ICD-10-CM

## 2023-10-02 DIAGNOSIS — Z87891 Personal history of nicotine dependence: Secondary | ICD-10-CM

## 2023-10-02 DIAGNOSIS — Z122 Encounter for screening for malignant neoplasm of respiratory organs: Secondary | ICD-10-CM

## 2023-10-02 NOTE — Telephone Encounter (Signed)
 Spoke with patient and reviewed results. Patient verbalized understanding. CT ordered and scheduled for 10/20/2023 at 1:30pm at [facility].

## 2023-10-09 ENCOUNTER — Other Ambulatory Visit: Payer: Self-pay | Admitting: Family Medicine

## 2023-10-20 ENCOUNTER — Ambulatory Visit (HOSPITAL_COMMUNITY)
Admission: RE | Admit: 2023-10-20 | Discharge: 2023-10-20 | Disposition: A | Discharge status: Home / Self Care | Payer: Medicare Other | Source: Ambulatory Visit | Attending: Family Medicine | Admitting: Family Medicine

## 2023-10-20 DIAGNOSIS — Z87891 Personal history of nicotine dependence: Secondary | ICD-10-CM | POA: Insufficient documentation

## 2023-10-20 DIAGNOSIS — F17210 Nicotine dependence, cigarettes, uncomplicated: Secondary | ICD-10-CM | POA: Insufficient documentation

## 2023-10-20 DIAGNOSIS — Z122 Encounter for screening for malignant neoplasm of respiratory organs: Secondary | ICD-10-CM | POA: Insufficient documentation

## 2023-10-27 ENCOUNTER — Telehealth: Payer: Self-pay | Admitting: Acute Care

## 2023-10-27 NOTE — Telephone Encounter (Signed)
  IMPRESSION: 1. Lung-RADS 4Bs, suspicious. Additional imaging evaluation or consultation with Pulmonology or Thoracic Surgery recommended. 2. S modifier above refers to the partial atelectasis of the right middle lobe, which appears partially improved when compared with the exam from 08/31/2023. There is continued luminal compromise of the right middle lobe bronchus. The luminal narrowing of the right middle lobe bronchus may reflect sequelae of chronic bronchomalacia versus underlying endobronchial lesion. 3. Coronary artery calcifications. 4.  Aortic Atherosclerosis (ICD10-I70.0).

## 2023-10-28 ENCOUNTER — Telehealth: Payer: Self-pay | Admitting: Acute Care

## 2023-10-28 NOTE — Telephone Encounter (Signed)
Dr. Sherene Sires, This patient 's scan was read as a 4 B. Dr. Jayme Cloud looked at it for me. She feels this is right middle lobe syndrome. It fits his bronchomalacia and the waxing and waning process. She feels it may be more useful here to do  an airway exam via bronchoscopy.Do you want to  take a look and see if there is anything of note or just continue to observe and step up pulmonary hygiene.  This is his last scan based on age, we can do a 3 month follow up , or do you want to get him in and do an airway inspection? Let me know. Thanks

## 2023-11-06 ENCOUNTER — Telehealth: Payer: Self-pay | Admitting: Acute Care

## 2023-11-06 NOTE — Telephone Encounter (Signed)
 Margit Shelling, NP has called and spoken with the patient. Please see provider note on 11/06/2023.

## 2023-11-06 NOTE — Telephone Encounter (Signed)
 I have called the patient with results of his low-dose screening CT.  I explained that while his scan did show some improvement in the degree of right middle lobe collapse recommendation is for a flexible bronchoscopy to evaluate for intra bronchial lesion. I have messaged Dr. Isadora to see if he can do the procedure on one of his Thursday mornings in the [place] bronc lab.  Patient would need a video visit to explain the procedure prior. If he cannot get patient scheduled then we will schedule the patient with me to go over the procedure and then any of the physicians at the [facility] office can do the procedure. Patient is also complaining of worsening shortness of breath.  He is using his inhalers.  I encouraged him to call Dr. Rosanna office to be seen.  He verbalized understanding We also discussed that having the bronc may actually prove to reinflate the right middle lobe which may resolve his shortness of breath. Karna Karna [place], and [place] please follow to see if Dr. Isadora can schedule patient. If not please let me know and I will get the patient placed on my schedule. Thank you

## 2023-11-11 NOTE — Telephone Encounter (Addendum)
Called and spoke to pt. MyChart video visit scheduled with Dr. Aundria Rud on 2/19 prior to bronch to discuss procedure. Bronch has not been scheduled yet. Will forward to [facility] triage and Dr. Aundria Rud.

## 2023-11-18 ENCOUNTER — Encounter: Payer: Self-pay | Admitting: Student in an Organized Health Care Education/Training Program

## 2023-11-18 ENCOUNTER — Telehealth: Payer: Medicare Other | Admitting: Student in an Organized Health Care Education/Training Program

## 2023-11-18 VITALS — Ht 66.0 in | Wt 148.5 lb

## 2023-11-18 DIAGNOSIS — J9819 Other pulmonary collapse: Secondary | ICD-10-CM | POA: Insufficient documentation

## 2023-11-18 DIAGNOSIS — J398 Other specified diseases of upper respiratory tract: Secondary | ICD-10-CM | POA: Diagnosis not present

## 2023-11-18 NOTE — Progress Notes (Signed)
 Assessment & Plan:   1. Tracheobronchomalacia (Primary) 2. Right middle lobe syndrome  Patient has a history of smoking with obstructive lung disease and bronchodilator response on pulmonary function testing.  He has had multiple chest CTs for lung cancer screening which I have personally reviewed.  There appears to be atelectasis in the right middle lobe as well as saber-sheath appearing trachea and bronchomalacia extending to the distal airways. The most recent CT scan showing potential luminal compromise in the right middle lobe bronchus which could be secondary to malacia, endobronchial lesion, or stenosis of the right middle lobe.  I have discussed with Joshua Bowman the potential for fiberoptic evaluation of the airway to establish the diagnosis and evaluate the right middle lobe bronchus.  Will also plan for bronchoalveolar lavage in the right middle lobe to obtain a sample for culture to rule out NTM and fungal infections.  I have explained to him the risks and benefits of the procedure, which include bleeding and hypoxia.  Patient is consenting to the procedure and we will arrange for flexible bronchoscopy in the next few weeks.   I spent 30 minutes caring for this patient today, including preparing to see the patient, obtaining a medical history , reviewing a separately obtained history, performing a medically appropriate examination and/or evaluation, counseling and educating the patient/family/caregiver, ordering medications, tests, or procedures, and documenting clinical information in the electronic health record  Joshua Chute, MD [facility] 11/18/2023 3:14 PM    End of visit medications:  No orders of the defined types were placed in this encounter.    Current Outpatient Medications:    albuterol (VENTOLIN HFA) 108 (90 Base) MCG/ACT inhaler, INHALE TWO PUFFS EVERY 4 HOURS AS NEEDED FOR WHEEZING OR SHORTNESS OF BREATH, Disp: 18 g, Rfl: 1   aspirin 325 MG  tablet, Take 325 mg by mouth 2 (two) times a week., Disp: , Rfl:    budesonide-formoterol (SYMBICORT) 160-4.5 MCG/ACT inhaler, INHALE TWO PUFFS TWICE DAILY, Disp: 10.2 g, Rfl: 4   Cholecalciferol (VITAMIN D3) 5000 UNITS CAPS, Take 1 tablet by mouth. 2-3 x week, Disp: , Rfl:    diclofenac sodium (VOLTAREN) 1 % GEL, Apply 4 g topically 2 (two) times daily. (Patient taking differently: Apply 4 g topically as needed.), Disp: 100 g, Rfl: 2   DULoxetine (CYMBALTA) 60 MG capsule, Take 1 capsule (60 mg total) by mouth 2 (two) times daily., Disp: 180 capsule, Rfl: 3   finasteride (PROSCAR) 5 MG tablet, Take 1 tablet (5 mg total) by mouth daily. For urine flow, Disp: 90 tablet, Rfl: 3   HYDROcodone-acetaminophen (NORCO/VICODIN) 5-325 MG tablet, Take 0.5 tablets by mouth every 6 (six) hours as needed for moderate pain (pain score 4-6)., Disp: 60 tablet, Rfl: 0   rosuvastatin (CRESTOR) 10 MG tablet, Take 1 tablet (10 mg total) by mouth daily., Disp: 90 tablet, Rfl: 3   tamsulosin (FLOMAX) 0.4 MG CAPS capsule, Take 2 capsules (0.8 mg total) by mouth at bedtime. For urine flow and prostate, Disp: 180 capsule, Rfl: 3   HYDROcodone-acetaminophen (NORCO/VICODIN) 5-325 MG tablet, Take 0.5 tablets by mouth every 6 (six) hours as needed for moderate pain (pain score 4-6)., Disp: 60 tablet, Rfl: 0   HYDROcodone-acetaminophen (NORCO/VICODIN) 5-325 MG tablet, Take 0.5 tablets by mouth every 6 (six) hours as needed for moderate pain (pain score 4-6)., Disp: 60 tablet, Rfl: 0  Current Facility-Administered Medications:    betamethasone acetate-betamethasone sodium phosphate (CELESTONE) injection 6 mg, 6 mg, Intramuscular, Once,  Joshua Claude, MD   betamethasone acetate-betamethasone sodium phosphate (CELESTONE) injection 6 mg, 6 mg, Intramuscular, Once, Joshua Claude, MD   Subjective:   PATIENT ID: Joshua Bowman: male DOB: June 19, 1946, MRN: [id number]  Chief Complaint  Patient presents with   Consult    Cough,  shortness of breath on exertion and wheezing.     HPI  I connected with  Joshua Bowman on 11/18/23 by a video enabled telemedicine application and verified that I am speaking with the correct person using two identifiers. I connected from my office and the patient connected from their home. I discussed the limitations of evaluation and management by telemedicine. The patient expressed understanding and agreed to proceed.  Patient is a pleasant 78 year old male with a past medical history of smoking followed by Joshua Bowman for shortness of breath, cough, and wheeze.  He is maintained on Symbicort and has required a few courses of prednisone for cough and wheezing.  PFTs from May 2024 showed an obstructive defect with significant bronchodilator response.  Given his smoking history, he was referred to our lung cancer screening program and underwent low-dose CT scan which showed chronic bronchomalacia with atelectasis in the right middle lobe.  This appears improved on the repeat in January 2025.  This January 2025 CT showed persistent luminal compromise of the right middle lobe bronchus for which the patient is referred to me for consideration of bronchoscopy.  He has a history of smoking, with around 45 pack years of smoking history.  Ancillary information including prior medications, full medical/surgical/family/social histories, and PFTs (when available) are listed below and have been reviewed.   Review of Systems  Constitutional:  Negative for chills, fever and weight loss.  Respiratory:  Positive for cough, sputum production, shortness of breath and wheezing. Negative for hemoptysis.   Cardiovascular:  Negative for chest pain.     Objective:   Vitals:   11/18/23 1131  Weight: 148 lb 8 oz (67.4 kg)  Height: 5\' 6"  (1.676 m)    BMI Readings from Last 3 Encounters:  11/18/23 23.97 kg/m  09/14/23 24.34 kg/m  07/20/23 24.37 kg/m   Wt Readings from Last 3 Encounters:  11/18/23 148 lb  8 oz (67.4 kg)  09/14/23 150 lb 12.8 oz (68.4 kg)  07/20/23 151 lb (68.5 kg)      Ancillary Information    Past Medical History:  Diagnosis Date   Anxiety    Arthritis    Asthma    Bilateral carotid artery stenosis    Minimal 2012    Colon polyps - attenuated polyposis syndrome    multiple    COPD (chronic obstructive pulmonary disease) (HCC)    Duodenitis    Gastritis    Hemorrhoids    Hyperlipidemia    Hyperplasia of prostate    Hypertension    IBS (irritable bowel syndrome)    Pre-diabetes    Sigmoid diverticulosis    mild      Family History  Problem Relation Age of Onset   Heart disease Mother        Atrial fib   Prostate cancer Brother    Heart disease Brother    Congestive Heart Failure Father    Post-traumatic stress disorder Father    Arthritis Sister        back issues    Heart attack Brother      Past Surgical History:  Procedure Laterality Date   APPENDECTOMY  1969   CATARACT EXTRACTION Right  COLONOSCOPY     ESOPHAGOGASTRODUODENOSCOPY     EYE SURGERY     laser - left eye - Dr Ashley Royalty   lipoma rt shoulder  01/2001   outpatient    nasal surgery ( spurs )  70's   rt. shoulder -rotator cuff repair  08/2005   Dr. Jillyn Hidden    TONSILLECTOMY      Social History   Socioeconomic History   Marital status: Married    Spouse name: Amada Jupiter   Number of children: 1   Years of education: 15   Highest education level: Associate degree: occupational, Scientist, product/process development, or vocational program  Occupational History   Occupation: retired     Comment: country side IT sales professional   Tobacco Use   Smoking status: Every Day    Current packs/day: 1.00    Average packs/day: 1 pack/day for 45.4 years (45.4 ttl pk-yrs)    Types: Cigarettes    Start date: 09/29/1980   Smokeless tobacco: Former  Building services engineer status: Never Used  Substance and Sexual Activity   Alcohol use: Not Currently   Drug use: No   Sexual activity: Yes  Other Topics Concern   Not  on file  Social History Narrative   Retired from a nursing home.  Lives with second wife.    Social Drivers of Corporate investment banker Strain: Low Risk  (03/11/2023)   Overall Financial Resource Strain (CARDIA)    Difficulty of Paying Living Expenses: Not very hard  Food Insecurity: No Food Insecurity (03/11/2023)   Hunger Vital Sign    Worried About Running Out of Food in the Last Year: Never true    Ran Out of Food in the Last Year: Never true  Transportation Needs: No Transportation Needs (03/11/2023)   PRAPARE - Administrator, Civil Service (Medical): No    Lack of Transportation (Non-Medical): No  Physical Activity: Insufficiently Active (03/11/2023)   Exercise Vital Sign    Days of Exercise per Week: 2 days    Minutes of Exercise per Session: 60 min  Stress: No Stress Concern Present (03/11/2023)   [employer] of Occupational Health - Occupational Stress Questionnaire    Feeling of Stress : Only a little  Social Connections: Moderately Isolated (03/11/2023)   Social Connection and Isolation Panel [NHANES]    Frequency of Communication with Friends and Family: Once a week    Frequency of Social Gatherings with Friends and Family: Once a week    Attends Religious Services: 1 to 4 times per year    Active Member of [employer] or Organizations: No    Attends Banker Meetings: Never    Marital Status: Married  Catering manager Violence: Not At Risk (03/03/2023)   Humiliation, Afraid, Rape, and Kick questionnaire    Fear of Current or Ex-Partner: No    Emotionally Abused: No    Physically Abused: No    Sexually Abused: No     Allergies  Allergen Reactions   Lipitor [Atorvastatin] Other (See Comments)    myalgias   Sulfa Antibiotics Itching and Rash     CBC    Component Value Date/Time   WBC 7.8 06/15/2023 1355   WBC 10.0 05/04/2015 1120   RBC 5.20 06/15/2023 1355   RBC 5.84 05/04/2015 1120   HGB 16.8 06/15/2023 1355   HCT 50.6 06/15/2023  1355   PLT 234 06/15/2023 1355   MCV 97 06/15/2023 1355   MCH 32.3 06/15/2023 1355   MCH  29.1 05/04/2015 1120   MCHC 33.2 06/15/2023 1355   MCHC 32.2 05/04/2015 1120   RDW 12.9 06/15/2023 1355   LYMPHSABS 1.7 06/15/2023 1355   EOSABS 0.1 06/15/2023 1355   BASOSABS 0.1 06/15/2023 1355    Pulmonary Functions Testing Results:    Latest Ref Rng & Units 02/03/2023    1:40 PM  PFT Results  FVC-Pre L 3.19   FVC-Predicted Pre % 91   FVC-Post L 3.94   FVC-Predicted Post % 112   Pre FEV1/FVC % % 28   Post FEV1/FCV % % 29   FEV1-Pre L 0.89   FEV1-Predicted Pre % 35   FEV1-Post L 1.13   DLCO uncorrected ml/min/mmHg 13.07   DLCO UNC% % 60   DLVA Predicted % 57   TLC L 10.05   TLC % Predicted % 161   RV % Predicted % 268     Outpatient Medications Prior to Visit  Medication Sig Dispense Refill   albuterol (VENTOLIN HFA) 108 (90 Base) MCG/ACT inhaler INHALE TWO PUFFS EVERY 4 HOURS AS NEEDED FOR WHEEZING OR SHORTNESS OF BREATH 18 g 1   aspirin 325 MG tablet Take 325 mg by mouth 2 (two) times a week.     budesonide-formoterol (SYMBICORT) 160-4.5 MCG/ACT inhaler INHALE TWO PUFFS TWICE DAILY 10.2 g 4   Cholecalciferol (VITAMIN D3) 5000 UNITS CAPS Take 1 tablet by mouth. 2-3 x week     diclofenac sodium (VOLTAREN) 1 % GEL Apply 4 g topically 2 (two) times daily. (Patient taking differently: Apply 4 g topically as needed.) 100 g 2   DULoxetine (CYMBALTA) 60 MG capsule Take 1 capsule (60 mg total) by mouth 2 (two) times daily. 180 capsule 3   finasteride (PROSCAR) 5 MG tablet Take 1 tablet (5 mg total) by mouth daily. For urine flow 90 tablet 3   HYDROcodone-acetaminophen (NORCO/VICODIN) 5-325 MG tablet Take 0.5 tablets by mouth every 6 (six) hours as needed for moderate pain (pain score 4-6). 60 tablet 0   rosuvastatin (CRESTOR) 10 MG tablet Take 1 tablet (10 mg total) by mouth daily. 90 tablet 3   tamsulosin (FLOMAX) 0.4 MG CAPS capsule Take 2 capsules (0.8 mg total) by mouth at bedtime. For  urine flow and prostate 180 capsule 3   HYDROcodone-acetaminophen (NORCO/VICODIN) 5-325 MG tablet Take 0.5 tablets by mouth every 6 (six) hours as needed for moderate pain (pain score 4-6). 60 tablet 0   HYDROcodone-acetaminophen (NORCO/VICODIN) 5-325 MG tablet Take 0.5 tablets by mouth every 6 (six) hours as needed for moderate pain (pain score 4-6). 60 tablet 0   Facility-Administered Medications Prior to Visit  Medication Dose Route Frequency Provider Last Rate Last Admin   betamethasone acetate-betamethasone sodium phosphate (CELESTONE) injection 6 mg  6 mg Intramuscular Once Joshua Claude, MD       betamethasone acetate-betamethasone sodium phosphate (CELESTONE) injection 6 mg  6 mg Intramuscular Once Joshua Claude, MD

## 2023-12-03 ENCOUNTER — Telehealth: Payer: Self-pay

## 2023-12-03 NOTE — Addendum Note (Signed)
 Addended byRaechel Chute on: 12/03/2023 02:30 PM   Modules accepted: Orders

## 2023-12-03 NOTE — Telephone Encounter (Addendum)
 Flexible bronch  12/17/2023 at 3:00pm  Arrive at 12:30pm. [facility] [address]   Will have to call Endo tomorrow to confirm Anesthesia is ok adding the case. [phone])

## 2023-12-04 ENCOUNTER — Other Ambulatory Visit

## 2023-12-04 NOTE — Telephone Encounter (Addendum)
 Per Endo- They can add the case 3/20 at 3:00pm.  I have scheduled the procedure and notified the patient. I have mailed him the Bronch information as well.   Synetta Fail, please see Bronch info.

## 2023-12-07 NOTE — Telephone Encounter (Signed)
 Noted. Nothing further needed.

## 2023-12-07 NOTE — Telephone Encounter (Signed)
 For the code [address] Prior Auth Not Required Refer # [id number]

## 2023-12-08 ENCOUNTER — Ambulatory Visit: Admit: 2023-12-08 | Admitting: Student in an Organized Health Care Education/Training Program

## 2023-12-08 SURGERY — BRONCHOSCOPY, WITH BIOPSY USING ELECTROMAGNETIC NAVIGATION
Anesthesia: General | Laterality: Bilateral

## 2023-12-14 ENCOUNTER — Ambulatory Visit (INDEPENDENT_AMBULATORY_CARE_PROVIDER_SITE_OTHER): Payer: Medicare Other | Admitting: Family Medicine

## 2023-12-14 ENCOUNTER — Encounter: Payer: Self-pay | Admitting: Family Medicine

## 2023-12-14 VITALS — BP 118/66 | HR 78 | Temp 97.8°F | Ht 66.0 in | Wt 150.0 lb

## 2023-12-14 DIAGNOSIS — N401 Enlarged prostate with lower urinary tract symptoms: Secondary | ICD-10-CM

## 2023-12-14 DIAGNOSIS — M150 Primary generalized (osteo)arthritis: Secondary | ICD-10-CM

## 2023-12-14 DIAGNOSIS — F339 Major depressive disorder, recurrent, unspecified: Secondary | ICD-10-CM

## 2023-12-14 DIAGNOSIS — E782 Mixed hyperlipidemia: Secondary | ICD-10-CM | POA: Diagnosis not present

## 2023-12-14 DIAGNOSIS — R9389 Abnormal findings on diagnostic imaging of other specified body structures: Secondary | ICD-10-CM | POA: Diagnosis not present

## 2023-12-14 DIAGNOSIS — R351 Nocturia: Secondary | ICD-10-CM

## 2023-12-14 DIAGNOSIS — F17210 Nicotine dependence, cigarettes, uncomplicated: Secondary | ICD-10-CM | POA: Diagnosis not present

## 2023-12-14 DIAGNOSIS — M5126 Other intervertebral disc displacement, lumbar region: Secondary | ICD-10-CM | POA: Diagnosis not present

## 2023-12-14 DIAGNOSIS — Z79899 Other long term (current) drug therapy: Secondary | ICD-10-CM | POA: Diagnosis not present

## 2023-12-14 MED ORDER — HYDROCODONE-ACETAMINOPHEN 5-325 MG PO TABS: 0.5000 | ORAL_TABLET | Freq: Four times a day (QID) | ORAL | 0 refills | Status: DC | PRN

## 2023-12-14 MED ORDER — NICOTINE 14 MG/24HR TD PT24: 14.0000 mg | MEDICATED_PATCH | Freq: Every day | TRANSDERMAL | 0 refills | Status: DC

## 2023-12-14 NOTE — Progress Notes (Signed)
 Subjective:  Patient ID: Joshua Bowman, male    DOB: Dec 27, 1945  Age: 78 y.o. MRN: [id number]  CC: Medical Management of Chronic Issues   HPI Joshua Bowman presents for recheck of chronic pain. Affecting back, knees and shoulders.  Pain is stable, tolerable at current dose of hydrocodone. PDMP review shows appropriate utilization of the patient's controlled medications. Follow-up prostate he gets up about twice a night using current medicines.  Feels rested in the morning.  Wants to decrease cymbalta. No depression symptoms for quite  some time now.   Having bronchoscopy for bx with Dr. Salvadore Oxford pulmonologist  in 3 days. CT screen showed 4B lesion in January. Still smoking. Wants to try patch. Smoking 12-15 a day.      12/14/2023    1:00 PM 09/14/2023    1:07 PM 09/14/2023   12:55 PM  Depression screen PHQ 2/9  Decreased Interest 0 0 0  Down, Depressed, Hopeless 0 0 0  PHQ - 2 Score 0 0 0  Altered sleeping 0    Tired, decreased energy 1    Change in appetite 0    Feeling bad or failure about yourself  0    Trouble concentrating 0    Moving slowly or fidgety/restless 0    Suicidal thoughts 0    PHQ-9 Score 1      History Joshua Bowman has a past medical history of Anxiety, Arthritis, Asthma, Bilateral carotid artery stenosis, Colon polyps - attenuated polyposis syndrome, COPD (chronic obstructive pulmonary disease) (HCC), Duodenitis, Gastritis, Hemorrhoids, Hyperlipidemia, Hyperplasia of prostate, Hypertension, IBS (irritable bowel syndrome), Pre-diabetes, and Sigmoid diverticulosis.   He has a past surgical history that includes Appendectomy (1969); nasal surgery ( spurs ) (70's); lipoma rt shoulder (01/2001); rt. shoulder -rotator cuff repair (08/2005); Colonoscopy; Esophagogastroduodenoscopy; Tonsillectomy; Eye surgery; and Cataract extraction (Right).   His family history includes Arthritis in his sister; Congestive Heart Failure in his father; Heart attack in his brother; Heart  disease in his brother and mother; Post-traumatic stress disorder in his father; Prostate cancer in his brother.He reports that he has been smoking cigarettes. He started smoking about 43 years ago. He has a 45.5 pack-year smoking history. He has quit using smokeless tobacco. He reports that he does not currently use alcohol. He reports that he does not use drugs.    ROS Review of Systems  Constitutional:  Negative for fever.  Respiratory:  Negative for shortness of breath (COPD currently being evaluated along with his suspicious lung lesion by pulmonology).   Cardiovascular:  Negative for chest pain.  Musculoskeletal:  Positive for arthralgias and back pain.  Skin:  Negative for rash.    Objective:  BP 118/66   Pulse 78   Temp 97.8 F (36.6 C)   Ht 5\' 6"  (1.676 m)   Wt 150 lb (68 kg)   SpO2 91%   BMI 24.21 kg/m   BP Readings from Last 3 Encounters:  12/14/23 118/66  09/14/23 135/65  07/20/23 127/69    Wt Readings from Last 3 Encounters:  12/14/23 150 lb (68 kg)  11/18/23 148 lb 8 oz (67.4 kg)  09/14/23 150 lb 12.8 oz (68.4 kg)     Physical Exam Vitals reviewed.  Constitutional:      Appearance: He is well-developed.  HENT:     Head: Normocephalic and atraumatic.     Right Ear: External ear normal.     Left Ear: External ear normal.     Mouth/Throat:  Pharynx: No oropharyngeal exudate or posterior oropharyngeal erythema.  Eyes:     Pupils: Pupils are equal, round, and reactive to light.  Cardiovascular:     Rate and Rhythm: Normal rate and regular rhythm.     Heart sounds: No murmur heard. Pulmonary:     Effort: No respiratory distress.     Breath sounds: Normal breath sounds.  Musculoskeletal:     Cervical back: Normal range of motion and neck supple.  Neurological:     Mental Status: He is alert and oriented to person, place, and time.       Assessment & Plan:   Joshua Bowman was seen today for medical management of chronic issues.  Diagnoses and all  orders for this visit:  Controlled substance agreement signed -     HYDROcodone-acetaminophen (NORCO/VICODIN) 5-325 MG tablet; Take 0.5 tablets by mouth every 6 (six) hours as needed for moderate pain (pain score 4-6). -     ToxASSURE Select 13 (MW), Urine  Lumbar herniated disc -     HYDROcodone-acetaminophen (NORCO/VICODIN) 5-325 MG tablet; Take 0.5 tablets by mouth every 6 (six) hours as needed for moderate pain (pain score 4-6). -     HYDROcodone-acetaminophen (NORCO/VICODIN) 5-325 MG tablet; Take 0.5 tablets by mouth every 6 (six) hours as needed for moderate pain (pain score 4-6). -     HYDROcodone-acetaminophen (NORCO/VICODIN) 5-325 MG tablet; Take 0.5 tablets by mouth every 6 (six) hours as needed for moderate pain (pain score 4-6).  Depression, recurrent (HCC)  Abnormal chest CT  Benign prostatic hyperplasia with nocturia  Cigarette smoker  Mixed hyperlipidemia  Primary osteoarthritis involving multiple joints  Other orders -     nicotine (NICODERM CQ - DOSED IN MG/24 HOURS) 14 mg/24hr patch; Place 1 patch (14 mg total) onto the skin daily.       I am having Joshua Bowman start on nicotine. I am also having him maintain his aspirin, Vitamin D3, diclofenac sodium, DULoxetine, budesonide-formoterol, rosuvastatin, tamsulosin, finasteride, albuterol, HYDROcodone-acetaminophen, HYDROcodone-acetaminophen, and HYDROcodone-acetaminophen. We will stop administering betamethasone acetate-betamethasone sodium phosphate and betamethasone acetate-betamethasone sodium phosphate.  Allergies as of 12/14/2023       Reactions   Lipitor [atorvastatin] Other (See Comments)   myalgias   Sulfa Antibiotics Itching, Rash        Medication List        Accurate as of December 14, 2023  1:41 PM. If you have any questions, ask your nurse or doctor.          albuterol 108 (90 Base) MCG/ACT inhaler Commonly known as: VENTOLIN HFA INHALE TWO PUFFS EVERY 4 HOURS AS NEEDED FOR WHEEZING  OR SHORTNESS OF BREATH   aspirin 325 MG tablet Take 325 mg by mouth 2 (two) times a week.   budesonide-formoterol 160-4.5 MCG/ACT inhaler Commonly known as: Symbicort INHALE TWO PUFFS TWICE DAILY   diclofenac sodium 1 % Gel Commonly known as: Voltaren Apply 4 g topically 2 (two) times daily. What changed:  when to take this reasons to take this   DULoxetine 60 MG capsule Commonly known as: CYMBALTA Take 1 capsule (60 mg total) by mouth 2 (two) times daily.   finasteride 5 MG tablet Commonly known as: Proscar Take 1 tablet (5 mg total) by mouth daily. For urine flow   HYDROcodone-acetaminophen 5-325 MG tablet Commonly known as: NORCO/VICODIN Take 0.5 tablets by mouth every 6 (six) hours as needed for moderate pain (pain score 4-6). What changed: Another medication with the same name was changed. Make  sure you understand how and when to take each. Changed by: Broadus John Aydn Ferrara   HYDROcodone-acetaminophen 5-325 MG tablet Commonly known as: NORCO/VICODIN Take 0.5 tablets by mouth every 6 (six) hours as needed for moderate pain (pain score 4-6). Start taking on: January 13, 2024 What changed: These instructions start on January 13, 2024. If you are unsure what to do until then, ask your doctor or other care provider. Changed by: Broadus John Byrd Terrero   HYDROcodone-acetaminophen 5-325 MG tablet Commonly known as: NORCO/VICODIN Take 0.5 tablets by mouth every 6 (six) hours as needed for moderate pain (pain score 4-6). Start taking on: Feb 12, 2024 What changed: These instructions start on Feb 12, 2024. If you are unsure what to do until then, ask your doctor or other care provider. Changed by: Broadus John Izaiha Lo   nicotine 14 mg/24hr patch Commonly known as: NICODERM CQ - dosed in mg/24 hours Place 1 patch (14 mg total) onto the skin daily. Started by: Emarie Paul   rosuvastatin 10 MG tablet Commonly known as: CRESTOR Take 1 tablet (10 mg total) by mouth daily.   tamsulosin 0.4 MG Caps  capsule Commonly known as: FLOMAX Take 2 capsules (0.8 mg total) by mouth at bedtime. For urine flow and prostate   Vitamin D3 125 MCG (5000 UT) Caps Take 1 tablet by mouth. 2-3 x week       We discussed tapering off cigarettes starting with the 14 mcg patch when he gets down to about 5 cigarettes a day so that he is not getting excessive nicotine.  I suggested tapering by 1 cigarette every 2 days.  Counting his supply up the night before.  Limited his access to his carton/supply of cigarettes.  He is in agreement we will try this.  Follow-up: Return in about 3 months (around 03/15/2024).  Mechele Claude, M.D.

## 2023-12-15 ENCOUNTER — Encounter (HOSPITAL_COMMUNITY): Payer: Self-pay | Admitting: Student in an Organized Health Care Education/Training Program

## 2023-12-15 ENCOUNTER — Other Ambulatory Visit: Payer: Self-pay

## 2023-12-15 NOTE — Progress Notes (Signed)
 SDW CALL  Patient was given pre-op instructions over the phone. The opportunity was given for the patient to ask questions. No further questions asked. Patient verbalized understanding of instructions given.   PCP - Farris Has Cardiologist -   PPM/ICD - denies Device Orders -  Rep Notified -   Chest x-ray - 10/20/23 EKG - 07/08/23 Stress Test - 07/30/23 ECHO - denies Cardiac Cath - denies  Sleep Study - denies CPAP -   Fasting Blood Sugar - na Checks Blood Sugar _____ times a day  Blood Thinner Instructions:na Aspirin Instructions:Patient states he has not taken Aspirin in 10 days. He says he takes it mostly for pain; his former PCP prescribed  it.   ERAS Protcol -no PRE-SURGERY Ensure or G2-   COVID TEST- na   Anesthesia review: yes-Carotid artery stenosis,COPD,HTN  Patient denies shortness of breath, fever, cough and chest pain over the phone call    Surgical Instructions    Your procedure is scheduled on Thursday March 20  Report to [facility] Main Entrance "A" at 1230 P.M., then check in with the Admitting office.  Call this number if you have problems the morning of surgery:  [phone]    Remember:  Do not eat or drink anything after midnight the night before your surgery   Take these medicines the morning of surgery with A SIP OF WATER: Cymbalta,Proscar,Crestor,Symbicort  PRN- Albuterol inhaler- bring it to the hospital,Norco.   Follow Dr. Doreene Adas instructions on when to hold Aspirin. If no instructions were given you will need to call Dr. Doreene Adas. Patient states he has not taken Aspirin in 10 days. He says he takes it mostly for pain; his former PCP prescribed  it.   As of today, STOP taking any Aspirin (unless otherwise instructed by your surgeon), diclofenac(Voltaren) gel, Aleve, Naproxen, Ibuprofen, Motrin, Advil, Goody's, BC's, all herbal medications, fish oil, and all vitamins.  [facility] is not responsible for any belongings or  valuables. .   Do NOT Smoke (Tobacco/Vaping)  24 hours prior to your procedure  If you use a CPAP at night, you may bring your mask for your overnight stay.   Contacts, glasses, hearing aids, dentures or partials may not be worn into surgery, please bring cases for these belongings   Patients discharged the day of surgery will not be allowed to drive home, and someone needs to stay with them for 24 hours.   Special instructions:    Oral Hygiene is also important to reduce your risk of infection.  Remember - BRUSH YOUR TEETH THE MORNING OF SURGERY WITH YOUR REGULAR TOOTHPASTE   Day of Surgery:  Take a shower the day of or night before with antibacterial soap. Wear Clean/Comfortable clothing the morning of surgery Do not apply any deodorants/lotions.   Do not wear jewelry or makeup Do not wear lotions, powders, perfumes/colognes, or deodorant. Do not shave 48 hours prior to surgery.  Men may shave face and neck. Do not bring valuables to the hospital. Do not wear nail polish, gel polish, artificial nails, or any other type of covering on natural nails (fingers and toes) If you have artificial nails or gel coating that need to be removed by a nail salon, please have this removed prior to surgery. Artificial nails or gel coating may interfere with anesthesia's ability to adequately monitor your vital signs. Remember to brush your teeth WITH YOUR REGULAR TOOTHPASTE.

## 2023-12-16 NOTE — Anesthesia Preprocedure Evaluation (Addendum)
 Anesthesia Evaluation  Patient identified by MRN, date of birth, ID band Patient awake    Reviewed: Allergy & Precautions, NPO status , Patient's Chart, lab work & pertinent test results  Airway Mallampati: II  TM Distance: >3 FB Neck ROM: Full    Dental  (+) Partial Lower, Partial Upper   Pulmonary asthma , COPD,  COPD inhaler, Current Smoker      rales    Cardiovascular hypertension, + CAD   Rhythm:Regular Rate:Normal     Neuro/Psych  PSYCHIATRIC DISORDERS Anxiety Depression    negative neurological ROS     GI/Hepatic negative GI ROS, Neg liver ROS,,,  Endo/Other  negative endocrine ROS    Renal/GU negative Renal ROS     Musculoskeletal  (+) Arthritis ,    Abdominal   Peds  Hematology negative hematology ROS (+)   Anesthesia Other Findings   Reproductive/Obstetrics                             Anesthesia Physical Anesthesia Plan  ASA: 3  Anesthesia Plan: General   Post-op Pain Management: Minimal or no pain anticipated   Induction: Intravenous  PONV Risk Score and Plan: 1 and Ondansetron, TIVA and Propofol infusion  Airway Management Planned: Oral ETT  Additional Equipment: None  Intra-op Plan:   Post-operative Plan: Extubation in OR  Informed Consent: I have reviewed the patients History and Physical, chart, labs and discussed the procedure including the risks, benefits and alternatives for the proposed anesthesia with the patient or authorized representative who has indicated his/her understanding and acceptance.       Plan Discussed with: CRNA  Anesthesia Plan Comments: (See PAT note from 3/19 by Sherlie Ban PA-C )        Anesthesia Quick Evaluation

## 2023-12-16 NOTE — Progress Notes (Signed)
 Case: [id number] Date/Time: 12/17/23 1500   Procedure: BRONCHOSCOPY, WITH FLUOROSCOPY   Anesthesia type: General   Pre-op diagnosis: LUNG NODULE   [facility] / [facility]   Surgeons: [name], MD       DISCUSSION: Joshua Bowman is a 78 yo male who is a SDW prior to surgery above. PMH of smoking, HTN, CAD (by CT), COPD/asthma, prediabetes, arthritis, chronic pain with narcotic dependence  Patient is followed by Pulmonology for COPD. He is getting serial CT scans for smoking hx and now having a bronch for a suspicious lesion.  Last seen by PCP on 3/17. Seen for chronic pain. All issues stable.  Seen by Cardiology on 07/08/23 for SOB. He underwent stress testing which was low risk. SOB thought to be related to pulmonary issues.  VS: n/a  PROVIDERSMechele Claude, MD Cardiology: Rollene Rotunda, MD Pulmonology: Sandrea Hughs, MD  LABS: n/a   IMAGES: CT Chest 10/20/23:  IMPRESSION: 1. Lung-RADS 4Bs, suspicious. Additional imaging evaluation or consultation with Pulmonology or Thoracic Surgery recommended. 2. S modifier above refers to the partial atelectasis of the right middle lobe, which appears partially improved when compared with the exam from 08/31/2023. There is continued luminal compromise of the right middle lobe bronchus. The luminal narrowing of the right middle lobe bronchus may reflect sequelae of chronic bronchomalacia versus underlying endobronchial lesion. 3. Coronary artery calcifications. 4.  Aortic Atherosclerosis (ICD10-I70.0).  EKG 07/08/23 Normal sinus rhythm, rate 87 Rightward axis Non-specific intra-ventricular conduction block  CV:  Stress test 07/30/23: Narrative & Impression      Stress ECG is negative for ischemia and arrhythmias.   LV perfusion is normal. There is no evidence of ischemia. There is no evidence of infarction.   Left ventricular function is normal. Nuclear stress EF: 60%.   Findings are consistent  with no ischemia and no infarction. The study is low risk.    Past Medical History:  Diagnosis Date   Anxiety    Arthritis    Asthma    Bilateral carotid artery stenosis    Minimal 2012    Colon polyps - attenuated polyposis syndrome    multiple    COPD (chronic obstructive pulmonary disease) (HCC)    Duodenitis    Gastritis    Hemorrhoids    Hyperlipidemia    Hyperplasia of prostate    Hypertension    IBS (irritable bowel syndrome)    Pre-diabetes    Sigmoid diverticulosis    mild     Past Surgical History:  Procedure Laterality Date   APPENDECTOMY  1969   CATARACT EXTRACTION Right    COLONOSCOPY     ESOPHAGOGASTRODUODENOSCOPY     EYE SURGERY     laser - left eye - Dr Ashley Royalty   lipoma rt shoulder  01/2001   outpatient    nasal surgery ( spurs )  70's   rt. shoulder -rotator cuff repair  08/2005   Dr. Jillyn Hidden    TONSILLECTOMY      MEDICATIONS: No current facility-administered medications for this encounter.    albuterol (VENTOLIN HFA) 108 (90 Base) MCG/ACT inhaler   aspirin 325 MG tablet   budesonide-formoterol (SYMBICORT) 160-4.5 MCG/ACT inhaler   Cholecalciferol (VITAMIN D3) 5000 UNITS CAPS   diclofenac sodium (VOLTAREN) 1 % GEL   DULoxetine (CYMBALTA) 60 MG capsule   finasteride (PROSCAR) 5 MG tablet   [START ON 02/12/2024] HYDROcodone-acetaminophen (NORCO/VICODIN) 5-325 MG tablet   HYDROcodone-acetaminophen (NORCO/VICODIN) 5-325 MG tablet   [START ON  01/13/2024] HYDROcodone-acetaminophen (NORCO/VICODIN) 5-325 MG tablet   nicotine (NICODERM CQ - DOSED IN MG/24 HOURS) 14 mg/24hr patch   rosuvastatin (CRESTOR) 10 MG tablet   tamsulosin (FLOMAX) 0.4 MG CAPS capsule    Joshua Glassing, PA-C [facility] Short Stay/Anesthesiology [facility] Phone [phone] 12/16/2023 8:52 AM

## 2023-12-17 ENCOUNTER — Ambulatory Visit (HOSPITAL_COMMUNITY): Admitting: Medical

## 2023-12-17 ENCOUNTER — Ambulatory Visit (HOSPITAL_COMMUNITY)

## 2023-12-17 ENCOUNTER — Ambulatory Visit (HOSPITAL_COMMUNITY)
Admission: RE | Admit: 2023-12-17 | Discharge: 2023-12-17 | Disposition: A | Discharge status: Home / Self Care | Attending: Student in an Organized Health Care Education/Training Program | Admitting: Student in an Organized Health Care Education/Training Program

## 2023-12-17 ENCOUNTER — Encounter (HOSPITAL_COMMUNITY): Payer: Self-pay | Admitting: Student in an Organized Health Care Education/Training Program

## 2023-12-17 ENCOUNTER — Other Ambulatory Visit: Payer: Self-pay

## 2023-12-17 ENCOUNTER — Ambulatory Visit (HOSPITAL_BASED_OUTPATIENT_CLINIC_OR_DEPARTMENT_OTHER): Admitting: Medical

## 2023-12-17 ENCOUNTER — Encounter (HOSPITAL_COMMUNITY)
Admission: RE | Disposition: A | Discharge status: Home / Self Care | Payer: Self-pay | Source: Home / Self Care | Attending: Student in an Organized Health Care Education/Training Program

## 2023-12-17 DIAGNOSIS — R0989 Other specified symptoms and signs involving the circulatory and respiratory systems: Secondary | ICD-10-CM | POA: Insufficient documentation

## 2023-12-17 DIAGNOSIS — J9811 Atelectasis: Secondary | ICD-10-CM | POA: Insufficient documentation

## 2023-12-17 DIAGNOSIS — R911 Solitary pulmonary nodule: Secondary | ICD-10-CM

## 2023-12-17 DIAGNOSIS — M1990 Unspecified osteoarthritis, unspecified site: Secondary | ICD-10-CM | POA: Diagnosis not present

## 2023-12-17 DIAGNOSIS — I700 Atherosclerosis of aorta: Secondary | ICD-10-CM | POA: Insufficient documentation

## 2023-12-17 DIAGNOSIS — Z7951 Long term (current) use of inhaled steroids: Secondary | ICD-10-CM | POA: Diagnosis not present

## 2023-12-17 DIAGNOSIS — F419 Anxiety disorder, unspecified: Secondary | ICD-10-CM | POA: Insufficient documentation

## 2023-12-17 DIAGNOSIS — I2510 Atherosclerotic heart disease of native coronary artery without angina pectoris: Secondary | ICD-10-CM

## 2023-12-17 DIAGNOSIS — J398 Other specified diseases of upper respiratory tract: Secondary | ICD-10-CM | POA: Insufficient documentation

## 2023-12-17 DIAGNOSIS — J9819 Other pulmonary collapse: Secondary | ICD-10-CM | POA: Diagnosis not present

## 2023-12-17 DIAGNOSIS — J449 Chronic obstructive pulmonary disease, unspecified: Secondary | ICD-10-CM

## 2023-12-17 DIAGNOSIS — F32A Depression, unspecified: Secondary | ICD-10-CM | POA: Insufficient documentation

## 2023-12-17 DIAGNOSIS — I10 Essential (primary) hypertension: Secondary | ICD-10-CM

## 2023-12-17 DIAGNOSIS — F17210 Nicotine dependence, cigarettes, uncomplicated: Secondary | ICD-10-CM | POA: Insufficient documentation

## 2023-12-17 DIAGNOSIS — Z7952 Long term (current) use of systemic steroids: Secondary | ICD-10-CM | POA: Insufficient documentation

## 2023-12-17 DIAGNOSIS — R0602 Shortness of breath: Secondary | ICD-10-CM | POA: Diagnosis not present

## 2023-12-17 HISTORY — PX: BRONCHIAL WASHINGS: SHX5105

## 2023-12-17 HISTORY — PX: VIDEO BRONCHOSCOPY: SHX5072

## 2023-12-17 LAB — BASIC METABOLIC PANEL
Anion gap: 9 (ref 5–15)
BUN: 13 mg/dL (ref 8–23)
CO2: 24 mmol/L (ref 22–32)
Calcium: 9.2 mg/dL (ref 8.9–10.3)
Chloride: 103 mmol/L (ref 98–111)
Creatinine, Ser: 0.89 mg/dL (ref 0.61–1.24)
GFR, Estimated: >60 mL/min (ref >60)
Glucose, Bld: 114 mg/dL — ABNORMAL HIGH (ref 70–99)
Potassium: 4.4 mmol/L (ref 3.5–5.1)
Sodium: 136 mmol/L (ref 135–145)

## 2023-12-17 LAB — CBC
HCT: 51.4 % (ref 39.0–52.0)
Hemoglobin: 17.1 g/dL — ABNORMAL HIGH (ref 13.0–17.0)
MCH: 32.6 pg (ref 26.0–34.0)
MCHC: 33.3 g/dL (ref 30.0–36.0)
MCV: 97.9 fL (ref 80.0–100.0)
Platelets: 212 10*3/uL (ref 150–400)
RBC: 5.25 MIL/uL (ref 4.22–5.81)
RDW: 13.5 % (ref 11.5–15.5)
WBC: 8.1 10*3/uL (ref 4.0–10.5)
nRBC: 0 % (ref 0.0–0.2)

## 2023-12-17 SURGERY — BRONCHOSCOPY, WITH FLUOROSCOPY
Anesthesia: General

## 2023-12-17 MED ORDER — ONDANSETRON HCL 4 MG/2ML IJ SOLN
INTRAMUSCULAR | Status: DC | PRN
  Administered 2023-12-17: 4 mg via INTRAVENOUS

## 2023-12-17 MED ORDER — AMISULPRIDE (ANTIEMETIC) 5 MG/2ML IV SOLN: 10.0000 mg | Freq: Once | INTRAVENOUS | Status: DC | PRN

## 2023-12-17 MED ORDER — IPRATROPIUM-ALBUTEROL 0.5-2.5 (3) MG/3ML IN SOLN
3.0000 mL | Freq: Once | RESPIRATORY_TRACT | Status: AC
Start: 2023-12-17 — End: 2023-12-17

## 2023-12-17 MED ORDER — PROPOFOL 500 MG/50ML IV EMUL
INTRAVENOUS | Status: DC | PRN
  Administered 2023-12-17: 75 ug/kg/min via INTRAVENOUS

## 2023-12-17 MED ORDER — PHENYLEPHRINE 80 MCG/ML (10ML) SYRINGE FOR IV PUSH (FOR BLOOD PRESSURE SUPPORT)
PREFILLED_SYRINGE | INTRAVENOUS | Status: DC | PRN
Start: 2023-12-17 — End: 2023-12-17
  Administered 2023-12-17: 160 ug via INTRAVENOUS

## 2023-12-17 MED ORDER — SODIUM CHLORIDE 3 % IN NEBU: INHALATION_SOLUTION | Freq: Two times a day (BID) | RESPIRATORY_TRACT | 12 refills | Status: DC

## 2023-12-17 MED ORDER — IPRATROPIUM-ALBUTEROL 0.5-2.5 (3) MG/3ML IN SOLN
RESPIRATORY_TRACT | Status: AC
  Administered 2023-12-17: 3 mL via RESPIRATORY_TRACT
  Filled 2023-12-17: qty 3

## 2023-12-17 MED ORDER — LACTATED RINGERS IV SOLN: INTRAVENOUS | Status: DC

## 2023-12-17 MED ORDER — PROPOFOL 10 MG/ML IV BOLUS
INTRAVENOUS | Status: DC | PRN
  Administered 2023-12-17: 120 mg via INTRAVENOUS

## 2023-12-17 MED ORDER — LIDOCAINE 2% (20 MG/ML) 5 ML SYRINGE
INTRAMUSCULAR | Status: DC | PRN
  Administered 2023-12-17: 60 mg via INTRAVENOUS

## 2023-12-17 MED ORDER — CHLORHEXIDINE GLUCONATE 0.12 % MT SOLN
OROMUCOSAL | Status: AC
  Administered 2023-12-17: 15 mL via OROMUCOSAL
  Filled 2023-12-17: qty 15

## 2023-12-17 MED ORDER — SUGAMMADEX SODIUM 200 MG/2ML IV SOLN
INTRAVENOUS | Status: DC | PRN
  Administered 2023-12-17: 400 mg via INTRAVENOUS

## 2023-12-17 MED ORDER — CHLORHEXIDINE GLUCONATE 0.12 % MT SOLN: 15.0000 mL | Freq: Once | OROMUCOSAL | Status: AC

## 2023-12-17 MED ORDER — ALBUTEROL SULFATE 1.25 MG/3ML IN NEBU: 1.0000 | INHALATION_SOLUTION | Freq: Four times a day (QID) | RESPIRATORY_TRACT | Status: DC | PRN

## 2023-12-17 MED ORDER — DEXAMETHASONE SODIUM PHOSPHATE 10 MG/ML IJ SOLN
INTRAMUSCULAR | Status: DC | PRN
  Administered 2023-12-17: 5 mg via INTRAVENOUS

## 2023-12-17 MED ORDER — ONDANSETRON HCL 4 MG/2ML IJ SOLN: 4.0000 mg | Freq: Once | INTRAMUSCULAR | Status: DC | PRN

## 2023-12-17 MED ORDER — ROCURONIUM BROMIDE 10 MG/ML (PF) SYRINGE
PREFILLED_SYRINGE | INTRAVENOUS | Status: DC | PRN
  Administered 2023-12-17: 50 mg via INTRAVENOUS

## 2023-12-17 NOTE — Anesthesia Procedure Notes (Signed)
 Procedure Name: Intubation Date/Time: 12/17/2023 2:39 PM  Performed by: April Holding, CRNAPre-anesthesia Checklist: Patient identified, Emergency Drugs available, Suction available and Patient being monitored Patient Re-evaluated:Patient Re-evaluated prior to induction Oxygen Delivery Method: Circle System Utilized Preoxygenation: Pre-oxygenation with 100% oxygen Induction Type: IV induction Ventilation: Mask ventilation without difficulty Laryngoscope Size: Miller and 2 Grade View: Grade II Tube type: Oral Tube size: 8.5 mm Number of attempts: 1 Airway Equipment and Method: Stylet Placement Confirmation: ETT inserted through vocal cords under direct vision, positive ETCO2 and breath sounds checked- equal and bilateral Secured at: 24 cm Tube secured with: Tape Dental Injury: Teeth and Oropharynx as per pre-operative assessment

## 2023-12-17 NOTE — Transfer of Care (Signed)
 Immediate Anesthesia Transfer of Care Note  Patient: Joshua Bowman  Procedure(s) Performed: BRONCHOSCOPY, WITH FLUOROSCOPY IRRIGATION, BRONCHUS  Patient Location: Endoscopy Unit  Anesthesia Type:General  Level of Consciousness: awake, alert , and oriented  Airway & Oxygen Therapy: Patient Spontanous Breathing and Patient connected to face mask oxygen  Post-op Assessment: Report given to RN and Post -op Vital signs reviewed and stable  Post vital signs: Reviewed and stable  Last Vitals:  Vitals Value Taken Time  BP 104/74 12/17/23 1502  Temp 36.4 C 12/17/23 1502  Pulse 83 12/17/23 1510  Resp 16 12/17/23 1510  SpO2 91 % 12/17/23 1510  Vitals shown include unfiled device data.  Last Pain:  Vitals:   12/17/23 1502  TempSrc: Temporal  PainSc: 0-No pain         Complications: No notable events documented.

## 2023-12-17 NOTE — Op Note (Signed)
 Bronchoscopy Procedure Note  Joshua Bowman  [id number]  Jan 20, 1946  Date:12/17/23  Time:2:49 PM   Provider Performing:Joshua Bowman   Procedure(s):  Flexible Bronchoscopy [phone]), Flexible bronchoscopy with bronchial alveolar lavage [phone]), and Initial Therapeutic Aspiration of Tracheobronchial Tree ([id number])  Indication(s) RML infiltrate  Consent Risks of the procedure as well as the alternatives and risks of each were explained to the patient and/or caregiver.  Consent for the procedure was obtained and is signed in the bedside chart  Anesthesia general   Time Out Verified patient identification, verified procedure, site/side was marked, verified correct patient position, special equipment/implants available, medications/allergies/relevant history reviewed, required imaging and test results available.   Sterile Technique Usual hand hygiene, masks, gowns, and gloves were used   Procedure Description Bronchoscope advanced through endotracheal tube and into airway.  Airways were examined down to subsegmental level with findings noted below.   Following diagnostic evaluation, BAL(s) performed in RML with normal saline and return of 15 mL of fluid  Findings: There is severe tracheobronchomalacia and architectural distortion of the airway beginning at the trachea and extending into the segmental bronchi. There was significant malacia in the bronchus intermedius and into the RML bronchus. The airway mucosa was noted to be irritated and friable. The malacia in the RML was bypassed and evaluated with no stenosis or endobronchial lesions. There was a significant amount of thick secretions that were therapeutically aspirated and cleared out of the airway. A BAL was performed in the RML and sent for culture.   Complications/Tolerance None; patient tolerated the procedure well. Chest X-ray is not needed post procedure.   EBL Minimal   Specimen(s) BAL from RML for culture  Joshua Chute, MD [name] 12/17/2023 2:51 PM

## 2023-12-17 NOTE — H&P (Signed)
 Assessment & Plan:   1. Tracheobronchomalacia (Primary) 2. Right middle lobe syndrome   Patient has a history of smoking with obstructive lung disease and bronchodilator response on pulmonary function testing.  He has had multiple chest CTs for lung cancer screening which I have personally reviewed.  There appears to be atelectasis in the right middle lobe as well as saber-sheath appearing trachea and bronchomalacia extending to the distal airways. The most recent CT scan showing potential luminal compromise in the right middle lobe bronchus which could be secondary to malacia, endobronchial lesion, or stenosis of the right middle lobe.   Patient is presenting today for a fiberoptic evaluation of the airway to establish the diagnosis. Plan for flexible bronchoscopy with BAL and possible endobronchial biopsies/brushes if there is an endobronchial lesion. Risks and benefits explained to patient and his family and they are agreeable to proceed. He is appropriate for the procedure.   [name], MD [facility] 12/17/2023 2:18 PM    End of visit medications:  Meds ordered this encounter  Medications   lactated ringers infusion   chlorhexidine (PERIDEX) 0.12 % solution 15 mL   chlorhexidine (PERIDEX) 0.12 % solution    Hoef[place], [place] D: cabinet override   ipratropium-albuterol (DUONEB) 0.5-2.5 (3) MG/3ML nebulizer solution 3 mL   ipratropium-albuterol (DUONEB) 0.5-2.5 (3) MG/3ML nebulizer solution    Hoefler, [place] D: cabinet override     Current Facility-Administered Medications:    lactated ringers infusion, , Intravenous, Continuous, Shelton Silvas, MD, Last Rate: 10 mL/hr at 12/17/23 1318, Restarted at 12/17/23 1354   Subjective:   PATIENT ID: Joshua Bowman GENDER: male DOB: 04/30/1946, MRN: [id number]  No chief complaint on file.   HPI  Patient is a pleasant 78 year old male with a past medical history of smoking followed by Dr. Sherene Sires for  shortness of breath, cough, and wheeze.  He is maintained on Symbicort and has required a few courses of prednisone for cough and wheezing.  PFTs from May 2024 showed an obstructive defect with significant bronchodilator response.  He presents today for flexible bronchoscopy. He is in his usual state of health. He has a persistent wheeze and cough. No chest pain, increase in cough, increase in sputum production, or hemoptysis reported.   Given his smoking history, he was referred to our lung cancer screening program and underwent low-dose CT scan which showed chronic bronchomalacia with atelectasis in the right middle lobe.  This appears improved on the repeat in January 2025.  This January 2025 CT showed persistent luminal compromise of the right middle lobe bronchus. The CT is also showing signs of tracheobronchomalacia with a saber sheath trachea.   He has a history of smoking, with around 45 pack years of smoking history.  Ancillary information including prior medications, full medical/surgical/family/social histories, and PFTs (when available) are listed below and have been reviewed.   Review of Systems  Constitutional:  Negative for chills and fever.  Respiratory:  Positive for cough, sputum production, shortness of breath and wheezing. Negative for hemoptysis.   Cardiovascular:  Negative for chest pain.     Objective:   Vitals:   12/17/23 1244  BP: 128/89  Pulse: 100  Resp: 18  Temp: 98.6 F (37 C)  TempSrc: Oral  SpO2: 92%  Weight: 67.1 kg  Height: 5\' 6"  (1.676 m)   92% on RA BMI Readings from Last 3 Encounters:  12/17/23 23.89 kg/m  12/14/23 24.21 kg/m  11/18/23 23.97 kg/m   Wt  Readings from Last 3 Encounters:  12/17/23 67.1 kg  12/14/23 68 kg  11/18/23 67.4 kg    Physical Exam Constitutional:      Appearance: Normal appearance.  Cardiovascular:     Rate and Rhythm: Normal rate and regular rhythm.     Pulses: Normal pulses.     Heart sounds: Normal heart  sounds.  Pulmonary:     Effort: Pulmonary effort is normal.     Breath sounds: Wheezing present.  Neurological:     General: No focal deficit present.     Mental Status: He is alert and oriented to person, place, and time. Mental status is at baseline.       Ancillary Information    Past Medical History:  Diagnosis Date   Anxiety    Arthritis    Asthma    Bilateral carotid artery stenosis    Minimal 2012    Colon polyps - attenuated polyposis syndrome    multiple    COPD (chronic obstructive pulmonary disease) (HCC)    Duodenitis    Gastritis    Hemorrhoids    Hyperlipidemia    Hyperplasia of prostate    Hypertension    IBS (irritable bowel syndrome)    Pre-diabetes    Sigmoid diverticulosis    mild      Family History  Problem Relation Age of Onset   Heart disease Mother        Atrial fib   Prostate cancer Brother    Heart disease Brother    Congestive Heart Failure Father    Post-traumatic stress disorder Father    Arthritis Sister        back issues    Heart attack Brother      Past Surgical History:  Procedure Laterality Date   APPENDECTOMY  1969   CATARACT EXTRACTION Right    COLONOSCOPY     ESOPHAGOGASTRODUODENOSCOPY     EYE SURGERY     laser - left eye - Dr Ashley Royalty   lipoma rt shoulder  01/2001   outpatient    nasal surgery ( spurs )  70's   rt. shoulder -rotator cuff repair  08/2005   Dr. Jillyn Hidden    TONSILLECTOMY      Social History   Socioeconomic History   Marital status: Married    Spouse name: Amada Jupiter   Number of children: 1   Years of education: 15   Highest education level: Associate degree: occupational, Scientist, product/process development, or vocational program  Occupational History   Occupation: retired     Comment: country side IT sales professional   Tobacco Use   Smoking status: Every Day    Current packs/day: 1.00    Average packs/day: 1 pack/day for 45.5 years (45.5 ttl pk-yrs)    Types: Cigarettes    Start date: 09/29/1980   Smokeless tobacco:  Former  Building services engineer status: Never Used  Substance and Sexual Activity   Alcohol use: Not Currently   Drug use: No   Sexual activity: Yes  Other Topics Concern   Not on file  Social History Narrative   Retired from a nursing home.  Lives with second wife.    Social Drivers of Corporate investment banker Strain: Low Risk  (03/11/2023)   Overall Financial Resource Strain (CARDIA)    Difficulty of Paying Living Expenses: Not very hard  Food Insecurity: No Food Insecurity (03/11/2023)   Hunger Vital Sign    Worried About Running Out of Food in the Last Year:  Never true    Ran Out of Food in the Last Year: Never true  Transportation Needs: No Transportation Needs (03/11/2023)   PRAPARE - Administrator, Civil Service (Medical): No    Lack of Transportation (Non-Medical): No  Physical Activity: Insufficiently Active (03/11/2023)   Exercise Vital Sign    Days of Exercise per Week: 2 days    Minutes of Exercise per Session: 60 min  Stress: No Stress Concern Present (03/11/2023)   [employer] of Occupational Health - Occupational Stress Questionnaire    Feeling of Stress : Only a little  Social Connections: Moderately Isolated (03/11/2023)   Social Connection and Isolation Panel [NHANES]    Frequency of Communication with Friends and Family: Once a week    Frequency of Social Gatherings with Friends and Family: Once a week    Attends Religious Services: 1 to 4 times per year    Active Member of Clubs or Organizations: No    Attends Banker Meetings: Never    Marital Status: Married  Catering manager Violence: Not At Risk (03/03/2023)   Humiliation, Afraid, Rape, and Kick questionnaire    Fear of Current or Ex-Partner: No    Emotionally Abused: No    Physically Abused: No    Sexually Abused: No     Allergies  Allergen Reactions   Lipitor [Atorvastatin] Other (See Comments)    myalgias   Sulfa Antibiotics Itching and Rash     CBC    Component  Value Date/Time   WBC 8.1 12/17/2023 1311   RBC 5.25 12/17/2023 1311   HGB 17.1 (H) 12/17/2023 1311   HGB 16.8 06/15/2023 1355   HCT 51.4 12/17/2023 1311   HCT 50.6 06/15/2023 1355   PLT 212 12/17/2023 1311   PLT 234 06/15/2023 1355   MCV 97.9 12/17/2023 1311   MCV 97 06/15/2023 1355   MCH 32.6 12/17/2023 1311   MCHC 33.3 12/17/2023 1311   RDW 13.5 12/17/2023 1311   RDW 12.9 06/15/2023 1355   LYMPHSABS 1.7 06/15/2023 1355   EOSABS 0.1 06/15/2023 1355   BASOSABS 0.1 06/15/2023 1355    Pulmonary Functions Testing Results:    Latest Ref Rng & Units 02/03/2023    1:40 PM  PFT Results  FVC-Pre L 3.19   FVC-Predicted Pre % 91   FVC-Post L 3.94   FVC-Predicted Post % 112   Pre FEV1/FVC % % 28   Post FEV1/FCV % % 29   FEV1-Pre L 0.89   FEV1-Predicted Pre % 35   FEV1-Post L 1.13   DLCO uncorrected ml/min/mmHg 13.07   DLCO UNC% % 60   DLVA Predicted % 57   TLC L 10.05   TLC % Predicted % 161   RV % Predicted % 268     @ENCMEDSTART @

## 2023-12-17 NOTE — Discharge Instructions (Addendum)
 We will start a pulmonary clearance regimen that will include the following:  -Start with using the hypertonic saline by nebulizer -Then follow the albuterol by nebulizer -Then use the flutter device: please do at least 10-15 blows into the device for at least 2 to 3 seconds each -And finish up with using the incentive spirometer; use for at least 10 times -After doing these, attempt to cough.  Please do these twice a day. Also attempt to do purse lip breathing as much as possible.  Also recommend the use of CPAP overnight to help overcome the malacia.

## 2023-12-17 NOTE — Anesthesia Postprocedure Evaluation (Signed)
 Anesthesia Post Note  Patient: Joshua Bowman  Procedure(s) Performed: BRONCHOSCOPY, WITH FLUOROSCOPY IRRIGATION, BRONCHUS     Patient location during evaluation: PACU Anesthesia Type: General Level of consciousness: awake and alert Pain management: pain level controlled Vital Signs Assessment: post-procedure vital signs reviewed and stable Respiratory status: spontaneous breathing, nonlabored ventilation, respiratory function stable and patient connected to nasal cannula oxygen Cardiovascular status: blood pressure returned to baseline and stable Postop Assessment: no apparent nausea or vomiting Anesthetic complications: no  No notable events documented.  Last Vitals:  Vitals:   12/17/23 1530 12/17/23 1540  BP: 91/70 115/76  Pulse: 80 78  Resp: 16 15  Temp:    SpO2: 91% 90%    Last Pain:  Vitals:   12/17/23 1540  TempSrc:   PainSc: 0-No pain                 Shelton Silvas

## 2023-12-18 LAB — ACID FAST SMEAR (AFB, MYCOBACTERIA): Acid Fast Smear: NEGATIVE

## 2023-12-20 ENCOUNTER — Encounter (HOSPITAL_COMMUNITY): Payer: Self-pay | Admitting: Student in an Organized Health Care Education/Training Program

## 2023-12-20 LAB — CULTURE, BAL-QUANTITATIVE W GRAM STAIN
Culture: <10000 — AB
Gram Stain: NONE SEEN

## 2023-12-21 LAB — CULTURE, RESPIRATORY W GRAM STAIN
Culture: NORMAL
Gram Stain: NONE SEEN

## 2023-12-22 LAB — AEROBIC/ANAEROBIC CULTURE W GRAM STAIN (SURGICAL/DEEP WOUND)
Culture: NORMAL
Gram Stain: NONE SEEN

## 2023-12-25 ENCOUNTER — Other Ambulatory Visit: Payer: Self-pay | Admitting: Pulmonary Disease

## 2023-12-25 MED ORDER — ALBUTEROL SULFATE 1.25 MG/3ML IN NEBU: 1.0000 | INHALATION_SOLUTION | Freq: Four times a day (QID) | RESPIRATORY_TRACT | Status: DC | PRN

## 2023-12-25 MED ORDER — ALBUTEROL SULFATE HFA 108 (90 BASE) MCG/ACT IN AERS: 2.0000 | INHALATION_SPRAY | Freq: Four times a day (QID) | RESPIRATORY_TRACT | 1 refills | Status: DC | PRN

## 2023-12-25 NOTE — Telephone Encounter (Signed)
 Copied from CRM [id number]. Topic: Clinical - Medication Refill >> Dec 25, 2023  1:09 PM Para March B wrote: Most Recent Primary Care Visit:   Medication:  albuterol (ACCUNEB) 1.25 MG/3ML nebulizer solution albuterol (VENTOLIN HFA) 108 (90 Base) MCG/ACT inhaler   Has the patient contacted their pharmacy? Yes (Agent: If no, request that the patient contact the pharmacy for the refill. If patient does not wish to contact the pharmacy document the reason why and proceed with request.) (Agent: If yes, when and what did the pharmacy advise?)  Is this the correct pharmacy for this prescription? Yes If no, delete pharmacy and type the correct one.  This is the patient's preferred pharmacy:  [facility] [place], [place] - [address] [place] [address] Phone: [phone] Fax: [phone]   Has the prescription been filled recently? No  Is the patient out of the medication? Yes  Has the patient been seen for an appointment in the last year OR does the patient have an upcoming appointment? Yes  Can we respond through MyChart? Yes  Agent: Please be advised that Rx refills may take up to 3 business days. We ask that you follow-up with your pharmacy.

## 2023-12-29 ENCOUNTER — Other Ambulatory Visit: Payer: Self-pay | Admitting: Family Medicine

## 2023-12-29 ENCOUNTER — Telehealth: Payer: Self-pay

## 2023-12-29 DIAGNOSIS — J398 Other specified diseases of upper respiratory tract: Secondary | ICD-10-CM

## 2023-12-29 DIAGNOSIS — J449 Chronic obstructive pulmonary disease, unspecified: Secondary | ICD-10-CM

## 2023-12-29 MED ORDER — ALBUTEROL SULFATE 1.25 MG/3ML IN NEBU: 1.0000 | INHALATION_SOLUTION | Freq: Four times a day (QID) | RESPIRATORY_TRACT | 11 refills | Status: AC | PRN

## 2023-12-29 MED ORDER — SODIUM CHLORIDE 3 % IN NEBU: INHALATION_SOLUTION | Freq: Two times a day (BID) | RESPIRATORY_TRACT | 11 refills | Status: AC

## 2023-12-29 NOTE — Telephone Encounter (Signed)
 I see the sodium chloride HYPERTONIC 3 % nebulizer solution and the albuterol (ACCUNEB) 1.25 MG/3ML nebulizer solution on his chart. The orders were placed on 2 different days. I just want to confirm you want him to have both medications?

## 2023-12-29 NOTE — Telephone Encounter (Signed)
 I have notified the patient. He said his pharmacy did not get the 2 nebulizer medications sent to them. He also said he does not the the incentive spirometer or the flutter device because he already has them. He just needs the nebulizer machine.   I have sent in the nebulizer medications to his pharmacy.  Synetta Fail can you please let Emi Belfast know he does not need the incentive spirometer or the flutter device? Thank you!

## 2023-12-29 NOTE — Telephone Encounter (Signed)
 Copied from CRM [id number]. Topic: Clinical - Medication Question >> Dec 29, 2023 11:34 AM [name] wrote: Reason for CRM: Patient [id number] states Dr. Aundria Rud was suppose to prescribe nebulizer and solution in the after care summary. Patient does not know the name of the medications and it's not at [facility] [place], [place] - [address] [address] Phone:[phone]:[phone]. Patient would like to know what's going on. Please advise and call back.

## 2024-01-07 NOTE — Telephone Encounter (Signed)
 I spoke with [place] with [facility] they have the neb machine order to be processed

## 2024-01-07 NOTE — Telephone Encounter (Signed)
 Nothing further needed

## 2024-01-21 LAB — FUNGUS CULTURE WITH STAIN

## 2024-01-21 LAB — FUNGAL ORGANISM REFLEX

## 2024-01-21 LAB — FUNGUS CULTURE RESULT

## 2024-01-30 LAB — ACID FAST CULTURE WITH REFLEXED SENSITIVITIES (MYCOBACTERIA): Acid Fast Culture: NEGATIVE

## 2024-01-31 NOTE — Progress Notes (Unsigned)
 Joshua Bowman, male    DOB: 05/10/1946    MRN: [id number]   Brief patient profile:  66  yowm  active smoker/MM  referred to pulmonary clinic in [place]  11/11/2022 by Roise Cleaver for LDSCT showing ? 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 with CB symptoms worse x 2022   History of Present Illness  11/11/2022  Pulmonary/ 1st office eval/ Joshua Bowman / [place] [facility]  Chief Complaint  Patient presents with   New Patient (Initial Visit)    Saw SG NP for LCS    Dyspnea:  [employer] walks slower than avg pushes cart / no hc parking Cough: thick white, never bloody p coffee seems worse / rattles at bedtime  Sleep: does not disturb sleep  SABA use: 2-3 x per week  02: none Rec Prednisone  10 mg take  4 each am x 2 days,  2 each am x 2 days,  1 each am x 2 days and stop  Continue Symbicort  160  but you must work on inhaler technique:   For cough > mucinex 1200 mg twice daily with a glass of water  and use the flutter valve as much as your can  The key is to stop smoking completely before smoking completely stops you! Please schedule a follow up office visit in 6 weeks, call sooner if needed with all medications /inhalers/ solutions in hand         07/20/2023  f/u ov/[place] office/Joshua Bowman re: GOLD 3 copd/cb  maint on symbicort    Chief Complaint  Patient presents with   COPD   Dyspnea:  MMRC2 = can't walk a nl pace on a flat grade s sob but does fine slow and flat walking fast or uphills  Cough: comes and goes > min mucoid  Sleeping: bed is flat one side / one pillow s    resp cc  SABA use: avg 3 x weekly  02: none  Rec No change in medications  Please schedule a follow up visit in 6  months but call sooner if needed   FOB 12/17/23 severe tracheobronchomalacia and architectural distortion of the airway beginning at the trachea and extending into the segmental bronchi. There was significant malacia in the bronchus intermedius and into the RML bronchus. The airway mucosa was noted to be  irritated and friable. The malacia in the RML was bypassed and evaluated with no stenosis or endobronchial lesions. There was a significant amount of thick secretions that were therapeutically aspirated and cleared out of the airway. A BAL was performed in the RML and sent for culture: nl flora   Rec Hypertonic Saline and alb qid    02/01/2024  f/u ov/[place] office/Joshua Bowman re: GOLD 3 copd/ cb  maint on symbicort  160 and started  hypertonic saline f/b albuterol   but not sure how often to use it  Chief Complaint  Patient presents with   Shortness of Breath   Cough  Dyspnea:  no change  Cough: 2-3 x per day > creamy  Sleeping: flat / one pillow s   resp cc  SABA use: once or twice  02: none      No obvious day to day or daytime variability or assoc excess/ purulent sputum or mucus plugs or hemoptysis or cp or chest tightness, subjective wheeze or overt sinus or hb symptoms.    Also denies any obvious fluctuation of symptoms with weather or environmental changes or other aggravating or alleviating factors except as outlined above   No unusual  exposure hx or h/o childhood pna/ asthma or knowledge of premature birth.  Current Allergies, Complete Past Medical History, Past Surgical History, Family History, and Social History were reviewed in [employer] record.  ROS  The following are not active complaints unless bolded Hoarseness, sore throat, dysphagia, dental problems, itching, sneezing,  nasal congestion or discharge of excess mucus or purulent secretions, ear ache,   fever, chills, sweats, unintended wt loss or wt gain, classically pleuritic or exertional cp,  orthopnea pnd or arm/hand swelling  or leg swelling, presyncope, palpitations, abdominal pain, anorexia, nausea, vomiting, diarrhea  or change in bowel habits or change in bladder habits, change in stools or change in urine, dysuria, hematuria,  rash, arthralgias, visual complaints, headache, numbness, weakness or  ataxia or problems with walking or coordination,  change in mood or  memory.        No outpatient medications have been marked as taking for the 02/01/24 encounter (Office Visit) with Joshua Formica, MD.                Past Medical History:  Diagnosis Date   Anxiety    Arthritis    Bilateral carotid artery stenosis    Minimal 2012    Colon polyps - attenuated polyposis syndrome    multiple    COPD (chronic obstructive pulmonary disease) (HCC)    Duodenitis    Gastritis    Hemorrhoids    Hyperlipidemia    Hyperplasia of prostate    IBS (irritable bowel syndrome)    Pre-diabetes    Sigmoid diverticulosis    mild         Objective:    Wts  02/01/2024         149  07/20/2023     151   12/25/22 154 lb 12.8 oz (70.2 kg)  12/10/22 154 lb 12.8 oz (70.2 kg)  11/11/22 153 lb 12.8 oz (69.8 kg)    Vital signs reviewed  02/01/2024  - Note at rest 02 sats  90% on RA   General appearance:    amb pleasant elderly wm nad    HEENT :  Oropharynx  clear   Nasal turbinates nl    NECK :  without JVD/Nodes/TM/ nl carotid upstrokes bilaterally   LUNGS: no acc muscle use,  Mod barrel  contour chest wall with bilateral  Distant insp/exp rhonchi and  without cough on insp or exp maneuvers and mod  Hyperresonant  to  percussion bilaterally     CV:  RRR  no s3 or murmur or increase in P2, and no edema   ABD:  soft and nontender with pos mid insp Hoover's  in the supine position. No bruits or organomegaly appreciated, bowel sounds nl  MS:   Ext warm without deformities or   obvious joint restrictions , calf tenderness, cyanosis or clubbing  SKIN: warm and dry without lesions    NEURO:  alert, approp, nl sensorium with  no motor or cerebellar deficits apparent.             Assessment

## 2024-02-01 ENCOUNTER — Encounter: Payer: Self-pay | Admitting: Internal Medicine

## 2024-02-01 ENCOUNTER — Ambulatory Visit: Admitting: Internal Medicine

## 2024-02-01 VITALS — BP 115/69 | HR 85 | Ht 66.0 in | Wt 149.2 lb

## 2024-02-01 DIAGNOSIS — J439 Emphysema, unspecified: Secondary | ICD-10-CM | POA: Diagnosis not present

## 2024-02-01 DIAGNOSIS — F17210 Nicotine dependence, cigarettes, uncomplicated: Secondary | ICD-10-CM | POA: Diagnosis not present

## 2024-02-01 DIAGNOSIS — J449 Chronic obstructive pulmonary disease, unspecified: Secondary | ICD-10-CM

## 2024-02-01 NOTE — Patient Instructions (Addendum)
 Symbicort  160 Take 2 puffs first thing in am and then another 2 puffs about 12 hours later.    As needed for cough or breathing  >  try albuterol  inhaler 1st x 2 puffs up to every 4 hours   If cough/ congested > saline followed by the nebulizer up to every 4 hours   Also  Ok to try albuterol  15 min before an activity (on alternating days with inhaler then nebulizer )  that you know would usually make you short of breath and see if it makes any difference and if makes none then don't take albuterol  after activity unless you can't catch your breath as this means it's the resting that helps, not the albuterol .   Please schedule a follow up visit in 6 months but call sooner if needed  with all inhalers/ solutions in hand so we can verify exactly what you are taking.

## 2024-02-02 NOTE — Assessment & Plan Note (Signed)
 4-5 min discussion re active cigarette smoking in addition to office E&M  Ask about tobacco use:   ongoing Advise quitting   advised of effects of cigarettes on mc function which are the direct cause of the problems of retained secretions and cessation =  the most important aspect of his treatment plan Assess willingness:  Not committed at this point Assist in quit attempt:  Per PCP when ready Arrange follow up:   Follow up per Primary Care planned

## 2024-02-02 NOTE — Assessment & Plan Note (Addendum)
 Active smoker/MM - Labs ordered 11/11/2022  :  allergy screen Eos 0.1  alpha one AT phenotype MM level 171  - 11/11/2022  After extensive coaching inhaler device,  effectiveness =    75% from baseline 25% > continue symbicort  160 2bid/ mucinex/ flutter valve ordered   - 11/11/2022   Walked on RA  x  3  lap(s) =  approx 450  ft  @ slow pace, stopped due to end of study with lowest 02 sats 91% and mild sob   - PFT's  02/03/23  FEV1 1.13  (45 % ) ratio 0.29   p 26 % improvement from saba p ?symb?  prior to study with DLCO  13 (60%%)   and FV curve classic curvature    - CT chest 12/16/22 mild emphysema  - 12/17/2023  FOB c/w severe trachomalacia rx hypertonic saline f/b albuterol  up to qid prn  - 02/01/2024  After extensive coaching inhaler device,  effectiveness =    80%    Group D (now reclassified as E) in terms of symptom/risk and laba/lama/ICS  therefore appropriate rx at this point >>>  continue symbicort  160 for now along with prn Hypertonic saline/ alb qid prn / flutter valve with max mucinex- can also use albuterol  hfa prn as follows:  Re SABA :  I spent extra time with pt today reviewing appropriate use of albuterol  for prn use on exertion with the following points: 1) saba is for relief of sob that does not improve by walking a slower pace or resting but rather if the pt does not improve after trying this first. 2) If the pt is convinced, as many are, that saba helps recover from activity faster then it's easy to tell if this is the case by re-challenging : ie stop, take the inhaler, then p 5 minutes try the exact same activity (intensity of workload) that just caused the symptoms and see if they are substantially diminished or not after saba 3) if there is an activity that reproducibly causes the symptoms, try the saba 15 min before the activity on alternate days   If in fact the saba really does help, then fine to continue to use it prn but advised may need to look closer at the maintenance regimen  (? Add lama if not already tried) being used to achieve better control of airways disease with exertion.          Each maintenance medication was reviewed in detail including emphasizing most importantly the difference between maintenance and prns and under what circumstances the prns are to be triggered using an action plan format where appropriate.  Total time for H and P, chart review, counseling, reviewing hfa/flutter/ neb  device(s) and generating customized AVS unique to this office visit / same day charting = 30 min

## 2024-03-03 ENCOUNTER — Ambulatory Visit: Payer: Medicare Other

## 2024-03-03 VITALS — BP 115/69 | HR 85 | Ht 66.0 in | Wt 149.0 lb

## 2024-03-03 DIAGNOSIS — Z0000 Encounter for general adult medical examination without abnormal findings: Secondary | ICD-10-CM

## 2024-03-03 NOTE — Progress Notes (Signed)
 Subjective:   Joshua Bowman is a 78 y.o. who presents for a Medicare Wellness preventive visit.  As a reminder, Annual Wellness Visits don't include a physical exam, and some assessments may be limited, especially if this visit is performed virtually. We may recommend an in-person follow-up visit with your provider if needed.  Visit Complete: Virtual I connected with  Stewart Elk on 03/03/24 by a audio enabled telemedicine application and verified that I am speaking with the correct person using two identifiers.  Patient Location: Home  Provider Location: Home Office  I discussed the limitations of evaluation and management by telemedicine. The patient expressed understanding and agreed to proceed.  Vital Signs: Because this visit was a virtual/telehealth visit, some criteria may be missing or patient reported. Any vitals not documented were not able to be obtained and vitals that have been documented are patient reported.  VideoDeclined- This patient declined Librarian, academic. Therefore the visit was completed with audio only.  Persons Participating in Visit: Patient.  AWV Questionnaire: No: Patient Medicare AWV questionnaire was not completed prior to this visit.  Cardiac Risk Factors include: advanced age (>35men, >53 women);dyslipidemia;hypertension;male gender     Objective:     Today's Vitals   03/03/24 1222  BP: 115/69  Pulse: 85  Weight: 149 lb (67.6 kg)  Height: 5\' 6"  (1.676 m)   Body mass index is 24.05 kg/m.     03/03/2024   12:28 PM 12/17/2023    1:16 PM 06/26/2023   10:37 AM 03/03/2023    1:38 PM 02/27/2022    1:24 PM 10/30/2021    2:39 PM 02/26/2021    2:56 PM  Advanced Directives  Does Patient Have a Medical Advance Directive? No No No No No No No  Would patient like information on creating a medical advance directive?  No - Patient declined No - Patient declined No - Patient declined No - Patient declined  Yes  (MAU/Ambulatory/Procedural Areas - Information given)    Current Medications (verified) Outpatient Encounter Medications as of 03/03/2024  Medication Sig   albuterol  (ACCUNEB ) 1.25 MG/3ML nebulizer solution Take 3 mLs (1.25 mg total) by nebulization every 6 (six) hours as needed for wheezing.   albuterol  (VENTOLIN  HFA) 108 (90 Base) MCG/ACT inhaler Inhale 2 puffs into the lungs every 6 (six) hours as needed for wheezing or shortness of breath.   aspirin 325 MG tablet Take 325 mg by mouth 2 (two) times a week.   budesonide -formoterol  (SYMBICORT ) 160-4.5 MCG/ACT inhaler INHALE TWO PUFFS TWICE DAILY   Cholecalciferol (VITAMIN D3) 5000 UNITS CAPS Take 1 tablet by mouth. 2-3 x week   diclofenac  sodium (VOLTAREN ) 1 % GEL Apply 4 g topically 2 (two) times daily. (Patient taking differently: Apply 4 g topically as needed.)   DULoxetine  (CYMBALTA ) 60 MG capsule Take 1 capsule (60 mg total) by mouth 2 (two) times daily.   finasteride  (PROSCAR ) 5 MG tablet Take 1 tablet (5 mg total) by mouth daily. For urine flow   HYDROcodone -acetaminophen  (NORCO/VICODIN) 5-325 MG tablet Take 0.5 tablets by mouth every 6 (six) hours as needed for moderate pain (pain score 4-6).   HYDROcodone -acetaminophen  (NORCO/VICODIN) 5-325 MG tablet Take 0.5 tablets by mouth every 6 (six) hours as needed for moderate pain (pain score 4-6).   HYDROcodone -acetaminophen  (NORCO/VICODIN) 5-325 MG tablet Take 0.5 tablets by mouth every 6 (six) hours as needed for moderate pain (pain score 4-6).   nicotine  (NICODERM CQ  - DOSED IN MG/24 HOURS) 14 mg/24hr  patch Place 1 patch (14 mg total) onto the skin daily.   rosuvastatin  (CRESTOR ) 10 MG tablet Take 1 tablet (10 mg total) by mouth daily.   tamsulosin  (FLOMAX ) 0.4 MG CAPS capsule Take 2 capsules (0.8 mg total) by mouth at bedtime. For urine flow and prostate   No facility-administered encounter medications on file as of 03/03/2024.    Allergies (verified) Lipitor [atorvastatin] and Sulfa  antibiotics   History: Past Medical History:  Diagnosis Date   Anxiety    Arthritis    Asthma    Bilateral carotid artery stenosis    Minimal 2012    Colon polyps - attenuated polyposis syndrome    multiple    COPD (chronic obstructive pulmonary disease) (HCC)    Duodenitis    Gastritis    Hemorrhoids    Hyperlipidemia    Hyperplasia of prostate    Hypertension    IBS (irritable bowel syndrome)    Pre-diabetes    Sigmoid diverticulosis    mild    Past Surgical History:  Procedure Laterality Date   APPENDECTOMY  1969   BRONCHIAL WASHINGS  12/17/2023   Procedure: IRRIGATION, BRONCHUS;  Surgeon: Vergia Glasgow, MD;  Location: [facility];  Service: Pulmonary;;   CATARACT EXTRACTION Right    COLONOSCOPY     ESOPHAGOGASTRODUODENOSCOPY     EYE SURGERY     laser - left eye - Dr Augustus Ledger   lipoma rt shoulder  01/2001   outpatient    nasal surgery ( spurs )  70's   rt. shoulder -rotator cuff repair  08/2005   Dr. Mildred All    TONSILLECTOMY     VIDEO BRONCHOSCOPY N/A 12/17/2023   Procedure: BRONCHOSCOPY, WITH FLUOROSCOPY;  Surgeon: Vergia Glasgow, MD;  Location: [facility];  Service: Pulmonary;  Laterality: N/A;   Family History  Problem Relation Age of Onset   Heart disease Mother        Atrial fib   Prostate cancer Brother    Heart disease Brother    Congestive Heart Failure Father    Post-traumatic stress disorder Father    Arthritis Sister        back issues    Heart attack Brother    Social History   Socioeconomic History   Marital status: Married    Spouse name: Eddie Good   Number of children: 1   Years of education: 15   Highest education level: Associate degree: occupational, Scientist, product/process development, or vocational program  Occupational History   Occupation: retired     Comment: country side IT sales professional   Tobacco Use   Smoking status: Every Day    Current packs/day: 1.00    Average packs/day: 1 pack/day for 45.7 years (45.7 ttl pk-yrs)    Types: Cigarettes     Start date: 09/29/1980   Smokeless tobacco: Former  Building services engineer status: Never Used  Substance and Sexual Activity   Alcohol use: Not Currently   Drug use: No   Sexual activity: Yes  Other Topics Concern   Not on file  Social History Narrative   Retired from a nursing home.  Lives with second wife.    Social Drivers of Corporate investment banker Strain: Low Risk  (03/03/2024)   Overall Financial Resource Strain (CARDIA)    Difficulty of Paying Living Expenses: Not hard at all  Food Insecurity: No Food Insecurity (03/03/2024)   Hunger Vital Sign    Worried About Running Out of Food in the Last Year: Never true  Ran Out of Food in the Last Year: Never true  Transportation Needs: No Transportation Needs (03/03/2024)   PRAPARE - Administrator, Civil Service (Medical): No    Lack of Transportation (Non-Medical): No  Physical Activity: Insufficiently Active (03/03/2024)   Exercise Vital Sign    Days of Exercise per Week: 3 days    Minutes of Exercise per Session: 40 min  Stress: No Stress Concern Present (03/03/2024)   [employer] of Occupational Health - Occupational Stress Questionnaire    Feeling of Stress : Not at all  Social Connections: Moderately Isolated (03/03/2024)   Social Connection and Isolation Panel [NHANES]    Frequency of Communication with Friends and Family: Twice a week    Frequency of Social Gatherings with Friends and Family: Twice a week    Attends Religious Services: Never    Database administrator or Organizations: No    Attends Engineer, structural: Never    Marital Status: Married    Tobacco Counseling Ready to quit: No Counseling given: Yes    Clinical Intake:  Pre-visit preparation completed: Yes  Pain : No/denies pain     BMI - recorded: 2424.05 Nutritional Status: BMI of 19-24  Normal Nutritional Risks: None Diabetes: No  No results found for: "HGBA1C"   How often do you need to have someone help you when  you read instructions, pamphlets, or other written materials from your doctor or pharmacy?: 1 - Never  Interpreter Needed?: No  Information entered by :: Alia T/cma   Activities of Daily Living     03/03/2024   12:24 PM 06/26/2023   10:39 AM  In your present state of health, do you have any difficulty performing the following activities:  Hearing? 0   Vision? 0   Comment pt wear glasses/pt goes to [facility] [address] in [facility] last appt w/29mos ago   Difficulty concentrating or making decisions? 0   Walking or climbing stairs? 1   Comment knee issues   Dressing or bathing? 0   Doing errands, shopping? 0 0  Preparing Food and eating ? N   Using the Toilet? N   In the past six months, have you accidently leaked urine? N   Do you have problems with loss of bowel control? N   Managing your Medications? N   Managing your Finances? N   Housekeeping or managing your Housekeeping? N     Patient Care Team: Roise Cleaver, MD as PCP - General (Family Medicine) Eilleen Grates, MD as Consulting Physician (Cardiology) Orvan Blanch, MD as Consulting Physician (Orthopedic Surgery) Kenney Peacemaker, MD as Consulting Physician (Gastroenterology) Hyland Mailman, MD as Referring Physician (Optometry) Rexene Catching, MD as Consulting Physician (Ophthalmology) Diamond Formica, MD as Consulting Physician (Pulmonary Disease)  I have updated your Care Teams any recent Medical Services you may have received from other providers in the past year.     Assessment:    This is a routine wellness examination for Elhadji.  Hearing/Vision screen Hearing Screening - Comments:: Pt denies hearing dif Vision Screening - Comments:: pt wear glasses/pt goes to [facility] [address] in [facility] last appt w/46mos ago   Goals Addressed             This Visit's Progress    Patient Stated       Try to quit smoking       Depression Screen     03/03/2024   12:31 PM 12/14/2023  1:00 PM 09/14/2023    1:07 PM  09/14/2023   12:55 PM 06/15/2023    1:27 PM 06/15/2023    1:20 PM 03/12/2023   12:52 PM  PHQ 2/9 Scores  PHQ - 2 Score 0 0 0 0 0 0 0  PHQ- 9 Score 1 1   1       Fall Risk     03/03/2024   12:28 PM 09/14/2023    1:07 PM 09/14/2023   12:55 PM 06/15/2023    1:20 PM 03/12/2023   12:52 PM  Fall Risk   Falls in the past year? 0 0 0 0 0  Number falls in past yr: 0      Injury with Fall? 0      Risk for fall due to : No Fall Risks      Follow up Falls evaluation completed        MEDICARE RISK AT HOME:  Medicare Risk at Home Any stairs in or around the home?: No If so, are there any without handrails?: No Home free of loose throw rugs in walkways, pet beds, electrical cords, etc?: Yes Adequate lighting in your home to reduce risk of falls?: Yes Life alert?: No Use of a cane, walker or w/c?: No Grab bars in the bathroom?: Yes Shower chair or bench in shower?: Yes Elevated toilet seat or a handicapped toilet?: Yes  TIMED UP AND GO:  Was the test performed?  no  Cognitive Function: 6CIT completed        03/03/2024   12:33 PM 03/03/2023    1:39 PM 02/27/2022    1:24 PM 02/23/2020    2:57 PM 02/16/2019    3:48 PM  6CIT Screen  What Year? 0 points 0 points 0 points 0 points 0 points  What month? 0 points 0 points 0 points 0 points 0 points  What time? 0 points 0 points 0 points 0 points 0 points  Count back from 20 0 points 0 points 0 points 0 points 0 points  Months in reverse 0 points 0 points 0 points 0 points 0 points  Repeat phrase 0 points 0 points 0 points 0 points 0 points  Total Score 0 points 0 points 0 points 0 points 0 points    Immunizations Immunization History  Administered Date(s) Administered   Fluad Quad(high Dose 65+) 07/20/2019, 06/12/2020, 09/09/2021, 09/09/2022   Fluad Trivalent(High Dose 65+) 06/15/2023   Influenza Whole 06/20/2015   Influenza, High Dose Seasonal PF 06/20/2016, 07/22/2018   Influenza,inj,Quad PF,6+ Mos 08/09/2014, 08/04/2017   Moderna  Sars-Covid-2 Vaccination 11/23/2019, 12/21/2019, 10/09/2020, 02/19/2021   Pneumococcal Conjugate-13 12/06/2013   Pneumococcal Polysaccharide-23 11/19/2011   Td 12/03/2017    Screening Tests Health Maintenance  Topic Date Due   Zoster Vaccines- Shingrix (1 of 2) 03/15/2024 (Originally 12/23/1964)   Colonoscopy  12/13/2024 (Originally 11/19/2018)   COVID-19 Vaccine (5 - 2024-25 season) 03/19/2025 (Originally 05/31/2023)   INFLUENZA VACCINE  04/29/2024   Lung Cancer Screening  10/19/2024   Medicare Annual Wellness (AWV)  03/03/2025   DTaP/Tdap/Td (2 - Tdap) 12/04/2027   Pneumonia Vaccine 36+ Years old  Completed   Hepatitis C Screening  Completed   HPV VACCINES  Aged Out   Meningococcal B Vaccine  Aged Out    Health Maintenance  There are no preventive care reminders to display for this patient.  Health Maintenance Items Addressed: See Nurse Notes at the end of this note  Additional Screening:  Vision Screening: Recommended annual ophthalmology exams  for early detection of glaucoma and other disorders of the eye. Would you like a referral to an eye doctor? No    Dental Screening: Recommended annual dental exams for proper oral hygiene  Community Resource Referral / Chronic Care Management: CRR required this visit?  No   CCM required this visit?  No   Plan:    I have personally reviewed and noted the following in the patient's chart:   Medical and social history Use of alcohol, tobacco or illicit drugs  Current medications and supplements including opioid prescriptions. Patient is not currently taking opioid prescriptions. Functional ability and status Nutritional status Physical activity Advanced directives List of other physicians Hospitalizations, surgeries, and ER visits in previous 12 months Vitals Screenings to include cognitive, depression, and falls Referrals and appointments  In addition, I have reviewed and discussed with patient certain preventive  protocols, quality metrics, and best practice recommendations. A written personalized care plan for preventive services as well as general preventive health recommendations were provided to patient.   Michaelle Adolphus, CMA   03/03/2024   After Visit Summary: (MyChart) Due to this being a telephonic visit, the after visit summary with patients personalized plan was offered to patient via MyChart   Notes: Nothing significant to report at this time.

## 2024-03-03 NOTE — Patient Instructions (Signed)
 Joshua Bowman , Thank you for taking time out of your busy schedule to complete your Annual Wellness Visit with me. I enjoyed our conversation and look forward to speaking with you again next year. I, as well as your care team,  appreciate your ongoing commitment to your health goals. Please review the following plan we discussed and let me know if I can assist you in the future. Your Game plan/ To Do List    Follow up Visits: Next Medicare AWV with our clinical staff: 03/06/24 at 10:00a.m   Next Office Visit with your provider: 03/17/11:55p.m.  Clinician Recommendations:  Aim for 30 minutes of exercise or brisk walking, 6-8 glasses of water , and 5 servings of fruits and vegetables each day.       This is a list of the screening recommended for you and due dates:  Health Maintenance  Topic Date Due   Zoster (Shingles) Vaccine (1 of 2) 03/15/2024*   Colon Cancer Screening  12/13/2024*   COVID-19 Vaccine (5 - 2024-25 season) 03/19/2025*   Flu Shot  04/29/2024   Screening for Lung Cancer  10/19/2024   Medicare Annual Wellness Visit  03/03/2025   DTaP/Tdap/Td vaccine (2 - Tdap) 12/04/2027   Pneumonia Vaccine  Completed   Hepatitis C Screening  Completed   HPV Vaccine  Aged Out   Meningitis B Vaccine  Aged Out  *Topic was postponed. The date shown is not the original due date.    Advanced directives: (Declined) Advance directive discussed with you today. Even though you declined this today, please call our office should you change your mind, and we can give you the proper paperwork for you to fill out. Advance Care Planning is important because it:  [x]  Makes sure you receive the medical care that is consistent with your values, goals, and preferences  [x]  It provides guidance to your family and loved ones and reduces their decisional burden about whether or not they are making the right decisions based on your wishes.  Follow the link provided in your after visit summary or read over the  paperwork we have mailed to you to help you started getting your Advance Directives in place. If you need assistance in completing these, please reach out to us  so that we can help you!  See attachments for Preventive Care and Fall Prevention Tips.

## 2024-03-16 ENCOUNTER — Ambulatory Visit: Admitting: Family Medicine

## 2024-03-16 ENCOUNTER — Telehealth: Payer: Self-pay

## 2024-03-16 ENCOUNTER — Other Ambulatory Visit: Payer: Self-pay | Admitting: Family Medicine

## 2024-03-16 DIAGNOSIS — Z79899 Other long term (current) drug therapy: Secondary | ICD-10-CM

## 2024-03-16 DIAGNOSIS — M5126 Other intervertebral disc displacement, lumbar region: Secondary | ICD-10-CM

## 2024-03-16 NOTE — Telephone Encounter (Unsigned)
 Copied from CRM [id number]. Topic: Clinical - Medication Refill >> Mar 16, 2024  1:43 PM Adaline Holly wrote: Medication: HYDROcodone -acetaminophen  (NORCO/VICODIN) 5-325 MG tablet  Has the patient contacted their pharmacy? Yes (Agent: If no, request that the patient contact the pharmacy for the refill. If patient does not wish to contact the pharmacy document the reason why and proceed with request.) (Agent: If yes, when and what did the pharmacy advise?)  This is the patient's preferred pharmacy:  [facility] [place], [place] - [address] [place] [address] Phone: [phone] Fax: [phone]  Is this the correct pharmacy for this prescription? Yes If no, delete pharmacy and type the correct one.   Has the prescription been filled recently? Yes  Is the patient out of the medication? Yes  Has the patient been seen for an appointment in the last year OR does the patient have an upcoming appointment? Yes  Can we respond through MyChart? No  Agent: Please be advised that Rx refills may take up to 3 business days. We ask that you follow-up with your pharmacy.

## 2024-03-16 NOTE — Telephone Encounter (Signed)
 Pt had appointment today and is due for Hydrocodone  refill. Wants to know if you can refill until his rescheduled apt on Wednesday 25th. Covering provider unable to do so. Please advise. LS

## 2024-03-16 NOTE — Telephone Encounter (Signed)
 Informed pt this is a controlled medication that has to be filled during a visit and will be done next Wed 6/25. Pt had an appt today which was rescheduled to next week and he runs out today Please advise on enough till his appt

## 2024-03-17 ENCOUNTER — Other Ambulatory Visit: Payer: Self-pay | Admitting: Family Medicine

## 2024-03-17 DIAGNOSIS — Z79899 Other long term (current) drug therapy: Secondary | ICD-10-CM

## 2024-03-17 DIAGNOSIS — M5126 Other intervertebral disc displacement, lumbar region: Secondary | ICD-10-CM

## 2024-03-17 MED ORDER — HYDROCODONE-ACETAMINOPHEN 5-325 MG PO TABS: 0.5000 | ORAL_TABLET | Freq: Four times a day (QID) | ORAL | 0 refills | Status: DC | PRN

## 2024-03-17 NOTE — Telephone Encounter (Signed)
 Left detailed message per signed DPR. Encouraged call back or send us  a Mychart message if there are any questions.

## 2024-03-17 NOTE — Telephone Encounter (Signed)
 I sent a wwk supply of his pain med to [facility]

## 2024-03-23 ENCOUNTER — Encounter: Payer: Self-pay | Admitting: Family Medicine

## 2024-03-23 ENCOUNTER — Ambulatory Visit: Admitting: Family Medicine

## 2024-03-23 VITALS — BP 117/61 | HR 93 | Temp 98.0°F | Ht 66.0 in | Wt 145.0 lb

## 2024-03-23 DIAGNOSIS — Z79899 Other long term (current) drug therapy: Secondary | ICD-10-CM | POA: Diagnosis not present

## 2024-03-23 DIAGNOSIS — F17210 Nicotine dependence, cigarettes, uncomplicated: Secondary | ICD-10-CM | POA: Diagnosis not present

## 2024-03-23 DIAGNOSIS — M5126 Other intervertebral disc displacement, lumbar region: Secondary | ICD-10-CM | POA: Diagnosis not present

## 2024-03-23 DIAGNOSIS — J449 Chronic obstructive pulmonary disease, unspecified: Secondary | ICD-10-CM

## 2024-03-23 MED ORDER — HYDROCODONE-ACETAMINOPHEN 5-325 MG PO TABS
0.5000 | ORAL_TABLET | Freq: Four times a day (QID) | ORAL | 0 refills | Status: DC | PRN
Start: 2024-03-23 — End: 2024-06-23

## 2024-03-23 MED ORDER — HYDROCODONE-ACETAMINOPHEN 5-325 MG PO TABS
0.5000 | ORAL_TABLET | Freq: Four times a day (QID) | ORAL | 0 refills | Status: DC | PRN
Start: 2024-04-22 — End: 2024-06-23

## 2024-03-23 MED ORDER — HYDROCODONE-ACETAMINOPHEN 5-325 MG PO TABS: 0.5000 | ORAL_TABLET | Freq: Four times a day (QID) | ORAL | 0 refills | Status: DC | PRN

## 2024-03-23 MED ORDER — ALBUTEROL SULFATE HFA 108 (90 BASE) MCG/ACT IN AERS: 2.0000 | INHALATION_SPRAY | Freq: Four times a day (QID) | RESPIRATORY_TRACT | 5 refills | Status: AC | PRN

## 2024-03-23 MED ORDER — BUDESONIDE-FORMOTEROL FUMARATE 160-4.5 MCG/ACT IN AERO: 2.0000 | INHALATION_SPRAY | Freq: Two times a day (BID) | RESPIRATORY_TRACT | 11 refills | Status: AC

## 2024-03-23 NOTE — Progress Notes (Signed)
 Subjective:  Patient ID: Joshua Bowman, male    DOB: June 26, 1946  Age: 78 y.o. MRN: [id number]  CC: Follow-up   HPI Joshua Bowman presents for pain is stable. Too hot to get outside. COPD stable as long as he stays in during the hot part of the day.   Hasn't tried the nicoderm yet. Trying to taper, but not successful so far.   in for follow-up of elevated cholesterol. Doing well without complaints on current medication. Denies side effects of statin including myalgia and arthralgia and nausea. Currently no chest pain, shortness of breath or other cardiovascular related symptoms noted.  Pain is 3-4/10 baseline at lumbar back and right knee. Can flare to 8/10 with activity. Standing in hot shower, sitting on heating pad help.      03/03/2024   12:31 PM 12/14/2023    1:00 PM 09/14/2023    1:07 PM  Depression screen PHQ 2/9  Decreased Interest 0 0 0  Down, Depressed, Hopeless 0 0 0  PHQ - 2 Score 0 0 0  Altered sleeping 0 0   Tired, decreased energy 1 1   Change in appetite 0 0   Feeling bad or failure about yourself  0 0   Trouble concentrating 0 0   Moving slowly or fidgety/restless 0 0   Suicidal thoughts 0 0   PHQ-9 Score 1 1   Difficult doing work/chores Not difficult at all      History Joshua Bowman has a past medical history of Anxiety, Arthritis, Asthma, Bilateral carotid artery stenosis, Colon polyps - attenuated polyposis syndrome, COPD (chronic obstructive pulmonary disease) (HCC), Duodenitis, Gastritis, Hemorrhoids, Hyperlipidemia, Hyperplasia of prostate, Hypertension, IBS (irritable bowel syndrome), Pre-diabetes, and Sigmoid diverticulosis.   He has a past surgical history that includes Appendectomy (1969); nasal surgery ( spurs ) (70's); lipoma rt shoulder (01/2001); rt. shoulder -rotator cuff repair (08/2005); Colonoscopy; Esophagogastroduodenoscopy; Tonsillectomy; Eye surgery; Cataract extraction (Right); Video bronchoscopy (N/A, 12/17/2023); and Bronchial washings  (12/17/2023).   His family history includes Arthritis in his sister; Congestive Heart Failure in his father; Heart attack in his brother; Heart disease in his brother and mother; Post-traumatic stress disorder in his father; Prostate cancer in his brother.He reports that he has been smoking cigarettes. He started smoking about 43 years ago. He has a 45.8 pack-year smoking history. He has quit using smokeless tobacco. He reports that he does not currently use alcohol. He reports that he does not use drugs.    ROS Review of Systems  Constitutional:  Negative for fever.  Respiratory:  Negative for shortness of breath.   Cardiovascular:  Negative for chest pain.  Musculoskeletal:  Negative for arthralgias.  Skin:  Negative for rash.    Objective:  BP 117/61   Pulse 93   Temp 98 F (36.7 C)   Ht 5' 6 (1.676 m)   Wt 145 lb (65.8 kg)   SpO2 98%   BMI 23.40 kg/m   BP Readings from Last 3 Encounters:  03/23/24 117/61  03/03/24 115/69  02/01/24 115/69    Wt Readings from Last 3 Encounters:  03/23/24 145 lb (65.8 kg)  03/03/24 149 lb (67.6 kg)  02/01/24 149 lb 3.2 oz (67.7 kg)     Physical Exam Vitals reviewed.  Constitutional:      Appearance: He is well-developed.  HENT:     Head: Normocephalic and atraumatic.     Right Ear: External ear normal.     Left Ear: External ear normal.  Mouth/Throat:     Pharynx: No oropharyngeal exudate or posterior oropharyngeal erythema.   Eyes:     Pupils: Pupils are equal, round, and reactive to light.    Cardiovascular:     Rate and Rhythm: Normal rate and regular rhythm.     Heart sounds: No murmur heard. Pulmonary:     Effort: No respiratory distress.     Breath sounds: Normal breath sounds.   Musculoskeletal:     Cervical back: Normal range of motion and neck supple.   Neurological:     Mental Status: He is alert and oriented to person, place, and time.      Assessment & Plan:  Cigarette smoker  Lumbar herniated  disc -     HYDROcodone -Acetaminophen ; Take 0.5 tablets by mouth every 6 (six) hours as needed for moderate pain (pain score 4-6).  Dispense: 60 tablet; Refill: 0 -     HYDROcodone -Acetaminophen ; Take 0.5 tablets by mouth every 6 (six) hours as needed for moderate pain (pain score 4-6).  Dispense: 60 tablet; Refill: 0 -     HYDROcodone -Acetaminophen ; Take 0.5 tablets by mouth every 6 (six) hours as needed for moderate pain (pain score 4-6).  Dispense: 60 tablet; Refill: 0  Controlled substance agreement signed -     HYDROcodone -Acetaminophen ; Take 0.5 tablets by mouth every 6 (six) hours as needed for moderate pain (pain score 4-6).  Dispense: 60 tablet; Refill: 0 -     HYDROcodone -Acetaminophen ; Take 0.5 tablets by mouth every 6 (six) hours as needed for moderate pain (pain score 4-6).  Dispense: 60 tablet; Refill: 0 -     HYDROcodone -Acetaminophen ; Take 0.5 tablets by mouth every 6 (six) hours as needed for moderate pain (pain score 4-6).  Dispense: 60 tablet; Refill: 0  COPD GOLD 3  with CB features  Other orders -     Albuterol  Sulfate HFA; Inhale 2 puffs into the lungs every 6 (six) hours as needed for wheezing or shortness of breath.  Dispense: 18 g; Refill: 5 -     Budesonide -Formoterol  Fumarate; Inhale 2 puffs into the lungs 2 (two) times daily.  Dispense: 10.2 g; Refill: 11     Follow-up: Return in about 3 months (around 06/23/2024) for Pain, COPD.  Butler Der, M.D.

## 2024-06-23 ENCOUNTER — Ambulatory Visit: Admitting: Family Medicine

## 2024-06-23 ENCOUNTER — Encounter: Payer: Self-pay | Admitting: Family Medicine

## 2024-06-23 VITALS — BP 98/66 | HR 85 | Temp 98.3°F | Ht 66.0 in | Wt 143.0 lb

## 2024-06-23 DIAGNOSIS — Z79899 Other long term (current) drug therapy: Secondary | ICD-10-CM | POA: Diagnosis not present

## 2024-06-23 DIAGNOSIS — J449 Chronic obstructive pulmonary disease, unspecified: Secondary | ICD-10-CM

## 2024-06-23 DIAGNOSIS — M5126 Other intervertebral disc displacement, lumbar region: Secondary | ICD-10-CM | POA: Diagnosis not present

## 2024-06-23 DIAGNOSIS — Z23 Encounter for immunization: Secondary | ICD-10-CM | POA: Diagnosis not present

## 2024-06-23 LAB — LIPID PANEL

## 2024-06-23 MED ORDER — HYDROCODONE-ACETAMINOPHEN 5-325 MG PO TABS
0.5000 | ORAL_TABLET | Freq: Four times a day (QID) | ORAL | 0 refills | Status: AC | PRN
Start: 2024-07-23 — End: ?

## 2024-06-23 MED ORDER — HYDROCODONE-ACETAMINOPHEN 5-325 MG PO TABS
0.5000 | ORAL_TABLET | Freq: Four times a day (QID) | ORAL | 0 refills | Status: AC | PRN
Start: 2024-08-22 — End: ?

## 2024-06-23 MED ORDER — SPIRIVA RESPIMAT 2.5 MCG/ACT IN AERS: 1.0000 | INHALATION_SPRAY | Freq: Every day | RESPIRATORY_TRACT | 11 refills | Status: AC

## 2024-06-23 MED ORDER — HYDROCODONE-ACETAMINOPHEN 5-325 MG PO TABS
0.5000 | ORAL_TABLET | Freq: Four times a day (QID) | ORAL | 0 refills | Status: AC | PRN
Start: 2024-06-23 — End: ?

## 2024-06-23 NOTE — Progress Notes (Signed)
 Subjective:  Patient ID: Joshua Bowman, male    DOB: 1945/10/08  Age: 78 y.o. MRN: [id number]  CC: Medical Management of Chronic Issues   HPI  Discussed the use of AI scribe software for clinical note transcription with the patient, who gave verbal consent to proceed.  History of Present Illness Joshua Bowman is a 78 year old male with chronic back pain and COPD who presents for follow-up on his chronic conditions.  He experiences chronic back pain, which worsens after physical activities like weed eating. The pain reached a level of 8 out of 10 on Tuesday night but decreased to 4 or 5 out of 10 by Wednesday evening after resting. He finds relief with Vicodin (hydrocodone  5 mg with acetaminophen  325 mg), taking half a tablet four times a day, which alleviates the pain within 30 minutes.  He has chronic obstructive pulmonary disease (COPD) and experiences shortness of breath, especially when walking to his shop. He has reduced his smoking to about ten cigarettes a day. His current medications include Symbicort  and albuterol  inhalers, and he performs albuterol  nebulizer treatments twice a week. He was previously using hypertonic saline with the nebulizer but has reduced its use due to the time it takes for treatment.  He is on tamsulosin  for benign prostatic hyperplasia (BPH), which helps with urinary flow. He previously tried finasteride  but discontinued it due to lack of benefit.  He takes duloxetine  (Cymbalta ) twice daily for depression and anxiety, which he feels is effective.  He mentions a hernia that is not currently bothersome. He has a history of undergoing a lung procedure in March, which required anesthesia.          06/23/2024   12:59 PM 03/03/2024   12:31 PM 12/14/2023    1:00 PM  Depression screen PHQ 2/9  Decreased Interest 0 0 0  Down, Depressed, Hopeless 0 0 0  PHQ - 2 Score 0 0 0  Altered sleeping 0 0 0  Tired, decreased energy 1 1 1   Change in appetite 0 0 0   Feeling bad or failure about yourself  0 0 0  Trouble concentrating 0 0 0  Moving slowly or fidgety/restless 0 0 0  Suicidal thoughts 0 0 0  PHQ-9 Score 1 1 1   Difficult doing work/chores Not difficult at all Not difficult at all     History Joshua Bowman has a past medical history of Anxiety, Arthritis, Asthma, Bilateral carotid artery stenosis, Colon polyps - attenuated polyposis syndrome, COPD (chronic obstructive pulmonary disease) (HCC), Duodenitis, Gastritis, Hemorrhoids, Hyperlipidemia, Hyperplasia of prostate, Hypertension, IBS (irritable bowel syndrome), Pre-diabetes, and Sigmoid diverticulosis.   He has a past surgical history that includes Appendectomy (1969); nasal surgery ( spurs ) (70's); lipoma rt shoulder (01/2001); rt. shoulder -rotator cuff repair (08/2005); Colonoscopy; Esophagogastroduodenoscopy; Tonsillectomy; Eye surgery; Cataract extraction (Right); Video bronchoscopy (N/A, 12/17/2023); and Bronchial washings (12/17/2023).   His family history includes Arthritis in his sister; Congestive Heart Failure in his father; Heart attack in his brother; Heart disease in his brother and mother; Post-traumatic stress disorder in his father; Prostate cancer in his brother.He reports that he has been smoking cigarettes. He started smoking about 43 years ago. He has a 46 pack-year smoking history. He has quit using smokeless tobacco. He reports that he does not currently use alcohol. He reports that he does not use drugs.    ROS Review of Systems  Constitutional:  Negative for fever.  Respiratory:  Negative for shortness of breath.  Cardiovascular:  Negative for chest pain.  Musculoskeletal:  Negative for arthralgias.  Skin:  Negative for rash.    Objective:  BP 98/66   Pulse 85   Temp 98.3 F (36.8 C)   Ht 5' 6 (1.676 m)   Wt 143 lb (64.9 kg)   SpO2 93%   BMI 23.08 kg/m   BP Readings from Last 3 Encounters:  06/23/24 98/66  03/23/24 117/61  03/03/24 115/69    Wt  Readings from Last 3 Encounters:  06/23/24 143 lb (64.9 kg)  03/23/24 145 lb (65.8 kg)  03/03/24 149 lb (67.6 kg)     Physical Exam Physical Exam GENERAL: Alert, cooperative, well developed, no acute distress HEENT: Normocephalic, normal oropharynx, moist mucous membranes CHEST: Wheezing present, otherwise clear to auscultation bilaterally, no rhonchi or crackles CARDIOVASCULAR: Normal heart rate and rhythm, S1 and S2 normal without murmurs ABDOMEN: Soft, non-tender, non-distended, without organomegaly, normal bowel sounds EXTREMITIES: No cyanosis or edema NEUROLOGICAL: Cranial nerves grossly intact, moves all extremities without gross motor or sensory deficit   Assessment & Plan:  There are no diagnoses linked to this encounter.  Assessment and Plan Assessment & Plan COPD, GOLD 3 with chronic bronchitis features   He experiences shortness of breath with minimal exertion, such as walking to his shop. Current treatment includes Symbicort  and albuterol  inhalers, and albuterol  nebulizer treatments, which have been reduced to twice a week or as needed, but shortness of breath persists. He needs to use albuterol  more frequently due to persistent symptoms. He is advised to consult with a pulmonologist regarding hypertonic saline use. Continue Symbicort  and albuterol  inhalers as prescribed. Use albuterol  nebulizer treatments more frequently as needed for shortness of breath. Add Spiriva  inhaler, one puff once daily, preferably in the morning. Discuss with a pulmonologist regarding the use of hypertonic saline nebulizer treatment, as it is not on the current medication list.  Lumbar herniated disc with chronic back pain   Chronic back pain is exacerbated by physical activity such as weed eating, with pain reaching 8/10 after activity and reducing to 4-5/10 with rest and medication. Pain is managed with hydrocodone -acetaminophen , providing relief within 30 minutes. Continue  hydrocodone -acetaminophen  0.5 tablets orally every 6 hours as needed for moderate pain. Provide prescriptions with post-dated refills for October 25 and November 24.  Nicotine  dependence, cigarettes   He consumes approximately 10 cigarettes per day. The previous plan to use nicotine  patches has not been initiated. He is encouraged to reduce smoking further and utilize prescribed nicotine  patches to aid cessation. Encourage reduction of cigarette consumption. Pick up and use nicotine  patches from the pharmacy to aid in smoking cessation.  Benign prostatic hyperplasia with lower urinary tract symptoms   Benign prostatic hyperplasia is managed with tamsulosin , providing adequate relief of urinary symptoms. Finasteride  was previously trialed but discontinued due to lack of additional benefit. Continue tamsulosin  as prescribed. Discontinue finasteride .  Depression   Depression is managed with duloxetine  (Cymbalta ), taken twice daily, which is helping with symptoms. Continue duloxetine  (Cymbalta ) twice daily.  Hyperlipidemia   He is due for routine blood work to monitor cholesterol levels, kidney and liver functions, and blood counts. Order blood work to check cholesterol levels, kidney and liver functions, and blood counts.  Inguinal hernia (not currently symptomatic)   An inguinal hernia is present but not currently symptomatic. He is considering surgical intervention but is concerned about risks due to respiratory issues. Referral to a surgeon is planned, with clearance required from a pulmonologist. Discussed the importance  of having a pulmonologist available during surgery due to high risk from respiratory issues. Refer to [facility] for surgical evaluation. Obtain clearance from pulmonologist Dr. Peg before proceeding with surgery.  General Health Maintenance   He is due for an annual influenza vaccination. Administer influenza vaccination.       Follow-up: No follow-ups on  file.  Butler Der, M.D.

## 2024-06-24 ENCOUNTER — Encounter: Payer: Self-pay | Admitting: Family Medicine

## 2024-06-24 ENCOUNTER — Ambulatory Visit: Payer: Self-pay | Admitting: Family Medicine

## 2024-06-24 LAB — CBC WITH DIFFERENTIAL/PLATELET
Basophils Absolute: 0.1 x10E3/uL (ref 0.0–0.2)
Basos: 1 %
EOS (ABSOLUTE): 0.1 x10E3/uL (ref 0.0–0.4)
Eos: 1 %
Hematocrit: 51.5 % — ABNORMAL HIGH (ref 37.5–51.0)
Hemoglobin: 17 g/dL (ref 13.0–17.7)
Immature Grans (Abs): 0 x10E3/uL (ref 0.0–0.1)
Immature Granulocytes: 0 %
Lymphocytes Absolute: 1.7 x10E3/uL (ref 0.7–3.1)
Lymphs: 21 %
MCH: 32.1 pg (ref 26.6–33.0)
MCHC: 33 g/dL (ref 31.5–35.7)
MCV: 97 fL (ref 79–97)
Monocytes Absolute: 0.8 x10E3/uL (ref 0.1–0.9)
Monocytes: 10 %
Neutrophils Absolute: 5.2 x10E3/uL (ref 1.4–7.0)
Neutrophils: 67 %
Platelets: 260 x10E3/uL (ref 150–450)
RBC: 5.29 x10E6/uL (ref 4.14–5.80)
RDW: 12.4 % (ref 11.6–15.4)
WBC: 7.8 x10E3/uL (ref 3.4–10.8)

## 2024-06-24 LAB — CMP14+EGFR
ALT: 10 IU/L (ref 0–44)
AST: 13 IU/L (ref 0–40)
Albumin: 3.8 g/dL (ref 3.8–4.8)
Alkaline Phosphatase: 59 IU/L (ref 47–123)
BUN/Creatinine Ratio: 19 (ref 10–24)
BUN: 16 mg/dL (ref 8–27)
Bilirubin Total: 0.3 mg/dL (ref 0.0–1.2)
CO2: 26 mmol/L (ref 20–29)
Calcium: 9.5 mg/dL (ref 8.6–10.2)
Chloride: 98 mmol/L (ref 96–106)
Creatinine, Ser: 0.85 mg/dL (ref 0.76–1.27)
Globulin, Total: 2.1 g/dL (ref 1.5–4.5)
Glucose: 94 mg/dL (ref 70–99)
Potassium: 4.8 mmol/L (ref 3.5–5.2)
Sodium: 136 mmol/L (ref 134–144)
Total Protein: 5.9 g/dL — AB (ref 6.0–8.5)
eGFR: 89 mL/min/1.73 (ref >59)

## 2024-06-24 LAB — LIPID PANEL
Cholesterol, Total: 163 mg/dL (ref 100–199)
HDL: 70 mg/dL (ref >39)
LDL CALC COMMENT:: 2.3 ratio (ref 0.0–5.0)
LDL Chol Calc (NIH): 81 mg/dL (ref 0–99)
Triglycerides: 61 mg/dL (ref 0–149)
VLDL Cholesterol Cal: 12 mg/dL (ref 5–40)

## 2024-06-24 NOTE — Progress Notes (Signed)
Hello Ebb,  Your lab result is normal and/or stable.Some minor variations that are not significant are commonly marked abnormal, but do not represent any medical problem for you.  Best regards, Claretta Fraise, M.D.

## 2024-07-20 ENCOUNTER — Ambulatory Visit: Admitting: Family Medicine

## 2024-07-20 ENCOUNTER — Encounter: Payer: Self-pay | Admitting: Family Medicine

## 2024-07-20 ENCOUNTER — Ambulatory Visit (INDEPENDENT_AMBULATORY_CARE_PROVIDER_SITE_OTHER)

## 2024-07-20 DIAGNOSIS — J449 Chronic obstructive pulmonary disease, unspecified: Secondary | ICD-10-CM

## 2024-07-20 DIAGNOSIS — M19012 Primary osteoarthritis, left shoulder: Secondary | ICD-10-CM | POA: Diagnosis not present

## 2024-07-20 DIAGNOSIS — M7542 Impingement syndrome of left shoulder: Secondary | ICD-10-CM | POA: Diagnosis not present

## 2024-07-20 DIAGNOSIS — L821 Other seborrheic keratosis: Secondary | ICD-10-CM | POA: Diagnosis not present

## 2024-07-20 DIAGNOSIS — M25512 Pain in left shoulder: Secondary | ICD-10-CM | POA: Diagnosis not present

## 2024-07-20 MED ORDER — BETAMETHASONE SOD PHOS & ACET 6 (3-3) MG/ML IJ SUSP
6.0000 mg | Freq: Once | INTRAMUSCULAR | Status: AC
  Administered 2024-07-20: 6 mg via INTRAMUSCULAR

## 2024-07-20 MED ORDER — PREDNISONE 10 MG PO TABS: ORAL_TABLET | ORAL | 0 refills | Status: DC

## 2024-07-20 NOTE — Progress Notes (Signed)
 Subjective:  Patient ID: Joshua Bowman, male    DOB: May 18, 1946  Age: 78 y.o. MRN: [id number]  CC: Seborrheic Keratosis (Left shoulder pain)   HPI  Discussed the use of AI scribe software for clinical note transcription with the patient, who gave verbal consent to proceed.  History of Present Illness Joshua Bowman is a 78 year old male who presents with worsening left shoulder pain.  He has been experiencing worsening left shoulder pain for at least a week, possibly longer. A cortisone shot and a prednisone  regimen about a year ago had previously alleviated his symptoms. Recently, increased physical activity has led to a flare-up of the pain. The pain is described as tightening, especially at night, requiring him to use his other hand to move his arm. He reports significant difficulty with rotation and reaching movements, such as reaching for a seatbelt.  He continues to take hydrocodone  daily at a low dose for back pain, which he finds minimally effective at night. He also uses Spiriva  in the morning and Symbicort  twice daily for breathing issues, which he notes has improved his breathing.  He mentions a spot on his back that his wife noticed, described as a dried spot, but he does not recall discussing it with anyone previously. The right shoulder, which had rotator cuff surgery approximately twenty years ago, occasionally aches but is not as painful as the left shoulder.          07/20/2024    2:30 PM 06/23/2024   12:59 PM 03/03/2024   12:31 PM  Depression screen PHQ 2/9  Decreased Interest 0 0 0  Down, Depressed, Hopeless 0 0 0  PHQ - 2 Score 0 0 0  Altered sleeping  0 0  Tired, decreased energy  1 1  Change in appetite  0 0  Feeling bad or failure about yourself   0 0  Trouble concentrating  0 0  Moving slowly or fidgety/restless  0 0  Suicidal thoughts  0 0  PHQ-9 Score  1 1  Difficult doing work/chores Not difficult at all Not difficult at all Not difficult at all     History Ibn has a past medical history of Anxiety, Arthritis, Asthma, Bilateral carotid artery stenosis, Colon polyps - attenuated polyposis syndrome, COPD (chronic obstructive pulmonary disease) (HCC), Duodenitis, Gastritis, Hemorrhoids, Hyperlipidemia, Hyperplasia of prostate, Hypertension, IBS (irritable bowel syndrome), Pre-diabetes, and Sigmoid diverticulosis.   He has a past surgical history that includes Appendectomy (1969); nasal surgery ( spurs ) (70's); lipoma rt shoulder (01/2001); rt. shoulder -rotator cuff repair (08/2005); Colonoscopy; Esophagogastroduodenoscopy; Tonsillectomy; Eye surgery; Cataract extraction (Right); Video bronchoscopy (N/A, 12/17/2023); and Bronchial washings (12/17/2023).   His family history includes Arthritis in his sister; Congestive Heart Failure in his father; Heart attack in his brother; Heart disease in his brother and mother; Post-traumatic stress disorder in his father; Prostate cancer in his brother.He reports that he has been smoking cigarettes. He started smoking about 43 years ago. He has a 46.1 pack-year smoking history. He has quit using smokeless tobacco. He reports that he does not currently use alcohol. He reports that he does not use drugs.    ROS Review of Systems  Constitutional:  Negative for fever.  Respiratory:  Positive for shortness of breath (with exertion).   Cardiovascular:  Negative for chest pain.  Musculoskeletal:  Positive for arthralgias and back pain.  Skin:  Positive for rash (multiple seb K).    Objective:  BP 109/68   Pulse  78   Temp 98.3 F (36.8 C)   Ht 5' 6 (1.676 m)   Wt 144 lb (65.3 kg)   SpO2 91%   BMI 23.24 kg/m   BP Readings from Last 3 Encounters:  07/20/24 109/68  06/23/24 98/66  03/23/24 117/61    Wt Readings from Last 3 Encounters:  07/20/24 144 lb (65.3 kg)  06/23/24 143 lb (64.9 kg)  03/23/24 145 lb (65.8 kg)     Physical Exam Physical Exam GENERAL: Alert, cooperative, well  developed, no acute distress. HEENT: Normocephalic, normal oropharynx, moist mucous membranes. CHEST: Clear to auscultation bilaterally, no wheezes, rhonchi, or crackles. CARDIOVASCULAR: Normal heart rate and rhythm, S1 and S2 normal without murmurs. ABDOMEN: Soft, non-tender, non-distended, without organomegaly, normal bowel sounds. EXTREMITIES: No cyanosis or edema. MUSCULOSKELETAL: Left shoulder flexion and abduction normal; rotation limited and painful. NEUROLOGICAL: Cranial nerves grossly intact, moves all extremities without gross motor or sensory deficit. SKIN: Severe keratosis on back, harmless.   Assessment & Plan:  Rotator cuff impingement syndrome of left shoulder -     DG Shoulder Left; Future -     Betamethasone  Sod Phos & Acet  COPD GOLD 3  with CB features  Seborrheic keratoses  Other orders -     predniSONE ; Take 5 daily for 2 days followed by 4,3,2 and 1 for 2 days each.  Dispense: 30 tablet; Refill: 0    Assessment and Plan Assessment & Plan Left shoulder pain with limited rotation   Left shoulder pain has persisted for at least a week, with severe discomfort at night, limiting activities like reaching for a seatbelt. Flexion and abduction are adequate, but rotation is limited and painful. A previous cortisone shot and prednisone  regimen were effective. Increased activity may have contributed to the flare-up. Order an x-ray of the left shoulder. Administer a cortisone injection into the muscle after the x-ray. Prescribe a prednisone  regimen.  Chronic back pain   Chronic back pain is managed with low-dose hydrocodone . Pain persists at night and is not alleviated by the current medication regimen.  Chronic obstructive pulmonary disease (COPD)   COPD is managed with Spiriva  and Symbicort  inhalers. Breathing has improved with the current regimen. Prednisone  prescribed for shoulder pain may also benefit breathing. Continue Spiriva  and Symbicort  inhalers.  Seborrheic  keratosis of back   Seborrheic keratosis on the back is identified as harmless. Removal is optional and may not be covered by insurance if deemed cosmetic. Offer cryotherapy for seborrheic keratosis if desired.       Follow-up: Return in about 3 months (around 10/20/2024).  Butler Der, M.D.

## 2024-07-23 ENCOUNTER — Other Ambulatory Visit: Payer: Self-pay | Admitting: Cardiology

## 2024-07-26 ENCOUNTER — Encounter: Payer: Self-pay | Admitting: Family Medicine

## 2024-08-11 ENCOUNTER — Encounter: Payer: Self-pay | Admitting: *Deleted

## 2024-08-15 ENCOUNTER — Ambulatory Visit: Admitting: Internal Medicine

## 2024-08-15 ENCOUNTER — Encounter: Payer: Self-pay | Admitting: Internal Medicine

## 2024-08-15 VITALS — BP 110/60 | HR 80 | Ht 66.0 in | Wt 146.0 lb

## 2024-08-15 DIAGNOSIS — J449 Chronic obstructive pulmonary disease, unspecified: Secondary | ICD-10-CM | POA: Diagnosis not present

## 2024-08-15 DIAGNOSIS — R9389 Abnormal findings on diagnostic imaging of other specified body structures: Secondary | ICD-10-CM | POA: Diagnosis not present

## 2024-08-15 DIAGNOSIS — F17210 Nicotine dependence, cigarettes, uncomplicated: Secondary | ICD-10-CM | POA: Diagnosis not present

## 2024-08-15 NOTE — Progress Notes (Unsigned)
 Joshua Bowman, male    DOB: [birth year]    MRN: [id number]   Brief patient profile:  59  yowm  active smoker/MM  referred to pulmonary clinic in [place]  11/11/2022 by Butler Der for LDSCT showing ? 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 with CB symptoms worse x 2022   History of Present Illness  11/11/2022  Pulmonary/ 1st office eval/ Joshua Bowman / [place] [facility]  Chief Complaint  Patient presents with   New Patient (Initial Visit)    Saw SG NP for LCS    Dyspnea:  [employer] walks slower than avg pushes cart / no hc parking Cough: thick white, never bloody p coffee seems worse / rattles at bedtime  Sleep: does not disturb sleep  SABA use: 2-3 x per week  02: none Rec Prednisone  10 mg take  4 each am x 2 days,  2 each am x 2 days,  1 each am x 2 days and stop  Continue Symbicort  160  but you must work on inhaler technique:   For cough > mucinex 1200 mg twice daily with a glass of water  and use the flutter valve as much as your can  The key is to stop smoking completely before smoking completely stops you! Please schedule a follow up office visit in 6 weeks, call sooner if needed with all medications /inhalers/ solutions in hand         07/20/2023  f/u ov/[place] office/Joshua Bowman re: GOLD 3 copd/cb  maint on symbicort    Chief Complaint  Patient presents with   COPD   Dyspnea:  MMRC2 = can't walk a nl pace on a flat grade s sob but does fine slow and flat walking fast or uphills  Cough: comes and goes > min mucoid  Sleeping: bed is flat one side / one pillow s    resp cc  SABA use: avg 3 x weekly  02: none  Rec No change in medications  Please schedule a follow up visit in 6  months but call sooner if needed   FOB 12/17/23 severe tracheobronchomalacia and architectural distortion of the airway beginning at the trachea and extending into the segmental bronchi. There was significant malacia in the bronchus intermedius and into the RML bronchus. The airway mucosa was noted to be  irritated and friable. The malacia in the RML was bypassed and evaluated with no stenosis or endobronchial lesions. There was a significant amount of thick secretions that were therapeutically aspirated and cleared out of the airway. A BAL was performed in the RML and sent for culture: nl flora   Rec Hypertonic Saline and alb qid    02/01/2024  f/u ov/[place] office/Joshua Bowman re: GOLD 3 copd/ cb  maint on symbicort  160 and started  hypertonic saline f/b albuterol   but not sure how often to use it  Chief Complaint  Patient presents with   Shortness of Breath   Cough  Dyspnea:  no change  Cough: 2-3 x per day > creamy  Sleeping: flat / one pillow s   resp cc  SABA use: once or twice  02: none  Rec Symbicort  160 Take 2 puffs first thing in am and then another 2 puffs about 12 hours later.  As needed for cough or breathing  >  try albuterol  inhaler 1st x 2 puffs up to every 4 hours  If cough/ congested > saline followed by the nebulizer up to every 4 hours  Also  Ok to try albuterol  15  min before an activity (on alternating days with inhaler then nebulizer )  that you know would usually make you short of breath  Please schedule a follow up visit in 6 months but call sooner if needed  with all inhalers/ solutions in hand so we can verify exactly what you are taking.      08/15/2024  f/u ov/[place] office/Joshua Bowman re: GOLD 3 COPD/ CB  maint on symb/spiriva    still smoking 8 cigs per day  No chief complaint on file. Dyspnea:  fine slow and level = MMRC2 = can't walk a nl pace on a flat grade s sob but does fine slow and flat   Cough: worse in am x 3-4 coughs  over 30 min  Sleeping: flat bed / one pillow  s  resp cc  SABA use: only p exercise sev times a week neb also  02: none    No obvious day to day or daytime variability or assoc excess/ purulent sputum or mucus plugs or hemoptysis or cp or chest tightness, subjective wheeze or overt sinus or hb symptoms.    Also denies any obvious  fluctuation of symptoms with weather or environmental changes or other aggravating or alleviating factors except as outlined above   No unusual exposure hx or h/o childhood pna/ asthma or knowledge of premature birth.  Current Allergies, Complete Past Medical History, Past Surgical History, Family History, and Social History were reviewed in [employer] record.  ROS  The following are not active complaints unless bolded Hoarseness, sore throat, dysphagia, dental problems, itching, sneezing,  nasal congestion or discharge of excess mucus or purulent secretions, ear ache,   fever, chills, sweats, unintended wt loss or wt gain, classically pleuritic or exertional cp,  orthopnea pnd or arm/hand swelling  or leg swelling, presyncope, palpitations, abdominal pain, anorexia, nausea, vomiting, diarrhea  or change in bowel habits or change in bladder habits, change in stools or change in urine, dysuria, hematuria,  rash, arthralgias, visual complaints, headache, numbness, weakness or ataxia or problems with walking or coordination,  change in mood or  memory.        No outpatient medications have been marked as taking for the 08/15/24 encounter (Appointment) with Darlean Ozell NOVAK, MD.                      Past Medical History:  Diagnosis Date   Anxiety    Arthritis    Bilateral carotid artery stenosis    Minimal 2012    Colon polyps - attenuated polyposis syndrome    multiple    COPD (chronic obstructive pulmonary disease) (HCC)    Duodenitis    Gastritis    Hemorrhoids    Hyperlipidemia    Hyperplasia of prostate    IBS (irritable bowel syndrome)    Pre-diabetes    Sigmoid diverticulosis    mild         Objective:    Wts  08/15/2024       ***  02/01/2024         149  07/20/2023     151   12/25/22 154 lb 12.8 oz (70.2 kg)  12/10/22 154 lb 12.8 oz (70.2 kg)  11/11/22 153 lb 12.8 oz (69.8 kg)     Vital signs reviewed  08/15/2024  - Note at rest 02 sats   ***% on ***   General appearance:    pleasant amb eldelry wm / congested cough  LUNGS: no acc muscle use,  Mod barre *** mild isnp/exp rhonchi? Mild clubbign             Assessment

## 2024-08-15 NOTE — Patient Instructions (Addendum)
 My office will be contacting you by phone for referral  for CT chest  follow up  - if you don't hear back from my office within one week please call us  back or notify us  thru MyChart and we'll address it right away.     The key is to stop smoking completely before smoking completely stops you!  Please schedule a follow up visit in 6 months but call sooner if needed  with all medications /inhalers/ solutions in hand so we can verify exactly what you are taking. This includes all medications from all doctors and over the counters

## 2024-08-16 NOTE — Assessment & Plan Note (Addendum)
 Active smoker/MM - Labs ordered 11/11/2022  :  allergy screen Eos 0.1  alpha one AT phenotype MM level 171  - 11/11/2022  After extensive coaching inhaler device,  effectiveness =    75% from baseline 25% > continue symbicort  160 2bid/ mucinex/ flutter valve ordered   - 11/11/2022   Walked on RA  x  3  lap(s) =  approx 450  ft  @ slow pace, stopped due to end of study with lowest 02 sats 91% and mild sob   - PFT's  02/03/23  FEV1 1.13  (45 % ) ratio 0.29   p 26 % improvement from saba p ?symb?  prior to study with DLCO  13 (60%%)   and FV curve classic curvature    - CT chest 12/16/22 mild emphysema  - 12/17/2023  FOB c/w severe trachomalacia rx hypertonic saline f/b albuterol  up to qid prn    Group E in terms of symptoms/risk so  laba/lama/ICS  therefore appropriate rx at this point >>>  symb 160/ spiriva  2.5 and more   and approp SABA prn.  Re SABA :  I spent extra time with pt today reviewing appropriate use of albuterol  for prn use on exertion with the following points: 1) saba is for relief of sob that does not improve by walking a slower pace or resting but rather if the pt does not improve after trying this first. 2) If the pt is convinced, as many are, that saba helps recover from activity faster then it's easy to tell if this is the case by re-challenging : ie stop, take the inhaler, then p 5 minutes try the exact same activity (intensity of workload) that just caused the symptoms and see if they are substantially diminished or not after saba 3) if there is an activity that reproducibly causes the symptoms, try the saba 15 min before the activity on alternate days   If in fact the saba really does help, then fine to continue to use it prn but advised may need to look closer at the maintenance regimen being used to achieve better control of airways disease with exertion.

## 2024-08-16 NOTE — Assessment & Plan Note (Addendum)
 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      Each maintenance medication was reviewed in detail including emphasizing most importantly the difference between maintenance and prns and under what circumstances the prns are to be triggered using an action plan format where appropriate.  Total time for H and P, chart review, counseling, reviewing hfa/ smi/ flutter/ neb   device(s) and generating customized AVS unique to this office visit / same day charting = 33 min

## 2024-08-16 NOTE — Assessment & Plan Note (Addendum)
 Counseled re importance of smoking cessation but did not meet time criteria for separate billing   >>> advised this was the most important aspect of managing his problems with Mucociliary clearance.

## 2024-08-17 ENCOUNTER — Other Ambulatory Visit: Payer: Self-pay | Admitting: *Deleted

## 2024-08-17 DIAGNOSIS — J398 Other specified diseases of upper respiratory tract: Secondary | ICD-10-CM

## 2024-08-17 DIAGNOSIS — J418 Mixed simple and mucopurulent chronic bronchitis: Secondary | ICD-10-CM

## 2024-08-17 DIAGNOSIS — R9389 Abnormal findings on diagnostic imaging of other specified body structures: Secondary | ICD-10-CM

## 2024-08-19 ENCOUNTER — Ambulatory Visit (HOSPITAL_COMMUNITY)

## 2024-08-29 ENCOUNTER — Other Ambulatory Visit: Payer: Self-pay | Admitting: Family Medicine

## 2024-08-29 DIAGNOSIS — F339 Major depressive disorder, recurrent, unspecified: Secondary | ICD-10-CM

## 2024-08-29 DIAGNOSIS — E785 Hyperlipidemia, unspecified: Secondary | ICD-10-CM

## 2024-09-07 ENCOUNTER — Ambulatory Visit (HOSPITAL_COMMUNITY)
Admission: RE | Admit: 2024-09-07 | Discharge: 2024-09-07 | Disposition: A | Source: Ambulatory Visit | Attending: Internal Medicine | Admitting: Internal Medicine

## 2024-09-07 DIAGNOSIS — J398 Other specified diseases of upper respiratory tract: Secondary | ICD-10-CM | POA: Diagnosis present

## 2024-09-07 DIAGNOSIS — J418 Mixed simple and mucopurulent chronic bronchitis: Secondary | ICD-10-CM | POA: Diagnosis present

## 2024-09-07 DIAGNOSIS — R9389 Abnormal findings on diagnostic imaging of other specified body structures: Secondary | ICD-10-CM | POA: Diagnosis present

## 2024-09-09 ENCOUNTER — Other Ambulatory Visit: Payer: Self-pay | Admitting: Family Medicine

## 2024-09-12 ENCOUNTER — Encounter: Payer: Self-pay | Admitting: Family Medicine

## 2024-09-12 ENCOUNTER — Ambulatory Visit: Payer: Self-pay | Admitting: Family Medicine

## 2024-09-12 VITALS — BP 109/67 | HR 78 | Temp 97.5°F | Ht 66.0 in | Wt 150.0 lb

## 2024-09-12 DIAGNOSIS — M7542 Impingement syndrome of left shoulder: Secondary | ICD-10-CM

## 2024-09-12 DIAGNOSIS — Z79899 Other long term (current) drug therapy: Secondary | ICD-10-CM

## 2024-09-12 DIAGNOSIS — F17210 Nicotine dependence, cigarettes, uncomplicated: Secondary | ICD-10-CM

## 2024-09-12 DIAGNOSIS — J449 Chronic obstructive pulmonary disease, unspecified: Secondary | ICD-10-CM

## 2024-09-12 DIAGNOSIS — M5126 Other intervertebral disc displacement, lumbar region: Secondary | ICD-10-CM

## 2024-09-12 MED ORDER — NICOTINE 14 MG/24HR TD PT24: 14.0000 mg | MEDICATED_PATCH | Freq: Every day | TRANSDERMAL | 3 refills | Status: AC

## 2024-09-12 MED ORDER — HYDROCODONE-ACETAMINOPHEN 5-325 MG PO TABS: 0.5000 | ORAL_TABLET | Freq: Four times a day (QID) | ORAL | 0 refills | Status: AC | PRN

## 2024-09-12 MED ORDER — BETAMETHASONE SOD PHOS & ACET 6 (3-3) MG/ML IJ SUSP
6.0000 mg | Freq: Once | INTRAMUSCULAR | Status: AC
  Administered 2024-09-12: 15:00:00 6 mg via INTRAMUSCULAR

## 2024-09-12 NOTE — Progress Notes (Signed)
 Subjective:  Patient ID: Joshua Bowman, male    DOB: March 03, 1946  Age: 78 y.o. MRN: [id number]  CC: Shoulder Pain (Left shoulder pain /Would like cortisone shot if possible. ) and COPD (Has occasional flare ups. Worse when he first wakes up.)   HPI  Discussed the use of AI scribe software for clinical note transcription with the patient, who gave verbal consent to proceed.  History of Present Illness Joshua Bowman is a 78 year old male with chronic pain and COPD who presents for medication refills and a cortisone shot in his left shoulder.  He requests a cortisone shot in his left shoulder due to increased pain, which has worsened with the cold weather, particularly aching last night. He managed the pain with Biofreeze. A previous cortisone shot in mid-October provided temporary relief.  He experiences COPD flare-ups, especially in the mornings, characterized by congestion that improves with coffee and activity. His current medications include Spiriva  once daily in the morning, Symbicort  twice daily, and occasional albuterol  use before bed. He continues to smoke, though less than before.  He has chronic back pain, generally well-managed with his current pain medication regimen of half a tablet four times a day. The pain increases with exertion, such as putting up Christmas decorations, and he uses a heating pad in the mornings to alleviate discomfort.  A recent chest scan was performed on December 10th. He also reports that the previous cortisone shot helped him sleep better by reducing bronchial rattling when lying down.          09/12/2024    2:35 PM 07/20/2024    2:30 PM 06/23/2024   12:59 PM  Depression screen PHQ 2/9  Decreased Interest 0 0 0  Down, Depressed, Hopeless 0 0 0  PHQ - 2 Score 0 0 0  Altered sleeping 0  0  Tired, decreased energy 1  1  Change in appetite 0  0  Feeling bad or failure about yourself  0  0  Trouble concentrating 0  0  Moving slowly or  fidgety/restless 0  0  Suicidal thoughts 0  0  PHQ-9 Score 1  1   Difficult doing work/chores Somewhat difficult Not difficult at all Not difficult at all     Data saved with a previous flowsheet row definition    History Blessing has a past medical history of Anxiety, Arthritis, Asthma, Bilateral carotid artery stenosis, Colon polyps - attenuated polyposis syndrome, COPD (chronic obstructive pulmonary disease) (HCC), Duodenitis, Gastritis, Hemorrhoids, Hyperlipidemia, Hyperplasia of prostate, Hypertension, IBS (irritable bowel syndrome), Pre-diabetes, and Sigmoid diverticulosis.   He has a past surgical history that includes Appendectomy (1969); nasal surgery ( spurs ) (70's); lipoma rt shoulder (01/2001); rt. shoulder -rotator cuff repair (08/2005); Colonoscopy; Esophagogastroduodenoscopy; Tonsillectomy; Eye surgery; Cataract extraction (Right); Video bronchoscopy (N/A, 12/17/2023); and Bronchial washings (12/17/2023).   His family history includes Arthritis in his sister; Congestive Heart Failure in his father; Heart attack in his brother; Heart disease in his brother and mother; Post-traumatic stress disorder in his father; Prostate cancer in his brother.He reports that he has been smoking cigarettes. He started smoking about 43 years ago. He has a 46.3 pack-year smoking history. He has quit using smokeless tobacco. He reports that he does not currently use alcohol. He reports that he does not use drugs.    ROS Review of Systems  Constitutional: Negative.   HENT: Negative.    Eyes:  Negative for visual disturbance.  Respiratory:  Negative for cough  and shortness of breath.   Cardiovascular:  Negative for chest pain and leg swelling.  Gastrointestinal:  Negative for abdominal pain, diarrhea, nausea and vomiting.  Genitourinary:  Negative for difficulty urinating.  Musculoskeletal:  Positive for arthralgias (left shoulder) and back pain. Negative for myalgias.  Skin:  Negative for rash.   Neurological:  Negative for headaches.  Psychiatric/Behavioral:  Positive for sleep disturbance.     Objective:  BP 109/67   Pulse 78   Temp (!) 97.5 F (36.4 C)   Ht 5' 6 (1.676 m)   Wt 150 lb (68 kg)   SpO2 90%   BMI 24.21 kg/m   BP Readings from Last 3 Encounters:  09/12/24 109/67  08/15/24 110/60  07/20/24 109/68    Wt Readings from Last 3 Encounters:  09/12/24 150 lb (68 kg)  08/15/24 146 lb (66.2 kg)  07/20/24 144 lb (65.3 kg)     Physical Exam Physical Exam GENERAL: Alert, cooperative, well developed, no acute distress HEENT: Normocephalic, normal oropharynx, moist mucous membranes CHEST: Clear to auscultation bilaterally, No wheezes, rhonchi, or crackles CARDIOVASCULAR: Normal heart rate and rhythm, S1 and S2 normal without murmurs ABDOMEN: Soft, non-tender, non-distended, without organomegaly, Normal bowel sounds EXTREMITIES: No cyanosis or edema NEUROLOGICAL: Cranial nerves grossly intact, Moves all extremities without gross motor or sensory deficit   Assessment & Plan:  Lumbar herniated disc -     HYDROcodone -Acetaminophen ; Take 0.5 tablets by mouth every 6 (six) hours as needed for moderate pain (pain score 4-6).  Dispense: 60 tablet; Refill: 0 -     HYDROcodone -Acetaminophen ; Take 0.5 tablets by mouth every 6 (six) hours as needed for moderate pain (pain score 4-6).  Dispense: 60 tablet; Refill: 0 -     HYDROcodone -Acetaminophen ; Take 0.5 tablets by mouth every 6 (six) hours as needed for moderate pain (pain score 4-6).  Dispense: 60 tablet; Refill: 0  Rotator cuff impingement syndrome of left shoulder -     Betamethasone  Sod Phos & Acet  Controlled substance agreement signed -     HYDROcodone -Acetaminophen ; Take 0.5 tablets by mouth every 6 (six) hours as needed for moderate pain (pain score 4-6).  Dispense: 60 tablet; Refill: 0 -     HYDROcodone -Acetaminophen ; Take 0.5 tablets by mouth every 6 (six) hours as needed for moderate pain (pain score  4-6).  Dispense: 60 tablet; Refill: 0 -     HYDROcodone -Acetaminophen ; Take 0.5 tablets by mouth every 6 (six) hours as needed for moderate pain (pain score 4-6).  Dispense: 60 tablet; Refill: 0  COPD GOLD 3  with CB features  Cigarette smoker  Other orders -     Nicotine ; Place 1 patch (14 mg total) onto the skin daily.  Dispense: 28 patch; Refill: 3    Assessment and Plan Assessment & Plan Rotator cuff impingement syndrome of left shoulder   He experiences increased pain in the left shoulder, worsened by cold weather. A previous cortisone injection provided relief, and the current pain is localized to the muscle, not the joint. Administered a cortisone injection in the muscle of the left shoulder.  Chronic low back pain with lumbar disc displacement   His chronic low back pain is well-managed with the current medication regimen, though it occasionally flares with exertion, such as during physical activities like decorating for Christmas. Continue the current pain management regimen with hydrocodone . The next pain medication refill is scheduled for January 23rd.  Chronic obstructive pulmonary disease (COPD)   He experiences intermittent flare-ups, particularly in the  mornings. He uses Symbicort  and Spiriva  inhalers regularly, and albuterol  as needed, especially before bed. Smoking cessation efforts are ongoing, with some reduction in smoking. Continue Symbicort  inhaler twice daily and Spiriva  inhaler once daily. Use albuterol  inhaler as needed. Nicotine  patches have been reordered to support smoking cessation. Consider Tylenol  PM for sleep if needed.  Nicotine  dependence, cigarettes   He continues to smoke but has reduced usage. He is not currently using nicotine  patches but plans to resume. Nicotine  patches have been reordered to support smoking cessation.       Follow-up: Return in about 3 months (around 12/11/2024).  Butler Der, M.D.

## 2024-09-18 ENCOUNTER — Ambulatory Visit: Payer: Self-pay | Admitting: Internal Medicine

## 2024-09-19 NOTE — Progress Notes (Signed)
 Atc x1 lmtcb - sent mychart   Pt need 6 month f/u schedule

## 2024-09-26 ENCOUNTER — Ambulatory Visit: Admitting: Family Medicine

## 2024-12-12 ENCOUNTER — Ambulatory Visit: Admitting: Family Medicine

## 2025-02-13 ENCOUNTER — Ambulatory Visit: Admitting: Internal Medicine
